# Patient Record
Sex: Male | Born: 1940 | Race: White | Hispanic: No | Marital: Married | Smoking: Former smoker
Health system: Southern US, Community
[De-identification: ages and names within clinical notes are randomized; demographics above are authoritative.]

## PROBLEM LIST (undated history)

## (undated) DIAGNOSIS — M1990 Unspecified osteoarthritis, unspecified site: Secondary | ICD-10-CM

## (undated) DIAGNOSIS — I060 Rheumatic aortic stenosis: Secondary | ICD-10-CM

## (undated) DIAGNOSIS — G4730 Sleep apnea, unspecified: Secondary | ICD-10-CM

## (undated) DIAGNOSIS — I10 Essential (primary) hypertension: Secondary | ICD-10-CM

## (undated) DIAGNOSIS — D72819 Decreased white blood cell count, unspecified: Secondary | ICD-10-CM

## (undated) DIAGNOSIS — I509 Heart failure, unspecified: Secondary | ICD-10-CM

## (undated) DIAGNOSIS — N289 Disorder of kidney and ureter, unspecified: Secondary | ICD-10-CM

## (undated) DIAGNOSIS — E119 Type 2 diabetes mellitus without complications: Secondary | ICD-10-CM

## (undated) DIAGNOSIS — I739 Peripheral vascular disease, unspecified: Secondary | ICD-10-CM

## (undated) HISTORY — PX: CORONARY ANGIOPLASTY WITH STENT PLACEMENT: SHX49

## (undated) HISTORY — PX: APPENDECTOMY: SHX54

## (undated) HISTORY — DX: Peripheral vascular disease, unspecified: I73.9

## (undated) HISTORY — DX: Essential (primary) hypertension: I10

## (undated) HISTORY — DX: Sleep apnea, unspecified: G47.30

## (undated) HISTORY — DX: Type 2 diabetes mellitus without complications: E11.9

## (undated) HISTORY — DX: Rheumatic aortic stenosis: I06.0

## (undated) HISTORY — DX: Unspecified osteoarthritis, unspecified site: M19.90

---

## 2010-01-21 ENCOUNTER — Ambulatory Visit: Payer: Self-pay | Admitting: Surgery

## 2010-01-23 ENCOUNTER — Ambulatory Visit: Payer: Self-pay | Admitting: Surgery

## 2010-11-18 ENCOUNTER — Ambulatory Visit: Payer: Self-pay | Admitting: Emergency Medicine

## 2011-05-12 ENCOUNTER — Ambulatory Visit: Payer: Self-pay | Admitting: Cardiovascular Disease

## 2011-08-19 ENCOUNTER — Ambulatory Visit: Payer: Self-pay | Admitting: Internal Medicine

## 2011-10-26 ENCOUNTER — Ambulatory Visit: Payer: Self-pay | Admitting: Physician Assistant

## 2011-11-11 ENCOUNTER — Ambulatory Visit: Payer: Self-pay | Admitting: Physician Assistant

## 2011-11-27 ENCOUNTER — Ambulatory Visit: Payer: Self-pay | Admitting: Unknown Physician Specialty

## 2012-02-22 ENCOUNTER — Ambulatory Visit: Payer: Self-pay | Admitting: Internal Medicine

## 2012-03-07 ENCOUNTER — Ambulatory Visit: Payer: Self-pay | Admitting: Internal Medicine

## 2012-04-06 ENCOUNTER — Ambulatory Visit: Payer: Self-pay | Admitting: Internal Medicine

## 2012-05-10 ENCOUNTER — Ambulatory Visit: Payer: Self-pay | Admitting: Unknown Physician Specialty

## 2012-10-28 ENCOUNTER — Ambulatory Visit: Payer: Self-pay | Admitting: Internal Medicine

## 2012-11-02 ENCOUNTER — Ambulatory Visit: Payer: Self-pay | Admitting: Internal Medicine

## 2012-11-02 LAB — CBC CANCER CENTER
Comment - H1-Com1: NORMAL
Comment - H1-Com2: NORMAL
Eosinophil: 4 %
HCT: 37.2 % — ABNORMAL LOW (ref 40.0–52.0)
MCH: 31.6 pg (ref 26.0–34.0)
MCHC: 35.7 g/dL (ref 32.0–36.0)
MCV: 89 fL (ref 80–100)
Monocytes: 12 %
RDW: 14.1 % (ref 11.5–14.5)
Segmented Neutrophils: 38 %
Variant Lymphocyte: 10 %

## 2012-11-02 LAB — RETICULOCYTES: Absolute Retic Count: 0.0364 10*6/uL (ref 0.031–0.129)

## 2012-11-02 LAB — IRON AND TIBC
Iron Saturation: 23 %
Iron: 92 ug/dL (ref 65–175)
Unbound Iron-Bind.Cap.: 313 ug/dL

## 2012-11-02 LAB — FOLATE: Folic Acid: 15.9 ng/mL (ref 3.1–100.0)

## 2012-11-03 LAB — PROT IMMUNOELECTROPHORES(ARMC)

## 2012-11-06 ENCOUNTER — Ambulatory Visit: Payer: Self-pay | Admitting: Internal Medicine

## 2012-12-07 ENCOUNTER — Ambulatory Visit: Payer: Self-pay | Admitting: Internal Medicine

## 2013-02-04 ENCOUNTER — Ambulatory Visit: Payer: Self-pay | Admitting: Internal Medicine

## 2013-02-08 LAB — CBC CANCER CENTER
Comment - H1-Com2: NORMAL
Eosinophil: 10 %
HGB: 13 g/dL (ref 13.0–18.0)
Lymphocytes: 31 %
MCH: 31.3 pg (ref 26.0–34.0)
MCHC: 35.7 g/dL (ref 32.0–36.0)
MCV: 88 fL (ref 80–100)
Monocytes: 10 %
Segmented Neutrophils: 45 %
Variant Lymphocyte: 4 %

## 2013-02-23 ENCOUNTER — Ambulatory Visit: Payer: Self-pay | Admitting: Cardiovascular Disease

## 2013-03-07 ENCOUNTER — Ambulatory Visit: Payer: Self-pay | Admitting: Internal Medicine

## 2013-04-06 ENCOUNTER — Ambulatory Visit: Payer: Self-pay | Admitting: Internal Medicine

## 2013-05-03 LAB — CBC CANCER CENTER
Basophil: 3 %
Comment - H1-Com3: NORMAL
HGB: 11.7 g/dL — ABNORMAL LOW (ref 13.0–18.0)
Lymphocytes: 34 %
MCH: 31 pg (ref 26.0–34.0)
Platelet: 156 x10 3/mm (ref 150–440)
RBC: 3.76 10*6/uL — ABNORMAL LOW (ref 4.40–5.90)
RDW: 13.1 % (ref 11.5–14.5)
Variant Lymphocyte: 2 %
WBC: 3.2 x10 3/mm — ABNORMAL LOW (ref 3.8–10.6)

## 2013-05-07 ENCOUNTER — Ambulatory Visit: Payer: Self-pay | Admitting: Internal Medicine

## 2013-05-10 DIAGNOSIS — D709 Neutropenia, unspecified: Secondary | ICD-10-CM | POA: Insufficient documentation

## 2013-05-10 LAB — CBC CANCER CENTER
Eosinophil: 9 %
HGB: 12.1 g/dL — ABNORMAL LOW (ref 13.0–18.0)
Lymphocytes: 35 %
MCH: 31.5 pg (ref 26.0–34.0)
MCHC: 35.9 g/dL (ref 32.0–36.0)
MCV: 88 fL (ref 80–100)
Monocytes: 9 %
RBC: 3.84 10*6/uL — ABNORMAL LOW (ref 4.40–5.90)
RDW: 13.4 % (ref 11.5–14.5)

## 2013-05-10 LAB — RETICULOCYTES: Reticulocyte: 1.91 %

## 2013-06-06 ENCOUNTER — Ambulatory Visit: Payer: Self-pay | Admitting: Internal Medicine

## 2014-06-06 ENCOUNTER — Ambulatory Visit: Payer: Self-pay | Admitting: Internal Medicine

## 2014-06-06 LAB — HEPATIC FUNCTION PANEL A (ARMC)
Albumin: 3.9 g/dL (ref 3.4–5.0)
Alkaline Phosphatase: 83 U/L
BILIRUBIN TOTAL: 0.5 mg/dL (ref 0.2–1.0)
Bilirubin, Direct: 0.1 mg/dL (ref 0.00–0.20)
SGOT(AST): 20 U/L (ref 15–37)
SGPT (ALT): 27 U/L (ref 12–78)
Total Protein: 7.4 g/dL (ref 6.4–8.2)

## 2014-06-06 LAB — CBC CANCER CENTER
BASOS ABS: 0 x10 3/mm (ref 0.0–0.1)
Basophil %: 1.8 %
EOS ABS: 0.2 x10 3/mm (ref 0.0–0.7)
Eosinophil %: 9.3 %
HCT: 36.5 % — AB (ref 40.0–52.0)
HGB: 12.5 g/dL — AB (ref 13.0–18.0)
LYMPHS ABS: 1.2 x10 3/mm (ref 1.0–3.6)
Lymphocyte %: 53 %
MCH: 30.5 pg (ref 26.0–34.0)
MCHC: 34.2 g/dL (ref 32.0–36.0)
MCV: 89 fL (ref 80–100)
MONOS PCT: 17.7 %
Monocyte #: 0.4 x10 3/mm (ref 0.2–1.0)
NEUTROS ABS: 0.4 x10 3/mm — AB (ref 1.4–6.5)
NEUTROS PCT: 18.2 %
Platelet: 158 x10 3/mm (ref 150–440)
RBC: 4.09 10*6/uL — ABNORMAL LOW (ref 4.40–5.90)
RDW: 13.6 % (ref 11.5–14.5)
WBC: 2.3 x10 3/mm — ABNORMAL LOW (ref 3.8–10.6)

## 2014-06-06 LAB — CREATININE, SERUM
CREATININE: 1.38 mg/dL — AB (ref 0.60–1.30)
EGFR (African American): 60 — ABNORMAL LOW
GFR CALC NON AF AMER: 51 — AB

## 2014-06-06 LAB — LACTATE DEHYDROGENASE: LDH: 193 U/L (ref 85–241)

## 2014-07-07 ENCOUNTER — Ambulatory Visit: Payer: Self-pay | Admitting: Internal Medicine

## 2014-07-09 LAB — CBC CANCER CENTER
BASOS ABS: 0.1 x10 3/mm (ref 0.0–0.1)
Basophil %: 2 %
EOS ABS: 0.3 x10 3/mm (ref 0.0–0.7)
Eosinophil %: 12.7 %
HCT: 33.6 % — ABNORMAL LOW (ref 40.0–52.0)
HGB: 11.4 g/dL — ABNORMAL LOW (ref 13.0–18.0)
Lymphocyte #: 1 x10 3/mm (ref 1.0–3.6)
Lymphocyte %: 38.2 %
MCH: 30.6 pg (ref 26.0–34.0)
MCHC: 33.9 g/dL (ref 32.0–36.0)
MCV: 90 fL (ref 80–100)
Monocyte #: 0.4 x10 3/mm (ref 0.2–1.0)
Monocyte %: 14 %
Neutrophil #: 0.8 x10 3/mm — ABNORMAL LOW (ref 1.4–6.5)
Neutrophil %: 33.1 %
Platelet: 148 x10 3/mm — ABNORMAL LOW (ref 150–440)
RBC: 3.71 10*6/uL — AB (ref 4.40–5.90)
RDW: 13.6 % (ref 11.5–14.5)
WBC: 2.5 x10 3/mm — AB (ref 3.8–10.6)

## 2014-08-07 ENCOUNTER — Ambulatory Visit: Payer: Self-pay | Admitting: Internal Medicine

## 2014-08-08 LAB — CBC CANCER CENTER
BASOS ABS: 0.1 x10 3/mm (ref 0.0–0.1)
BASOS PCT: 2.5 %
Eosinophil #: 0.2 x10 3/mm (ref 0.0–0.7)
Eosinophil %: 10.9 %
HCT: 36.7 % — ABNORMAL LOW (ref 40.0–52.0)
HGB: 12.4 g/dL — AB (ref 13.0–18.0)
LYMPHS ABS: 1 x10 3/mm (ref 1.0–3.6)
LYMPHS PCT: 46.1 %
MCH: 30.6 pg (ref 26.0–34.0)
MCHC: 33.8 g/dL (ref 32.0–36.0)
MCV: 91 fL (ref 80–100)
MONOS PCT: 18.8 %
Monocyte #: 0.4 x10 3/mm (ref 0.2–1.0)
Neutrophil #: 0.5 x10 3/mm — ABNORMAL LOW (ref 1.4–6.5)
Neutrophil %: 21.7 %
PLATELETS: 169 x10 3/mm (ref 150–440)
RBC: 4.06 10*6/uL — ABNORMAL LOW (ref 4.40–5.90)
RDW: 14 % (ref 11.5–14.5)
WBC: 2.1 x10 3/mm — ABNORMAL LOW (ref 3.8–10.6)

## 2014-09-05 LAB — CBC CANCER CENTER
BASOS ABS: 0.1 x10 3/mm (ref 0.0–0.1)
Basophil %: 2.3 %
EOS ABS: 0.3 x10 3/mm (ref 0.0–0.7)
EOS PCT: 14.2 %
HCT: 34.3 % — ABNORMAL LOW (ref 40.0–52.0)
HGB: 11.8 g/dL — AB (ref 13.0–18.0)
Lymphocyte #: 0.9 x10 3/mm — ABNORMAL LOW (ref 1.0–3.6)
Lymphocyte %: 42.1 %
MCH: 31.2 pg (ref 26.0–34.0)
MCHC: 34.5 g/dL (ref 32.0–36.0)
MCV: 91 fL (ref 80–100)
MONO ABS: 0.4 x10 3/mm (ref 0.2–1.0)
MONOS PCT: 16.3 %
NEUTROS PCT: 25.1 %
Neutrophil #: 0.5 x10 3/mm — ABNORMAL LOW (ref 1.4–6.5)
PLATELETS: 163 x10 3/mm (ref 150–440)
RBC: 3.79 10*6/uL — ABNORMAL LOW (ref 4.40–5.90)
RDW: 13.6 % (ref 11.5–14.5)
WBC: 2.2 x10 3/mm — AB (ref 3.8–10.6)

## 2014-09-06 ENCOUNTER — Ambulatory Visit: Payer: Self-pay | Admitting: Internal Medicine

## 2014-10-03 LAB — CBC CANCER CENTER
Basophil #: 0.1 x10 3/mm (ref 0.0–0.1)
Basophil %: 1 %
EOS PCT: 8.4 %
Eosinophil #: 0.4 x10 3/mm (ref 0.0–0.7)
HCT: 34.2 % — ABNORMAL LOW (ref 40.0–52.0)
HGB: 11.5 g/dL — AB (ref 13.0–18.0)
Lymphocyte #: 1.4 x10 3/mm (ref 1.0–3.6)
Lymphocyte %: 27.3 %
MCH: 30.2 pg (ref 26.0–34.0)
MCHC: 33.6 g/dL (ref 32.0–36.0)
MCV: 90 fL (ref 80–100)
MONO ABS: 0.4 x10 3/mm (ref 0.2–1.0)
Monocyte %: 7.3 %
NEUTROS ABS: 2.9 x10 3/mm (ref 1.4–6.5)
NEUTROS PCT: 56 %
PLATELETS: 209 x10 3/mm (ref 150–440)
RBC: 3.8 10*6/uL — AB (ref 4.40–5.90)
RDW: 13.6 % (ref 11.5–14.5)
WBC: 5.1 x10 3/mm (ref 3.8–10.6)

## 2014-10-07 ENCOUNTER — Ambulatory Visit: Payer: Self-pay | Admitting: Internal Medicine

## 2014-10-31 LAB — CBC CANCER CENTER
BASOS ABS: 0.1 x10 3/mm (ref 0.0–0.1)
Basophil %: 2.3 %
EOS PCT: 8.5 %
Eosinophil #: 0.2 x10 3/mm (ref 0.0–0.7)
HCT: 34.1 % — AB (ref 40.0–52.0)
HGB: 11.6 g/dL — ABNORMAL LOW (ref 13.0–18.0)
LYMPHS PCT: 40.7 %
Lymphocyte #: 1.2 x10 3/mm (ref 1.0–3.6)
MCH: 30.7 pg (ref 26.0–34.0)
MCHC: 34.2 g/dL (ref 32.0–36.0)
MCV: 90 fL (ref 80–100)
MONOS PCT: 10.6 %
Monocyte #: 0.3 x10 3/mm (ref 0.2–1.0)
NEUTROS PCT: 37.9 %
Neutrophil #: 1.1 x10 3/mm — ABNORMAL LOW (ref 1.4–6.5)
Platelet: 161 x10 3/mm (ref 150–440)
RBC: 3.8 10*6/uL — ABNORMAL LOW (ref 4.40–5.90)
RDW: 13.8 % (ref 11.5–14.5)
WBC: 2.9 x10 3/mm — AB (ref 3.8–10.6)

## 2014-11-06 ENCOUNTER — Ambulatory Visit: Payer: Self-pay | Admitting: Internal Medicine

## 2014-12-04 LAB — CBC CANCER CENTER
BASOS ABS: 0.1 x10 3/mm (ref 0.0–0.1)
Basophil %: 2.2 %
Eosinophil #: 0.2 x10 3/mm (ref 0.0–0.7)
Eosinophil %: 8 %
HCT: 34.6 % — ABNORMAL LOW (ref 40.0–52.0)
HGB: 11.7 g/dL — ABNORMAL LOW (ref 13.0–18.0)
LYMPHS ABS: 1.1 x10 3/mm (ref 1.0–3.6)
LYMPHS PCT: 38.4 %
MCH: 29.8 pg (ref 26.0–34.0)
MCHC: 33.9 g/dL (ref 32.0–36.0)
MCV: 88 fL (ref 80–100)
Monocyte #: 0.3 x10 3/mm (ref 0.2–1.0)
Monocyte %: 12.2 %
Neutrophil #: 1.1 x10 3/mm — ABNORMAL LOW (ref 1.4–6.5)
Neutrophil %: 39.2 %
PLATELETS: 176 x10 3/mm (ref 150–440)
RBC: 3.94 10*6/uL — ABNORMAL LOW (ref 4.40–5.90)
RDW: 13.9 % (ref 11.5–14.5)
WBC: 2.8 x10 3/mm — AB (ref 3.8–10.6)

## 2014-12-04 LAB — HEPATIC FUNCTION PANEL A (ARMC)
Albumin: 4 g/dL (ref 3.4–5.0)
Alkaline Phosphatase: 93 U/L
Bilirubin, Direct: 0.1 mg/dL (ref 0.0–0.2)
Bilirubin,Total: 0.5 mg/dL (ref 0.2–1.0)
SGOT(AST): 14 U/L — ABNORMAL LOW (ref 15–37)
SGPT (ALT): 24 U/L
TOTAL PROTEIN: 7.4 g/dL (ref 6.4–8.2)

## 2014-12-04 LAB — CREATININE, SERUM
CREATININE: 1.44 mg/dL — AB (ref 0.60–1.30)
EGFR (African American): >60
EGFR (Non-African Amer.): 51 — ABNORMAL LOW

## 2014-12-04 LAB — LACTATE DEHYDROGENASE: LDH: 188 U/L (ref 85–241)

## 2014-12-07 ENCOUNTER — Ambulatory Visit: Payer: Self-pay | Admitting: Internal Medicine

## 2015-03-26 ENCOUNTER — Ambulatory Visit
Admit: 2015-03-26 | Disposition: A | Discharge status: Home / Self Care | Payer: Self-pay | Attending: Internal Medicine | Admitting: Internal Medicine

## 2015-03-26 LAB — CBC CANCER CENTER
Basophil #: 0.1 x10 3/mm (ref 0.0–0.1)
Basophil %: 2.3 %
Eosinophil #: 0.5 x10 3/mm (ref 0.0–0.7)
Eosinophil %: 19.3 %
HCT: 34.7 % — ABNORMAL LOW (ref 40.0–52.0)
HGB: 11.8 g/dL — ABNORMAL LOW (ref 13.0–18.0)
Lymphocyte #: 1.2 x10 3/mm (ref 1.0–3.6)
Lymphocyte %: 44.4 %
MCH: 30.4 pg (ref 26.0–34.0)
MCHC: 34 g/dL (ref 32.0–36.0)
MCV: 89 fL (ref 80–100)
MONOS PCT: 15.8 %
Monocyte #: 0.4 x10 3/mm (ref 0.2–1.0)
NEUTROS PCT: 18.2 %
Neutrophil #: 0.5 x10 3/mm — ABNORMAL LOW (ref 1.4–6.5)
PLATELETS: 161 x10 3/mm (ref 150–440)
RBC: 3.88 10*6/uL — ABNORMAL LOW (ref 4.40–5.90)
RDW: 13.6 % (ref 11.5–14.5)
WBC: 2.7 x10 3/mm — ABNORMAL LOW (ref 3.8–10.6)

## 2015-04-02 LAB — CBC CANCER CENTER
BASOS PCT: 1.8 %
Basophil #: 0 x10 3/mm (ref 0.0–0.1)
EOS ABS: 0.3 x10 3/mm (ref 0.0–0.7)
Eosinophil %: 12.1 %
HCT: 33.7 % — AB (ref 40.0–52.0)
HGB: 11.6 g/dL — ABNORMAL LOW (ref 13.0–18.0)
Lymphocyte #: 1 x10 3/mm (ref 1.0–3.6)
Lymphocyte %: 44 %
MCH: 30.4 pg (ref 26.0–34.0)
MCHC: 34.3 g/dL (ref 32.0–36.0)
MCV: 89 fL (ref 80–100)
MONO ABS: 0.3 x10 3/mm (ref 0.2–1.0)
Monocyte %: 15.4 %
NEUTROS ABS: 0.6 x10 3/mm — AB (ref 1.4–6.5)
NEUTROS PCT: 26.7 %
Platelet: 155 x10 3/mm (ref 150–440)
RBC: 3.81 10*6/uL — ABNORMAL LOW (ref 4.40–5.90)
RDW: 13.6 % (ref 11.5–14.5)
WBC: 2.2 x10 3/mm — ABNORMAL LOW (ref 3.8–10.6)

## 2015-07-16 ENCOUNTER — Other Ambulatory Visit: Payer: Self-pay | Admitting: *Deleted

## 2015-07-16 ENCOUNTER — Inpatient Hospital Stay: Payer: Medicare HMO | Attending: Internal Medicine

## 2015-07-16 DIAGNOSIS — D72819 Decreased white blood cell count, unspecified: Secondary | ICD-10-CM

## 2015-07-16 DIAGNOSIS — D709 Neutropenia, unspecified: Secondary | ICD-10-CM

## 2015-07-16 DIAGNOSIS — D649 Anemia, unspecified: Secondary | ICD-10-CM

## 2015-07-16 LAB — CBC WITH DIFFERENTIAL/PLATELET
Basophils Absolute: 0.1 10*3/uL (ref 0–0.1)
Basophils Relative: 2 %
Eosinophils Absolute: 0.4 10*3/uL (ref 0–0.7)
Eosinophils Relative: 15 %
HCT: 34.2 % — ABNORMAL LOW (ref 40.0–52.0)
Hemoglobin: 11.9 g/dL — ABNORMAL LOW (ref 13.0–18.0)
Lymphocytes Relative: 45 %
Lymphs Abs: 1 10*3/uL (ref 1.0–3.6)
MCH: 31.1 pg (ref 26.0–34.0)
MCHC: 34.8 g/dL (ref 32.0–36.0)
MCV: 89.3 fL (ref 80.0–100.0)
MONO ABS: 0.5 10*3/uL (ref 0.2–1.0)
MONOS PCT: 20 %
NEUTROS ABS: 0.4 10*3/uL — AB (ref 1.4–6.5)
Neutrophils Relative %: 18 %
Platelets: 168 10*3/uL (ref 150–440)
RBC: 3.83 MIL/uL — ABNORMAL LOW (ref 4.40–5.90)
RDW: 13.8 % (ref 11.5–14.5)
WBC: 2.3 10*3/uL — ABNORMAL LOW (ref 3.8–10.6)

## 2015-07-22 ENCOUNTER — Ambulatory Visit: Payer: Medicare HMO

## 2015-07-23 ENCOUNTER — Inpatient Hospital Stay: Payer: Medicare HMO

## 2015-07-23 ENCOUNTER — Inpatient Hospital Stay: Payer: Medicare HMO | Admitting: Internal Medicine

## 2015-07-23 ENCOUNTER — Other Ambulatory Visit: Payer: Self-pay | Admitting: *Deleted

## 2015-07-23 VITALS — BP 117/65 | HR 71 | Temp 99.2°F | Resp 18

## 2015-07-23 DIAGNOSIS — D72819 Decreased white blood cell count, unspecified: Secondary | ICD-10-CM

## 2015-07-23 DIAGNOSIS — D709 Neutropenia, unspecified: Secondary | ICD-10-CM

## 2015-07-23 LAB — CBC
HEMATOCRIT: 33.6 % — AB (ref 40.0–52.0)
HEMOGLOBIN: 11.7 g/dL — AB (ref 13.0–18.0)
MCH: 30.8 pg (ref 26.0–34.0)
MCHC: 34.9 g/dL (ref 32.0–36.0)
MCV: 88.3 fL (ref 80.0–100.0)
Platelets: 170 10*3/uL (ref 150–440)
RBC: 3.8 MIL/uL — AB (ref 4.40–5.90)
RDW: 13.3 % (ref 11.5–14.5)
WBC: 3.1 10*3/uL — ABNORMAL LOW (ref 3.8–10.6)

## 2015-07-23 LAB — RETICULOCYTES
RBC.: 3.8 MIL/uL — AB (ref 4.40–5.90)
Retic Count, Absolute: 38 10*3/uL (ref 19.0–183.0)
Retic Ct Pct: 1 % (ref 0.4–3.1)

## 2015-07-23 MED ORDER — HEPARIN SOD (PORK) LOCK FLUSH 100 UNIT/ML IV SOLN
INTRAVENOUS | Status: AC
  Filled 2015-07-23: qty 5

## 2015-07-23 NOTE — Progress Notes (Signed)
Bone Marrow Procedure Note Indication:  Persistent leukopenia/neutropenia, evaluate for myelodysplastic syndrome.  Procedure explained and consent obtained. Under strict aseptic precautions, area was cleaned with Betadine and draped. 1% Lidocaine local anesthetic was given and a bone marrow aspirate and biopsy samples were taken from the right posterior superior iliac crest. Aspirate samples and second core biopsy sample also drawn for flow cytometry and cytogenetics. Patient tolerated procedure well, no complications noted.

## 2015-07-24 ENCOUNTER — Other Ambulatory Visit: Payer: Self-pay

## 2015-07-24 DIAGNOSIS — D72819 Decreased white blood cell count, unspecified: Secondary | ICD-10-CM

## 2015-07-24 DIAGNOSIS — D709 Neutropenia, unspecified: Secondary | ICD-10-CM

## 2015-07-24 LAB — DIFFERENTIAL
BAND NEUTROPHILS: 4 % (ref 0–10)
BLASTS: 0 %
Basophils Relative: 1 % (ref 0–1)
Eosinophils Relative: 16 % — ABNORMAL HIGH (ref 0–5)
LYMPHS PCT: 24 % (ref 12–46)
MYELOCYTES: 0 %
Metamyelocytes Relative: 0 %
Monocytes Relative: 11 % (ref 3–12)
NEUTROS PCT: 44 % (ref 43–77)
NRBC: 0 /100{WBCs}
Other: 0 %
PROMYELOCYTES ABS: 0 %

## 2015-07-25 ENCOUNTER — Ambulatory Visit
Admission: RE | Admit: 2015-07-25 | Discharge: 2015-07-25 | Disposition: A | Discharge status: Home / Self Care | Payer: Medicare HMO | Source: Ambulatory Visit | Attending: Internal Medicine | Admitting: Internal Medicine

## 2015-07-25 DIAGNOSIS — D709 Neutropenia, unspecified: Secondary | ICD-10-CM | POA: Diagnosis not present

## 2015-08-06 ENCOUNTER — Inpatient Hospital Stay (HOSPITAL_BASED_OUTPATIENT_CLINIC_OR_DEPARTMENT_OTHER): Payer: Medicare HMO | Admitting: Internal Medicine

## 2015-08-06 VITALS — BP 133/76 | HR 71 | Temp 97.7°F | Resp 18

## 2015-08-06 DIAGNOSIS — D709 Neutropenia, unspecified: Secondary | ICD-10-CM

## 2015-08-06 DIAGNOSIS — D649 Anemia, unspecified: Secondary | ICD-10-CM

## 2015-08-06 MED ORDER — ACYCLOVIR 400 MG PO TABS: 400.0000 mg | ORAL_TABLET | Freq: Two times a day (BID) | ORAL | Status: DC

## 2015-08-23 NOTE — Progress Notes (Signed)
[place]  [phone] Fax:([phone]     ID: [name] OB: Jan 19, 1941  MR#: [id number]  CSN#:[id number]  Patient Care Team: Jodi Marble, MD as PCP - General (Internal Medicine)  CHIEF COMPLAINT/DIAGNOSIS:  Leukopenia/Neutropenia, Mild Anemia - Mild, asymptomatic, of unclear etiology. Workup so far unremarkable as detailed below.  Bone marrow biopsy 05/10/13 - no diagnostic morphologic evidence of B-cell lymphoproliferative disorder or other hematopoietic neoplasia identified.  Normocellular to slightly hypercellular marrow for age 31-50% with maturing trilineage hematopoiesis, storage iron present, slight patchy increase in reticulin  Flow study negative for any B-cell monoclonality, nonspecific myeloid and normocytic findings with no increase in blasts,relatively increased eosinophils 7% is nonspecific,relatively increased normal B-cell precursors (hematogones)and.  Normal male karyotype, 46XY.  Labs done on 11/02/12 - Hb 13.3, platelets 172K, retic 0.036, WBC 3400 with 38% neutrophils, 35% lymphocytes, 10% variant lymphocytes, 12% monocytes. Iron study, B12, folate, ANA, serum immunoelectrophoresis (SIEP), PT, PTT, LDH, haptoglobin, Coombs test, HBsAg, HCV antibody, and HIV antibody all unremarkable.   05/03/13 - Peripheral blood Flow Cytometry. A very small (0.3% of leukocytes) B cell clone is detected. Previous phenotyping results showed similar findings. The clone is very small and is not diagnostic of any specific type of lymphoma. If the patient has a previous history of lymphoma, the finding is consistent with persistent involvement by lymphoma. 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  07/23/15 -  Bone Marrow Biopsy Report  -  nonspecific marrow findings with no diagnostic morphologic or immunophenotypic evidence of hematopoietic neoplasia. Mildly hypercellular marrow for age (40-50%) with myeloid hypoplasia,  increased eosinophils, erythroid hyperplasia with mild nonspecific dyserythropoiesis and mild megakaryocytic atypia. Diffuse mild to focally moderate increase in reticulin. Storage iron present. Flow cytometry reports relatively decreased neutrophilic cells with no immunophenotypic abnormalities or increase in blasts, relatively increased eosinophils (17%) nonspecific. Cytogenetics normal 46XY. SNP microarray result is Normal Male.   HISTORY OF PRESENT ILLNESS:  Patient returns for continued hematology follow-up. Bone marrow biopsy was done on August 16 as described above. Clinically doing well, denies fevers or symptoms of infection. Eating steady. No new bone pains.  REVIEW OF SYSTEMS:   ROS As in HPI above. In addition, no new headaches or focal weakness.  No sore throat, cough, shortness of breath, sputum, hemoptysis or chest pain. No abdominal pain, constipation, diarrhea, dysuria or hematuria.   PAST MEDICAL HISTORY: Reviewed.         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 As above.  FAMILY HISTORY: Reviewed. Remarkable for diabetes, heart disease, hypertension, breast cancer.   SOCIAL HISTORY: Reviewed. Nonsmoker.  Denies alcohol or recreational drug usage.    Allergies  Allergen Reactions  . Pollen Extract Other (See Comments)    Runny nose    Current Outpatient Prescriptions  Medication Sig Dispense Refill  . acyclovir (ZOVIRAX) 400 MG tablet Take 1 tablet (400 mg total) by mouth 2 (two) times daily. 60 tablet 5  . FLUARIX QUADRIVALENT 0.5 ML injection inject 0.5 milliliter intramuscularly  0  . rosuvastatin (CRESTOR) 40 MG tablet   0   No current facility-administered medications for this visit.    PHYSICAL EXAM: Filed Vitals:   08/06/15 1444  BP: 133/76    Pulse: 71  Temp: 97.7 F (36.5 C)  Resp:  18     There is no height or weight on file to calculate BMI.     GENERAL: Patient is alert and oriented and in no acute distress. There is no icterus. LUNGS: Bilaterally clear to auscultation, no rhonchi. ABDOMEN: Soft, nontender. No hepatosplenomegaly clinically.  EXTREMITIES: No pedal edema.   LAB RESULTS:    Component Value Date/Time   CREATININE 1.44* 12/04/2014 1407   PROT 7.4 12/04/2014 1407   ALBUMIN 4.0 12/04/2014 1407   AST 14* 12/04/2014 1407   ALT 24 12/04/2014 1407   ALKPHOS 93 12/04/2014 1407   BILITOT 0.5 12/04/2014 1407   GFRNONAA 51* 12/04/2014 1407   GFRNONAA 51* 06/06/2014 1017   GFRAA >60 12/04/2014 1407   GFRAA 60* 06/06/2014 1017    Lab Results  Component Value Date   WBC 3.1* 07/23/2015   NEUTROABS 0.4* 07/16/2015   HGB 11.7* 07/23/2015   HCT 33.6* 07/23/2015   MCV 88.3 07/23/2015   PLT 170 07/23/2015  ANC ~1200.  STUDIES: US Abdomen Complete  07/25/2015   CLINICAL DATA:  Chronic neutropenia.  EXAM: ULTRASOUND ABDOMEN COMPLETE  COMPARISON:  05/10/2013  FINDINGS: Gallbladder: No gallstones or wall thickening visualized. No sonographic Murphy sign noted.  Common bile duct: Diameter: 3 mm, within normal limits.  Liver: No focal lesion identified. Within normal limits in parenchymal echogenicity.  IVC: No abnormality visualized.  Pancreas: Visualized portion unremarkable.  Spleen: Length measures 12.5 cm with estimated volume of 313 mL, which is within normal limits.  Right Kidney: Length: 10.5 cm. Echogenicity within normal limits. No mass or hydronephrosis visualized.  Left Kidney: Length: 13.8 cm. Echogenicity within normal limits. No mass or hydronephrosis visualized.  Abdominal aorta: No aneurysm visualized.  Other findings: None.  IMPRESSION: Negative abdominal ultrasound. Spleen is in the upper normal size range.   Electronically Signed   By: Earle Gell M.D.   On: 07/25/2015 08:53   07/23/15 -  Bone Marrow  Biopsy Report  -  nonspecific marrow findings with no diagnostic morphologic or immunophenotypic evidence of hematopoietic neoplasia. Mildly hypercellular marrow for age (40-50%) with myeloid hypoplasia, increased eosinophils, erythroid hyperplasia with mild nonspecific dyserythropoiesis and mild megakaryocytic atypia. Diffuse mild to focally moderate increase in reticulin. Storage iron present. Flow cytometry reports relatively decreased neutrophilic cells with no immunophenotypic abnormalities or increase in blasts, relatively increased eosinophils (17%) nonspecific. Cytogenetics normal 46XY. SNP microarray result is Normal Male.    ASSESSMENT / PLAN:   1. Leukopenia/Neutropenia  -  Unclear etiology.  Peripheral blood Flow Cytometry showed a very small (0.3% of leukocytes) B cell clone, previous phenotyping results showed similar findings, the clone is very small and is not diagnostic of any specific type of lymphoma.  Bone marrow biopsy repeated on 07/23/15 again shows nonspecific marrow findings with no diagnostic morphologic or immunophenotypic evidence of hematopoietic neoplasia. CBC on that date showed that Gravity has improved back up to just below normal range at around 1200. Patient is clinically doing well with no fevers or infections. Plan is continued surveillance in case he develops worsening of blood counts in the future. We will order CBC and differential count to be checked once every 6 weeks, next MD followup at 24 weeks with labs and make continued treatment planning.   2. Anemia - mild, no symptoms referable to this. No intervention needed at this time, continue to monitor Hb q6 weeks.   3. In between visits, the patient has been advised to call or come to the ER in case  of fevers, chills, bleeding, acute sickness or new symptoms and will be evaluated sooner. Patient is agreeable to this plan.     Leia Alf, MD   08/23/2015 1:22 PM

## 2015-09-17 ENCOUNTER — Inpatient Hospital Stay: Payer: Medicare HMO | Attending: Internal Medicine

## 2015-10-16 ENCOUNTER — Telehealth: Payer: Self-pay | Admitting: *Deleted

## 2015-10-16 NOTE — Telephone Encounter (Signed)
Called to say he missed his 10/11 appt and has an appt on 11/22, asking does he just forget about the Oct appt or does the Nov appt need to be moved up?

## 2015-10-17 NOTE — Telephone Encounter (Signed)
I checked with Dr. Donneta RombergBrahmanday and he said his wbc has not changed much so waiting til next week is ok. . Pt thought that would be the case and he will be there.

## 2015-10-29 ENCOUNTER — Inpatient Hospital Stay: Payer: Medicare HMO | Attending: Internal Medicine

## 2015-10-29 ENCOUNTER — Telehealth: Payer: Self-pay | Admitting: *Deleted

## 2015-10-29 DIAGNOSIS — I10 Essential (primary) hypertension: Secondary | ICD-10-CM | POA: Insufficient documentation

## 2015-10-29 DIAGNOSIS — D72819 Decreased white blood cell count, unspecified: Secondary | ICD-10-CM | POA: Diagnosis not present

## 2015-10-29 DIAGNOSIS — I739 Peripheral vascular disease, unspecified: Secondary | ICD-10-CM | POA: Diagnosis not present

## 2015-10-29 DIAGNOSIS — I2510 Atherosclerotic heart disease of native coronary artery without angina pectoris: Secondary | ICD-10-CM | POA: Insufficient documentation

## 2015-10-29 DIAGNOSIS — Z79899 Other long term (current) drug therapy: Secondary | ICD-10-CM | POA: Diagnosis not present

## 2015-10-29 DIAGNOSIS — D709 Neutropenia, unspecified: Secondary | ICD-10-CM

## 2015-10-29 DIAGNOSIS — G4730 Sleep apnea, unspecified: Secondary | ICD-10-CM | POA: Diagnosis not present

## 2015-10-29 DIAGNOSIS — M1612 Unilateral primary osteoarthritis, left hip: Secondary | ICD-10-CM | POA: Diagnosis not present

## 2015-10-29 DIAGNOSIS — D649 Anemia, unspecified: Secondary | ICD-10-CM | POA: Insufficient documentation

## 2015-10-29 LAB — CBC WITH DIFFERENTIAL/PLATELET
Basophils Absolute: 0 10*3/uL (ref 0–0.1)
Basophils Relative: 2 %
EOS PCT: 11 %
Eosinophils Absolute: 0.2 10*3/uL (ref 0–0.7)
HCT: 34.1 % — ABNORMAL LOW (ref 40.0–52.0)
Hemoglobin: 12 g/dL — ABNORMAL LOW (ref 13.0–18.0)
LYMPHS ABS: 1.1 10*3/uL (ref 1.0–3.6)
LYMPHS PCT: 52 %
MCH: 30.7 pg (ref 26.0–34.0)
MCHC: 35.1 g/dL (ref 32.0–36.0)
MCV: 87.5 fL (ref 80.0–100.0)
MONO ABS: 0.4 10*3/uL (ref 0.2–1.0)
Monocytes Relative: 18 %
Neutro Abs: 0.4 10*3/uL — ABNORMAL LOW (ref 1.4–6.5)
Neutrophils Relative %: 17 %
PLATELETS: 183 10*3/uL (ref 150–440)
RBC: 3.9 MIL/uL — AB (ref 4.40–5.90)
RDW: 13.6 % (ref 11.5–14.5)
WBC: 2.1 10*3/uL — ABNORMAL LOW (ref 3.8–10.6)

## 2015-10-29 MED ORDER — LEVOFLOXACIN 500 MG PO TABS: 500.0000 mg | ORAL_TABLET | Freq: Every day | ORAL | Status: DC

## 2015-10-29 MED ORDER — FLUCONAZOLE 200 MG PO TABS: 200.0000 mg | ORAL_TABLET | Freq: Every day | ORAL | Status: DC

## 2015-10-29 NOTE — Addendum Note (Signed)
Addended by: Ivar DrapeELLINGTON, Berit Raczkowski C on: 10/29/2015 04:33 PM   Modules accepted: Orders, Medications

## 2015-10-29 NOTE — Telephone Encounter (Signed)
Per Dr. Donneta RombergBrahmanday, have Dr. Gretel AcreBerenzon handle the critical value and recommendations as Dr. Donneta RombergBrahmanday is not on call. I contacted Steward DroneBrenda, RN in triage who will give Dr. Gretel AcreBerenzon the message.

## 2015-10-29 NOTE — Telephone Encounter (Signed)
Per Dr Gretel AcreBerenzon Levaquin 500 mg daily continuously and Fluconazole 200 mg daily continuously. Patient can be put on Neupogen see if he wants to come in to discuss other options.  I attempted to call patient on mobile and home phone and had to leave a message on answering machine at home phone to please call me back

## 2015-10-29 NOTE — Addendum Note (Signed)
Addended by: Valinda HoarELLINGTON, Mohanad Carsten C on: 10/29/2015 04:00 PM   Modules accepted: Orders, Medications

## 2015-10-29 NOTE — Telephone Encounter (Signed)
Critical ANC - 0.4  MD notified. 

## 2015-10-29 NOTE — Telephone Encounter (Signed)
Patient informed of 2 rx sent in a dn states he will pick up, he has agreed to an appt Monday at 9AM to see Dr Gretel AcreBerenzon

## 2015-11-04 ENCOUNTER — Inpatient Hospital Stay: Payer: Medicare HMO

## 2015-11-04 ENCOUNTER — Inpatient Hospital Stay (HOSPITAL_BASED_OUTPATIENT_CLINIC_OR_DEPARTMENT_OTHER): Payer: Medicare HMO | Admitting: Internal Medicine

## 2015-11-04 VITALS — BP 145/67 | HR 67 | Temp 97.2°F | Resp 18 | Ht 73.0 in | Wt 212.7 lb

## 2015-11-04 DIAGNOSIS — D72819 Decreased white blood cell count, unspecified: Secondary | ICD-10-CM | POA: Diagnosis not present

## 2015-11-04 DIAGNOSIS — D649 Anemia, unspecified: Secondary | ICD-10-CM | POA: Diagnosis not present

## 2015-11-04 DIAGNOSIS — D709 Neutropenia, unspecified: Secondary | ICD-10-CM

## 2015-11-04 DIAGNOSIS — G4730 Sleep apnea, unspecified: Secondary | ICD-10-CM

## 2015-11-04 DIAGNOSIS — I2510 Atherosclerotic heart disease of native coronary artery without angina pectoris: Secondary | ICD-10-CM | POA: Diagnosis not present

## 2015-11-04 DIAGNOSIS — I10 Essential (primary) hypertension: Secondary | ICD-10-CM

## 2015-11-04 DIAGNOSIS — M1612 Unilateral primary osteoarthritis, left hip: Secondary | ICD-10-CM

## 2015-11-04 DIAGNOSIS — Z79899 Other long term (current) drug therapy: Secondary | ICD-10-CM

## 2015-11-04 DIAGNOSIS — I739 Peripheral vascular disease, unspecified: Secondary | ICD-10-CM

## 2015-11-04 LAB — CBC WITH DIFFERENTIAL/PLATELET
Basophils Absolute: 0 10*3/uL (ref 0–0.1)
Basophils Relative: 2 %
EOS PCT: 13 %
Eosinophils Absolute: 0.3 10*3/uL (ref 0–0.7)
HCT: 36.5 % — ABNORMAL LOW (ref 40.0–52.0)
Hemoglobin: 12.6 g/dL — ABNORMAL LOW (ref 13.0–18.0)
LYMPHS ABS: 1.1 10*3/uL (ref 1.0–3.6)
LYMPHS PCT: 49 %
MCH: 30.4 pg (ref 26.0–34.0)
MCHC: 34.4 g/dL (ref 32.0–36.0)
MCV: 88.4 fL (ref 80.0–100.0)
MONO ABS: 0.4 10*3/uL (ref 0.2–1.0)
Monocytes Relative: 19 %
Neutro Abs: 0.4 10*3/uL — ABNORMAL LOW (ref 1.4–6.5)
Neutrophils Relative %: 17 %
PLATELETS: 181 10*3/uL (ref 150–440)
RBC: 4.13 MIL/uL — ABNORMAL LOW (ref 4.40–5.90)
RDW: 13.9 % (ref 11.5–14.5)
WBC: 2.1 10*3/uL — ABNORMAL LOW (ref 3.8–10.6)

## 2015-11-04 MED ORDER — PREDNISONE 50 MG PO TABS: ORAL_TABLET | ORAL | Status: DC

## 2015-11-04 NOTE — Progress Notes (Signed)
[place]  Telephone:(336) [phone] [phone]) [phone]     ID: [name] OB: 04/09/1941  MR#: [id number]  CSN#:[id number]  Patient Care Team: Jodi Marble, MD as PCP - General (Internal Medicine)  CHIEF COMPLAINT/DIAGNOSIS:  Leukopenia/Neutropenia, Mild Anemia - Mild, asymptomatic, of unclear etiology. Workup so far unremarkable as detailed below.  Bone marrow biopsy 05/10/13 - no diagnostic morphologic evidence of B-cell lymphoproliferative disorder or other hematopoietic neoplasia identified.  Normocellular to slightly hypercellular marrow for age 21-50% with maturing trilineage hematopoiesis, storage iron present, slight patchy increase in reticulin  Flow study negative for any B-cell monoclonality, nonspecific myeloid and normocytic findings with no increase in blasts,relatively increased eosinophils 7% is nonspecific,relatively increased normal B-cell precursors (hematogones)and.  Normal male karyotype, 46XY.  Labs done on 11/02/12 - Hb 13.3, platelets 172K, retic 0.036, WBC 3400 with 38% neutrophils, 35% lymphocytes, 10% variant lymphocytes, 12% monocytes. Iron study, B12, folate, ANA, serum immunoelectrophoresis (SIEP), PT, PTT, LDH, haptoglobin, Coombs test, HBsAg, HCV antibody, and HIV antibody all unremarkable.   05/03/13 - Peripheral blood Flow Cytometry. A very small (0.3% of leukocytes) B cell clone is detected. Previous phenotyping results showed similar findings. The clone is very small and is not diagnostic of any specific type of lymphoma. If the patient has a previous history of lymphoma, the finding is consistent with persistent involvement by lymphoma. 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  07/23/15 -  Bone Marrow Biopsy Report  -  nonspecific marrow findings with no diagnostic morphologic or immunophenotypic evidence of hematopoietic neoplasia. Mildly hypercellular marrow for age (40-50%) with myeloid hypoplasia,  increased eosinophils, erythroid hyperplasia with mild nonspecific dyserythropoiesis and mild megakaryocytic atypia. Diffuse mild to focally moderate increase in reticulin. Storage iron present. Flow cytometry reports relatively decreased neutrophilic cells with no immunophenotypic abnormalities or increase in blasts, relatively increased eosinophils (17%) nonspecific. Cytogenetics normal 46XY. SNP microarray result is Normal Male.   HISTORY OF PRESENT ILLNESS:  I asked Mr. Aeschliman to come to our clinic to discuss future plan of action. He continues to be deeply neutropenic, but, remarkably, has not developed any significant infection yet. Last week we added fluconazole and Levaquin to his acyclovir, and he notices disappearance of chronic rash in the perianal area and inner thigh. Otherwise he continues to feel well  REVIEW OF SYSTEMS:   ROS As in HPI above. In addition, no new headaches or focal weakness.  No sore throat, cough, shortness of breath, sputum, hemoptysis or chest pain. No abdominal pain, constipation, diarrhea, dysuria or hematuria.   PAST MEDICAL HISTORY: Reviewed.         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 As above.  FAMILY HISTORY: Reviewed. Remarkable for diabetes, heart disease, hypertension, breast cancer.   SOCIAL HISTORY: Reviewed. Nonsmoker.  Denies alcohol or recreational drug usage.    Allergies  Allergen Reactions  . Pollen Extract Other (See Comments)    Runny nose    Current Outpatient Prescriptions  Medication Sig Dispense Refill  . acyclovir (ZOVIRAX) 400 MG tablet Take 1 tablet (400 mg total) by mouth 2 (two) times daily. 60 tablet 5  . fluconazole (DIFLUCAN) 200 MG tablet Take 1 tablet (200 mg total) by mouth daily. 30 tablet 2  .  levofloxacin (LEVAQUIN) 500 MG tablet Take 1 tablet (500  mg total) by mouth daily. 30 tablet 2  . rosuvastatin (CRESTOR) 40 MG tablet   0   No current facility-administered medications for this visit.    PHYSICAL EXAM: Filed Vitals:   11/04/15 0915  BP: 145/67  Pulse: 67  Temp: 97.2 F (36.2 C)  Resp: 18     Body mass index is 28.07 kg/(m^2).     BP 145/67 mmHg  Pulse 67  Temp(Src) 97.2 F (36.2 C) (Tympanic)  Resp 18  Ht _0  (1.854 m)  Wt 212 lb 11.9 oz (96.5 kg)  BMI 28.07 kg/m2  General Appearance:    Alert, cooperative, no distress, appears stated age  Head:    Normocephalic, without obvious abnormality, atraumatic  Eyes:    PERRL, conjunctiva/corneas clear, EOM's intact, fundi    benign, both eyes       Ears:    Normal TM's and external ear canals, both ears  Nose:   Nares normal, septum midline, mucosa normal, no drainage   or sinus tenderness  Throat:   Lips, mucosa, and tongue normal; teeth and gums normal  Neck:   Supple, symmetrical, trachea midline, no adenopathy;       thyroid:  No enlargement/tenderness/nodules; carotid   bruit on L  Back:     Symmetric, no curvature, ROM normal, no CVA tenderness  Lungs:     Clear to auscultation bilaterally, respirations unlabored  Chest wall:    No tenderness or deformity  Heart:    Regular rate and rhythm, S1 and S2 normal, systolic ejection murmur 3 out of 6 on aorta   Abdomen:     Soft, non-tender, bowel sounds active all four quadrants,    no masses, no organomegaly  Extremities:   Extremities normal, atraumatic, no cyanosis or edema  Pulses:   2+ and symmetric all extremities  Skin:   Skin color, texture, turgor normal, no rashes or lesions  Lymph nodes:   Cervical, supraclavicular, and axillary nodes normal  Neurologic:   CNII-XII intact. Normal strength, sensation and reflexes      throughout     LAB RESULTS: Recent Results (from the past 2160 hour(s))  CBC with Differential     Status: Abnormal    Collection Time: 10/29/15  2:52 PM  Result Value Ref Range   WBC 2.1 (L) 3.8 - 10.6 K/uL   RBC 3.90 (L) 4.40 - 5.90 MIL/uL   Hemoglobin 12.0 (L) 13.0 - 18.0 g/dL   HCT 34.1 (L) 40.0 - 52.0 %   MCV 87.5 80.0 - 100.0 fL   MCH 30.7 26.0 - 34.0 pg   MCHC 35.1 32.0 - 36.0 g/dL   RDW 13.6 11.5 - 14.5 %   Platelets 183 150 - 440 K/uL   Neutrophils Relative % 17 %   Neutro Abs 0.4 (L) 1.4 - 6.5 K/uL    Comment: RESULT REPEATED AND VERIFIED CRITICAL RESULT CALLED TO, READ BACK BY AND VERIFIED WITH: ANITA BLACK AT 1504 10/29/2015 K[facility]    Lymphocytes Relative 52 %   Lymphs Abs 1.1 1.0 - 3.6 K/uL   Monocytes Relative 18 %   Monocytes Absolute 0.4 0.2 - 1.0 K/uL   Eosinophils Relative 11 %   Eosinophils Absolute 0.2 0 - 0.7 K/uL   Basophils Relative 2 %   Basophils Absolute 0.0 0 - 0.1 K/uL     STUDIES: No results found. 07/23/15 -  Bone Marrow Biopsy Report  -  nonspecific marrow findings with no diagnostic morphologic or immunophenotypic evidence of  hematopoietic neoplasia. Mildly hypercellular marrow for age (40-50%) with myeloid hypoplasia, increased eosinophils, erythroid hyperplasia with mild nonspecific dyserythropoiesis and mild megakaryocytic atypia. Diffuse mild to focally moderate increase in reticulin. Storage iron present. Flow cytometry reports relatively decreased neutrophilic cells with no immunophenotypic abnormalities or increase in blasts, relatively increased eosinophils (17%) nonspecific. Cytogenetics normal 46XY. SNP microarray result is Normal Male.    ASSESSMENT / PLAN:   1. Leukopenia/Neutropenia  -  reason for neutropenia is still unclear. Extensive workup, including 2 bone marrow biopsies did not reveal any evidence of hematologic malignancy, although there were signs of mild atypia in red blood cells and megakaryocytes. We will look for signs of LGL leukemia, by performing an T-cell receptor gene rearrangement, but in the meantime we will try to evaluate him for  autoimmune mechanism of neutropenia. We will give prednisone at 0.5 mg/kg (average 50 mg a day) for 5 days, and then see him back in the clinic and check CBC. If there is no response to prednisone, we will consider Neupogen to keep Homer above thousand cells per microliter. If there is a response to steroids, we will monitor the duration of it, and if ANC starts to fall again, we'll consider reinduction with rapid taper and maintenance low-dose steroids. For now Mr. Nihiser will continue fluconazole, acyclovir, Levaquin.  2. Anemia - mild, no symptoms referable to this. No intervention needed at this time, continue to monitor    3. In between visits, the patient has been advised to call or come to the ER in case of fevers, chills, bleeding, acute sickness or new symptoms and will be evaluated sooner. Patient is agreeable to this plan.   I spent 25 minutes with Mr. Winchell, with more than 50% of the time spent on organizing care and counseling. Return to the clinic in 5 days  Roxana Hires, MD   11/04/2015 9:03 AM

## 2015-11-04 NOTE — Progress Notes (Signed)
Pt does not have fevers, any sickness.  Pt feels good.

## 2015-11-05 ENCOUNTER — Ambulatory Visit: Payer: Self-pay | Admitting: Internal Medicine

## 2015-11-05 ENCOUNTER — Other Ambulatory Visit: Payer: Self-pay

## 2015-11-08 ENCOUNTER — Other Ambulatory Visit: Payer: Self-pay | Admitting: *Deleted

## 2015-11-08 ENCOUNTER — Inpatient Hospital Stay (HOSPITAL_BASED_OUTPATIENT_CLINIC_OR_DEPARTMENT_OTHER): Payer: Medicare HMO | Admitting: Internal Medicine

## 2015-11-08 ENCOUNTER — Inpatient Hospital Stay: Payer: Medicare HMO | Attending: Internal Medicine

## 2015-11-08 ENCOUNTER — Other Ambulatory Visit: Payer: Self-pay | Admitting: Hematology and Oncology

## 2015-11-08 VITALS — BP 158/79 | HR 72 | Temp 97.1°F | Resp 18 | Ht 73.0 in | Wt 211.4 lb

## 2015-11-08 DIAGNOSIS — D649 Anemia, unspecified: Secondary | ICD-10-CM | POA: Diagnosis not present

## 2015-11-08 DIAGNOSIS — D72819 Decreased white blood cell count, unspecified: Secondary | ICD-10-CM

## 2015-11-08 DIAGNOSIS — G4730 Sleep apnea, unspecified: Secondary | ICD-10-CM | POA: Diagnosis not present

## 2015-11-08 DIAGNOSIS — D696 Thrombocytopenia, unspecified: Secondary | ICD-10-CM | POA: Diagnosis not present

## 2015-11-08 DIAGNOSIS — Z79899 Other long term (current) drug therapy: Secondary | ICD-10-CM | POA: Insufficient documentation

## 2015-11-08 DIAGNOSIS — I2510 Atherosclerotic heart disease of native coronary artery without angina pectoris: Secondary | ICD-10-CM | POA: Diagnosis not present

## 2015-11-08 DIAGNOSIS — D709 Neutropenia, unspecified: Secondary | ICD-10-CM

## 2015-11-08 DIAGNOSIS — E119 Type 2 diabetes mellitus without complications: Secondary | ICD-10-CM | POA: Diagnosis not present

## 2015-11-08 DIAGNOSIS — Z7952 Long term (current) use of systemic steroids: Secondary | ICD-10-CM | POA: Insufficient documentation

## 2015-11-08 DIAGNOSIS — I739 Peripheral vascular disease, unspecified: Secondary | ICD-10-CM

## 2015-11-08 DIAGNOSIS — I10 Essential (primary) hypertension: Secondary | ICD-10-CM

## 2015-11-08 DIAGNOSIS — Z7982 Long term (current) use of aspirin: Secondary | ICD-10-CM | POA: Diagnosis not present

## 2015-11-08 DIAGNOSIS — Z7984 Long term (current) use of oral hypoglycemic drugs: Secondary | ICD-10-CM | POA: Diagnosis not present

## 2015-11-08 DIAGNOSIS — Z7689 Persons encountering health services in other specified circumstances: Secondary | ICD-10-CM | POA: Insufficient documentation

## 2015-11-08 DIAGNOSIS — R61 Generalized hyperhidrosis: Secondary | ICD-10-CM | POA: Diagnosis not present

## 2015-11-08 LAB — CBC WITH DIFFERENTIAL/PLATELET
BASOS ABS: 0 10*3/uL (ref 0–0.1)
BASOS PCT: 1 %
EOS ABS: 0 10*3/uL (ref 0–0.7)
EOS PCT: 1 %
HEMATOCRIT: 35.7 % — AB (ref 40.0–52.0)
HEMOGLOBIN: 12.5 g/dL — AB (ref 13.0–18.0)
Lymphocytes Relative: 43 %
Lymphs Abs: 0.9 10*3/uL — ABNORMAL LOW (ref 1.0–3.6)
MCH: 30.4 pg (ref 26.0–34.0)
MCHC: 35 g/dL (ref 32.0–36.0)
MCV: 87 fL (ref 80.0–100.0)
MONO ABS: 0.6 10*3/uL (ref 0.2–1.0)
Monocytes Relative: 29 %
Neutro Abs: 0.5 10*3/uL — ABNORMAL LOW (ref 1.4–6.5)
Neutrophils Relative %: 26 %
Platelets: 194 10*3/uL (ref 150–440)
RBC: 4.1 MIL/uL — ABNORMAL LOW (ref 4.40–5.90)
RDW: 13.8 % (ref 11.5–14.5)
WBC: 2.1 10*3/uL — AB (ref 3.8–10.6)

## 2015-11-08 NOTE — Progress Notes (Signed)
[place]  [phone] [phone]     ID: Brandon Wilkerson OB: 16-Apr-1941  MR#: [id number]  CSN#:[id number]  Patient Care Team: Jodi Marble, MD as PCP - General (Internal Medicine)  CHIEF COMPLAINT/DIAGNOSIS:  Leukopenia/Neutropenia, Mild Anemia - Mild, asymptomatic, of unclear etiology. Workup so far unremarkable as detailed below.  Bone marrow biopsy 05/10/13 - no diagnostic morphologic evidence of B-cell lymphoproliferative disorder or other hematopoietic neoplasia identified.  Normocellular to slightly hypercellular marrow for age 57-50% with maturing trilineage hematopoiesis, storage iron present, slight patchy increase in reticulin  Flow study negative for any B-cell monoclonality, nonspecific myeloid and normocytic findings with no increase in blasts,relatively increased eosinophils 7% is nonspecific,relatively increased normal B-cell precursors (hematogones)and.  Normal male karyotype, 46XY.  Labs done on 11/02/12 - Hb 13.3, platelets 172K, retic 0.036, WBC 3400 with 38% neutrophils, 35% lymphocytes, 10% variant lymphocytes, 12% monocytes. Iron study, B12, folate, ANA, serum immunoelectrophoresis (SIEP), PT, PTT, LDH, haptoglobin, [place]om[place] test, HBsAg, HCV antibody, and HIV antibody all unremarkable.   05/03/13 - Peripheral blood Flow Cytometry. A very small (0.3% of leukocytes) B cell clone is detected. Previous phenotyping results showed similar findings. The clone is very small and is not diagnostic of any specific type of lymphoma. If the patient has a previous history of lymphoma, the finding is consistent with persistent involvement by lymphoma. 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  07/23/15 -  Bone Marrow Biopsy Report  -  nonspecific marrow findings with no diagnostic morphologic or immunophenotypic evidence of hematopoietic neoplasia. Mildly hypercellular marrow for age (40-50%) with myeloid hypoplasia,  increased eosinophils, erythroid hyperplasia with mild nonspecific dyserythropoiesis and mild megakaryocytic atypia. Diffuse mild to focally moderate increase in reticulin. Storage iron present. Flow cytometry reports relatively decreased neutrophilic cells with no immunophenotypic abnormalities or increase in blasts, relatively increased eosinophils (17%) nonspecific. Cytogenetics normal 46XY. SNP microarray result is Normal Male. The treatment with short course of steroids was attempted in November 2016, however there was no response. Treatment with G-CSF was initiated in early December 2016  HISTORY OF PRESENT ILLNESS:  Brandon Wilkerson returns to our clinic for follow-up visit. He has been able to tolerate steroids reasonably well with mild sleep disturbance, but overall he has been able to control his blood sugars quite well. No new symptoms or complaints at this point, no fevers, no worsening of the cuff pain. He's been able to tolerate fluconazole, Levaquin and acyclovir well.  REVIEW OF SYSTEMS:   ROS As in HPI above. In addition, no new headaches or focal weakness.  No sore throat, cough, shortness of breath, sputum, hemoptysis or chest pain. No abdominal pain, constipation, diarrhea, dysuria or hematuria.   PAST MEDICAL HISTORY: Reviewed.         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 As above.  FAMILY HISTORY: Reviewed. Remarkable for diabetes, heart disease, hypertension, breast cancer.   SOCIAL HISTORY: Reviewed. Nonsmoker.  Denies alcohol or recreational drug usage.    Allergies  Allergen Reactions  . Pollen Extract Other (See Comments)    Runny nose    Current Outpatient Prescriptions  Medication Sig Dispense Refill  . acyclovir (ZOVIRAX) 400 MG tablet Take 1 tablet  (400 mg total) by mouth 2 (two) times daily. 60 tablet 5  . amLODipine (NORVASC)  10 MG tablet Take 10 mg by mouth daily.    Marland Kitchen aspirin 81 MG tablet Take 81 mg by mouth daily.    . cloNIDine (CATAPRES) 0.3 MG tablet Take 0.3 mg by mouth 2 (two) times daily.    . clopidogrel (PLAVIX) 75 MG tablet Take 75 mg by mouth daily.    . fluconazole (DIFLUCAN) 200 MG tablet Take 1 tablet (200 mg total) by mouth daily. 30 tablet 2  . fluticasone (FLONASE) 50 MCG/ACT nasal spray Place 1 spray into both nostrils daily.    Marland Kitchen gabapentin (NEURONTIN) 600 MG tablet Take 600 mg by mouth 3 (three) times daily.    Marland Kitchen glipiZIDE (GLUCOTROL) 5 MG tablet Take 2.5 mg by mouth 2 (two) times daily.    . isosorbide mononitrate (IMDUR) 60 MG 24 hr tablet Take 60 mg by mouth daily.    Marland Kitchen levofloxacin (LEVAQUIN) 500 MG tablet Take 1 tablet (500 mg total) by mouth daily. 30 tablet 2  . metFORMIN (GLUCOPHAGE) 1000 MG tablet Take 1,000 mg by mouth 2 (two) times daily with a meal.    . nitroGLYCERIN (NITROSTAT) 0.4 MG SL tablet Place 0.4 mg under the tongue every 5 (five) minutes as needed for chest pain.    . predniSONE (DELTASONE) 50 MG tablet Take 1 daily x 5 days in am and with food 5 tablet 0  . rosuvastatin (CRESTOR) 40 MG tablet   0  . valsartan-hydrochlorothiazide (DIOVAN-HCT) 320-25 MG tablet Take 1 tablet by mouth daily.  0   No current facility-administered medications for this visit.    PHYSICAL EXAM: Filed Vitals:   11/08/15 1059  BP: 158/79  Pulse: 72  Temp: 97.1 F (36.2 C)  Resp: 18     Body mass index is 27.9 kg/(m^2).     BP 158/79 mmHg  Pulse 72  Temp(Src) 97.1 F (36.2 C)  Resp 18  Ht 6' 1"  (1.854 m)  Wt 211 lb 6.7 oz (95.9 kg)  BMI 27.90 kg/m2  General Appearance:    Alert, cooperative, no distress, appears stated age  Head:    Normocephalic, without obvious abnormality, atraumatic  Eyes:    PERRL, conjunctiva/corneas clear, EOM's intact, fundi    benign, both eyes       Ears:    Normal TM's  and external ear canals, both ears  Nose:   Nares normal, septum midline, mucosa normal, no drainage   or sinus tenderness  Throat:   Lips, mucosa, and tongue normal; teeth and gums normal  Neck:   Supple, symmetrical, trachea midline, no adenopathy;       thyroid:  No enlargement/tenderness/nodules; carotid   bruit on L  Back:     Symmetric, no curvature, ROM normal, no CVA tenderness  Lungs:     Clear to auscultation bilaterally, respirations unlabored  Chest wall:    No tenderness or deformity  Heart:    Regular rate and rhythm, S1 and S2 normal, systolic ejection murmur 3 out of 6 on aorta   Abdomen:     Soft, non-tender, bowel sounds active all four quadrants,    no masses, no organomegaly  Extremities:   Extremities normal, atraumatic, no cyanosis or edema  Pulses:   2+ and symmetric all extremities  Skin:   Skin color, texture, turgor normal, no rashes or lesions  Lymph nodes:   Cervical, supraclavicular, and axillary nodes normal  Neurologic:   CNII-XII intact. Normal strength, sensation and reflexes      throughout  LAB RESULTS: Recent Results (from the past 2160 hour(s))  CBC with Differential     Status: Abnormal   Collection Time: 10/29/15  2:52 PM  Result Value Ref Range   WBC 2.1 (L) 3.8 - 10.6 K/uL   RBC 3.90 (L) 4.40 - 5.90 MIL/uL   Hemoglobin 12.0 (L) 13.0 - 18.0 g/dL   HCT 34.1 (L) 40.0 - 52.0 %   MCV 87.5 80.0 - 100.0 fL   MCH 30.7 26.0 - 34.0 pg   MCHC 35.1 32.0 - 36.0 g/dL   RDW 13.6 11.5 - 14.5 %   Platelets 183 150 - 440 K/uL   Neutrophils Relative % 17 %   Neutro Abs 0.4 (L) 1.4 - 6.5 K/uL    Comment: RESULT REPEATED AND VERIFIED CRITICAL RESULT CALLED TO, READ BACK BY AND VERIFIED WITH: ANITA BLACK AT 1504 10/29/2015 KMR    Lymphocytes Relative 52 %   Lymphs Abs 1.1 1.0 - 3.6 K/uL   Monocytes Relative 18 %   Monocytes Absolute 0.4 0.2 - 1.0 K/uL   Eosinophils Relative 11 %   Eosinophils Absolute 0.2 0 - 0.7 K/uL   Basophils Relative 2 %    Basophils Absolute 0.0 0 - 0.1 K/uL  CBC with Differential/Platelet     Status: Abnormal   Collection Time: 11/04/15 10:12 AM  Result Value Ref Range   WBC 2.1 (L) 3.8 - 10.6 K/uL   RBC 4.13 (L) 4.40 - 5.90 MIL/uL   Hemoglobin 12.6 (L) 13.0 - 18.0 g/dL   HCT 36.5 (L) 40.0 - 52.0 %   MCV 88.4 80.0 - 100.0 fL   MCH 30.4 26.0 - 34.0 pg   MCHC 34.4 32.0 - 36.0 g/dL   RDW 13.9 11.5 - 14.5 %   Platelets 181 150 - 440 K/uL   Neutrophils Relative % 17 %   Neutro Abs 0.4 (L) 1.4 - 6.5 K/uL    Comment: RESULT REPEATED AND VERIFIED CRITICAL RESULT CALLED TO, READ BACK BY AND VERIFIED WITH: SHERRY VENABLE AT 1035 11/04/2015 KMR    Lymphocytes Relative 49 %   Lymphs Abs 1.1 1.0 - 3.6 K/uL   Monocytes Relative 19 %   Monocytes Absolute 0.4 0.2 - 1.0 K/uL   Eosinophils Relative 13 %   Eosinophils Absolute 0.3 0 - 0.7 K/uL   Basophils Relative 2 %   Basophils Absolute 0.0 0 - 0.1 K/uL  CBC with Differential/Platelet     Status: Abnormal   Collection Time: 11/08/15 10:09 AM  Result Value Ref Range   WBC 2.1 (L) 3.8 - 10.6 K/uL   RBC 4.10 (L) 4.40 - 5.90 MIL/uL   Hemoglobin 12.5 (L) 13.0 - 18.0 g/dL   HCT 35.7 (L) 40.0 - 52.0 %   MCV 87.0 80.0 - 100.0 fL   MCH 30.4 26.0 - 34.0 pg   MCHC 35.0 32.0 - 36.0 g/dL   RDW 13.8 11.5 - 14.5 %   Platelets 194 150 - 440 K/uL   Neutrophils Relative % 26 %   Neutro Abs 0.5 (L) 1.4 - 6.5 K/uL   Lymphocytes Relative 43 %   Lymphs Abs 0.9 (L) 1.0 - 3.6 K/uL   Monocytes Relative 29 %   Monocytes Absolute 0.6 0.2 - 1.0 K/uL   Eosinophils Relative 1 %   Eosinophils Absolute 0.0 0 - 0.7 K/uL   Basophils Relative 1 %   Basophils Absolute 0.0 0 - 0.1 K/uL     STUDIES: No results found. 07/23/15 -  Bone Marrow  Biopsy Report  -  nonspecific marrow findings with no diagnostic morphologic or immunophenotypic evidence of hematopoietic neoplasia. Mildly hypercellular marrow for age (40-50%) with myeloid hypoplasia, increased eosinophils, erythroid hyperplasia  with mild nonspecific dyserythropoiesis and mild megakaryocytic atypia. Diffuse mild to focally moderate increase in reticulin. Storage iron present. Flow cytometry reports relatively decreased neutrophilic cells with no immunophenotypic abnormalities or increase in blasts, relatively increased eosinophils (17%) nonspecific. Cytogenetics normal 46XY. SNP microarray result is Normal Male.    ASSESSMENT / PLAN:   1. Leukopenia/Neutropenia  -  reason for neutropenia is still unclear. Extensive workup, including 2 bone marrow biopsies did not reveal any evidence of hematologic malignancy, although there were signs of mild atypia in red blood cells and megakaryocytes.  The results of testing for LGL still pending. We attempted to treat Brandon Wilkerson with a short course of prednisone, however 5 days of prednisone did not affect the white blood cell count or absolute neutrophil count. We will not continue treatment with steroids. We will attempt to treat Brandon Wilkerson with Granix (tbo-Neupogen) starting at 5 mcg/kg a day, which amounts to appropriately 480 g per day, with injections administered daily for 3 days. After that the patient will return to our clinic to assess CBC and absolute neutrophil count. Our goal will be to keep Brandon Wilkerson reliably above 1000 cells per microliter, so the patient would be able to avoid prophylactic antibiotics/antifungal/antivirals. The dosage and frequency of injections will have to be adjusted based on actual ANC. He will continue with Levaquin, fluconazole and acyclovir until [place] is above 1000 cells per microliter. My colleague Dr. Nolon Stalls will be providing prescriptions to Brandon Wilkerson.  2. Anemia - mild, no symptoms referable to this. No intervention needed at this time, continue to monitor    3. In between visits, the patient has been advised to call or come to the ER in case of fevers, chills, bleeding, acute sickness or new symptoms and will be evaluated sooner.  Patient is agreeable to this plan.   I spent 25 minutes with Brandon Wilkerson, with more than 50% of the time spent on organizing care and counseling.  Once the approval is received from the insurance, the patient will come to our clinic to be taught on how to inject the medication subcutaneously, or, he will be injected in the clinic, if this is the only way he can receive the treatment. The follow-up appointments will be scheduled once the injections are available.  Roxana Hires, MD   11/08/2015 10:31 AM

## 2015-11-11 LAB — MISC LABCORP TEST (SEND OUT)
LABCORP TEST CODE: 480708
LabCorp test name: 480708

## 2015-11-15 ENCOUNTER — Telehealth: Payer: Self-pay | Admitting: *Deleted

## 2015-11-15 NOTE — Telephone Encounter (Signed)
Contacted pt and let him know that insurance has just approved his granix injection.  I spoke to Dr. Gretel Acreberenzon and he wants him to get injections mon, tues, wed and then see md with labs on Friday.  appts made-12/12 at 1:45, 12/13 at 2:30, 12/14 at 1:45, then labs, see md on Friday 2:15 and md 2:30.  Gave all these dates and times to patient and wife and they are agreeable to these dates and this plan.

## 2015-11-18 ENCOUNTER — Inpatient Hospital Stay: Payer: Medicare HMO

## 2015-11-18 ENCOUNTER — Other Ambulatory Visit: Payer: Self-pay | Admitting: *Deleted

## 2015-11-18 VITALS — BP 158/80 | HR 66 | Temp 95.0°F | Resp 20

## 2015-11-18 DIAGNOSIS — D709 Neutropenia, unspecified: Secondary | ICD-10-CM | POA: Diagnosis not present

## 2015-11-18 LAB — CBC WITH DIFFERENTIAL/PLATELET
BASOS PCT: 1 %
Basophils Absolute: 0 10*3/uL (ref 0–0.1)
EOS ABS: 0.2 10*3/uL (ref 0–0.7)
Eosinophils Relative: 10 %
HCT: 33.6 % — ABNORMAL LOW (ref 40.0–52.0)
HEMOGLOBIN: 11.5 g/dL — AB (ref 13.0–18.0)
LYMPHS ABS: 0.9 10*3/uL — AB (ref 1.0–3.6)
Lymphocytes Relative: 44 %
MCH: 30.1 pg (ref 26.0–34.0)
MCHC: 34.3 g/dL (ref 32.0–36.0)
MCV: 87.9 fL (ref 80.0–100.0)
MONO ABS: 0.3 10*3/uL (ref 0.2–1.0)
MONOS PCT: 16 %
NEUTROS PCT: 29 %
Neutro Abs: 0.6 10*3/uL — ABNORMAL LOW (ref 1.4–6.5)
Platelets: 156 10*3/uL (ref 150–440)
RBC: 3.82 MIL/uL — ABNORMAL LOW (ref 4.40–5.90)
RDW: 13.6 % (ref 11.5–14.5)
WBC: 2.1 10*3/uL — ABNORMAL LOW (ref 3.8–10.6)

## 2015-11-18 MED ORDER — TBO-FILGRASTIM 480 MCG/0.8ML ~~LOC~~ SOSY
480.0000 ug | PREFILLED_SYRINGE | Freq: Once | SUBCUTANEOUS | Status: AC
  Administered 2015-11-18: 480 ug via SUBCUTANEOUS
  Filled 2015-11-18: qty 0.8

## 2015-11-19 ENCOUNTER — Inpatient Hospital Stay: Payer: Medicare HMO

## 2015-11-19 DIAGNOSIS — D709 Neutropenia, unspecified: Secondary | ICD-10-CM | POA: Diagnosis not present

## 2015-11-19 MED ORDER — TBO-FILGRASTIM 480 MCG/0.8ML ~~LOC~~ SOSY
480.0000 ug | PREFILLED_SYRINGE | Freq: Once | SUBCUTANEOUS | Status: AC
  Administered 2015-11-19: 480 ug via SUBCUTANEOUS
  Filled 2015-11-19: qty 0.8

## 2015-11-20 ENCOUNTER — Inpatient Hospital Stay: Payer: Medicare HMO

## 2015-11-20 VITALS — BP 147/64 | HR 80 | Temp 97.0°F | Resp 20

## 2015-11-20 DIAGNOSIS — D709 Neutropenia, unspecified: Secondary | ICD-10-CM

## 2015-11-20 MED ORDER — TBO-FILGRASTIM 480 MCG/0.8ML ~~LOC~~ SOSY
480.0000 ug | PREFILLED_SYRINGE | Freq: Once | SUBCUTANEOUS | Status: AC
  Administered 2015-11-20: 480 ug via SUBCUTANEOUS
  Filled 2015-11-20: qty 0.8

## 2015-11-21 ENCOUNTER — Other Ambulatory Visit: Payer: Self-pay | Admitting: *Deleted

## 2015-11-21 DIAGNOSIS — D709 Neutropenia, unspecified: Secondary | ICD-10-CM

## 2015-11-22 ENCOUNTER — Inpatient Hospital Stay (HOSPITAL_BASED_OUTPATIENT_CLINIC_OR_DEPARTMENT_OTHER): Payer: Medicare HMO | Admitting: Internal Medicine

## 2015-11-22 ENCOUNTER — Encounter: Payer: Self-pay | Admitting: Internal Medicine

## 2015-11-22 ENCOUNTER — Inpatient Hospital Stay: Payer: Medicare HMO

## 2015-11-22 VITALS — BP 130/70 | HR 73 | Temp 96.7°F | Wt 211.4 lb

## 2015-11-22 DIAGNOSIS — D649 Anemia, unspecified: Secondary | ICD-10-CM | POA: Diagnosis not present

## 2015-11-22 DIAGNOSIS — D709 Neutropenia, unspecified: Secondary | ICD-10-CM | POA: Diagnosis not present

## 2015-11-22 DIAGNOSIS — I10 Essential (primary) hypertension: Secondary | ICD-10-CM

## 2015-11-22 DIAGNOSIS — D696 Thrombocytopenia, unspecified: Secondary | ICD-10-CM | POA: Diagnosis not present

## 2015-11-22 DIAGNOSIS — R61 Generalized hyperhidrosis: Secondary | ICD-10-CM | POA: Diagnosis not present

## 2015-11-22 DIAGNOSIS — Z79899 Other long term (current) drug therapy: Secondary | ICD-10-CM

## 2015-11-22 DIAGNOSIS — I739 Peripheral vascular disease, unspecified: Secondary | ICD-10-CM

## 2015-11-22 DIAGNOSIS — Z7952 Long term (current) use of systemic steroids: Secondary | ICD-10-CM

## 2015-11-22 DIAGNOSIS — E119 Type 2 diabetes mellitus without complications: Secondary | ICD-10-CM

## 2015-11-22 DIAGNOSIS — I2510 Atherosclerotic heart disease of native coronary artery without angina pectoris: Secondary | ICD-10-CM

## 2015-11-22 DIAGNOSIS — Z7984 Long term (current) use of oral hypoglycemic drugs: Secondary | ICD-10-CM

## 2015-11-22 DIAGNOSIS — Z7982 Long term (current) use of aspirin: Secondary | ICD-10-CM

## 2015-11-22 LAB — CBC WITH DIFFERENTIAL/PLATELET
BASOS PCT: 1 %
Basophils Absolute: 0 10*3/uL (ref 0–0.1)
EOS ABS: 0.1 10*3/uL (ref 0–0.7)
EOS PCT: 4 %
HCT: 31.7 % — ABNORMAL LOW (ref 40.0–52.0)
Hemoglobin: 10.6 g/dL — ABNORMAL LOW (ref 13.0–18.0)
LYMPHS ABS: 0.6 10*3/uL — AB (ref 1.0–3.6)
Lymphocytes Relative: 23 %
MCH: 29.7 pg (ref 26.0–34.0)
MCHC: 33.5 g/dL (ref 32.0–36.0)
MCV: 88.7 fL (ref 80.0–100.0)
Monocytes Absolute: 0.7 10*3/uL (ref 0.2–1.0)
Monocytes Relative: 24 %
Neutro Abs: 1.4 10*3/uL (ref 1.4–6.5)
Neutrophils Relative %: 48 %
PLATELETS: 136 10*3/uL — AB (ref 150–440)
RBC: 3.58 MIL/uL — AB (ref 4.40–5.90)
RDW: 13.6 % (ref 11.5–14.5)
WBC: 2.9 10*3/uL — AB (ref 3.8–10.6)

## 2015-11-22 NOTE — Progress Notes (Signed)
[place]  [phone] [phone]     ID: Brandon Wilkerson OB: 04-10-1941  MR#: [id number]  CSN#:[id number]  Patient Care Team: Jodi Marble, MD as PCP - General (Internal Medicine)  CHIEF COMPLAINT/DIAGNOSIS:  Leukopenia/Neutropenia, Mild Anemia - Mild, asymptomatic, of unclear etiology. Workup so far unremarkable as detailed below.  Bone marrow biopsy 05/10/13 - no diagnostic morphologic evidence of B-cell lymphoproliferative disorder or other hematopoietic neoplasia identified.  Normocellular to slightly hypercellular marrow for age 61-50% with maturing trilineage hematopoiesis, storage iron present, slight patchy increase in reticulin  Flow study negative for any B-cell monoclonality, nonspecific myeloid and normocytic findings with no increase in blasts,relatively increased eosinophils 7% is nonspecific,relatively increased normal B-cell precursors (hematogones)and.  Normal male karyotype, 46XY.  Labs done on 11/02/12 - Hb 13.3, platelets 172K, retic 0.036, WBC 3400 with 38% neutrophils, 35% lymphocytes, 10% variant lymphocytes, 12% monocytes. Iron study, B12, folate, ANA, serum immunoelectrophoresis (SIEP), PT, PTT, LDH, haptoglobin, Coombs test, HBsAg, HCV antibody, and HIV antibody all unremarkable.   05/03/13 - Peripheral blood Flow Cytometry. A very small (0.3% of leukocytes) B cell clone is detected. Previous phenotyping results showed similar findings. The clone is very small and is not diagnostic of any specific type of lymphoma. If the patient has a previous history of lymphoma, the finding is consistent with persistent involvement by lymphoma. 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  07/23/15 -  Bone Marrow Biopsy Report  -  nonspecific marrow findings with no diagnostic morphologic or immunophenotypic evidence of hematopoietic neoplasia. Mildly hypercellular marrow for age (40-50%) with myeloid hypoplasia,  increased eosinophils, erythroid hyperplasia with mild nonspecific dyserythropoiesis and mild megakaryocytic atypia. Diffuse mild to focally moderate increase in reticulin. Storage iron present. Flow cytometry reports relatively decreased neutrophilic cells with no immunophenotypic abnormalities or increase in blasts, relatively increased eosinophils (17%) nonspecific. Cytogenetics normal 46XY. SNP microarray result is Normal Male. The treatment with short course of steroids was attempted in November 2016, however there was no response. Treatment with G-CSF was initiated in early December 2016  HISTORY OF PRESENT ILLNESS:  Brandon Wilkerson returns to our clinic for follow-up visit. He has been able to tolerate injections of Granex well without any bone pain. He complains of 2 episodes of profuse sweating all the past 2 days. He claims that his wife has been taking antibiotics for upper respiratory infection. He has been on Levaquin, Diflucan and acyclovir. He denies any other complaints.  REVIEW OF SYSTEMS:   ROS As in HPI above. In addition, no new headaches or focal weakness.  No sore throat, cough, shortness of breath, sputum, hemoptysis or chest pain. No abdominal pain, constipation, diarrhea, dysuria or hematuria.   PAST MEDICAL HISTORY: Reviewed.         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 As above.  FAMILY HISTORY: Reviewed. Remarkable for diabetes, heart disease, hypertension, breast cancer.   SOCIAL HISTORY: Reviewed. Nonsmoker.  Denies alcohol or recreational drug usage.    Allergies  Allergen Reactions  . Pollen Extract Other (See Comments)    Runny nose    Current Outpatient Prescriptions  Medication Sig Dispense Refill  . acyclovir (ZOVIRAX) 400 MG tablet Take 1  tablet (400 mg total) by mouth 2 (two) times daily. 60 tablet 5  . amLODipine (  NORVASC) 10 MG tablet Take 10 mg by mouth daily.    Marland Kitchen aspirin 81 MG tablet Take 81 mg by mouth daily.    . cloNIDine (CATAPRES) 0.3 MG tablet Take 0.3 mg by mouth 2 (two) times daily.    . clopidogrel (PLAVIX) 75 MG tablet Take 75 mg by mouth daily.    . fluconazole (DIFLUCAN) 200 MG tablet Take 1 tablet (200 mg total) by mouth daily. 30 tablet 2  . fluticasone (FLONASE) 50 MCG/ACT nasal spray Place 1 spray into both nostrils daily.    Marland Kitchen gabapentin (NEURONTIN) 600 MG tablet Take 600 mg by mouth 3 (three) times daily.    Marland Kitchen glipiZIDE (GLUCOTROL) 5 MG tablet Take 2.5 mg by mouth 2 (two) times daily.    . isosorbide mononitrate (IMDUR) 60 MG 24 hr tablet Take 60 mg by mouth daily.    Marland Kitchen levofloxacin (LEVAQUIN) 500 MG tablet Take 1 tablet (500 mg total) by mouth daily. 30 tablet 2  . metFORMIN (GLUCOPHAGE) 1000 MG tablet Take 1,000 mg by mouth 2 (two) times daily with a meal.    . nitroGLYCERIN (NITROSTAT) 0.4 MG SL tablet Place 0.4 mg under the tongue every 5 (five) minutes as needed for chest pain.    . predniSONE (DELTASONE) 50 MG tablet Take 1 daily x 5 days in am and with food 5 tablet 0  . rosuvastatin (CRESTOR) 40 MG tablet   0  . valsartan-hydrochlorothiazide (DIOVAN-HCT) 320-25 MG tablet Take 1 tablet by mouth daily.  0   No current facility-administered medications for this visit.    PHYSICAL EXAM: Filed Vitals:   11/22/15 1433  BP: 130/70  Pulse: 73  Temp: 96.7 F (35.9 C)     Body mass index is 27.9 kg/(m^2).     BP 130/70 mmHg  Pulse 73  Temp(Src) 96.7 F (35.9 C) (Tympanic)  Wt 211 lb 6.7 oz (95.9 kg)  General Appearance:    Alert, cooperative, no distress, appears stated age  Head:    Normocephalic, without obvious abnormality, atraumatic  Eyes:    PERRL, conjunctiva/corneas clear, EOM's intact, fundi    benign, both eyes       Ears:    Normal TM's and external ear canals, both ears  Nose:    Nares normal, septum midline, mucosa normal, no drainage   or sinus tenderness  Throat:   Lips, mucosa, and tongue normal; teeth and gums normal  Neck:   Supple, symmetrical, trachea midline, no adenopathy;       thyroid:  No enlargement/tenderness/nodules; carotid   bruit on L  Back:     Symmetric, no curvature, ROM normal, no CVA tenderness  Lungs:     Clear to auscultation bilaterally, respirations unlabored  Chest wall:    No tenderness or deformity  Heart:    Regular rate and rhythm, S1 and S2 normal, systolic ejection murmur 3 out of 6 on aorta   Abdomen:     Soft, non-tender, bowel sounds active all four quadrants,    no masses, no organomegaly  Extremities:   Extremities normal, atraumatic, no cyanosis or edema  Pulses:   2+ and symmetric all extremities  Skin:   Skin color, texture, turgor normal, no rashes or lesions  Lymph nodes:   Cervical, supraclavicular, and axillary nodes normal  Neurologic:   CNII-XII intact. Normal strength, sensation and reflexes      throughout     LAB RESULTS: Recent Results (from the past 2160 hour(s))  CBC with Differential  Status: Abnormal   Collection Time: 10/29/15  2:52 PM  Result Value Ref Range   WBC 2.1 (L) 3.8 - 10.6 K/uL   RBC 3.90 (L) 4.40 - 5.90 MIL/uL   Hemoglobin 12.0 (L) 13.0 - 18.0 g/dL   HCT 34.1 (L) 40.0 - 52.0 %   MCV 87.5 80.0 - 100.0 fL   MCH 30.7 26.0 - 34.0 pg   MCHC 35.1 32.0 - 36.0 g/dL   RDW 13.6 11.5 - 14.5 %   Platelets 183 150 - 440 K/uL   Neutrophils Relative % 17 %   Neutro Abs 0.4 (L) 1.4 - 6.5 K/uL    Comment: RESULT REPEATED AND VERIFIED CRITICAL RESULT CALLED TO, READ BACK BY AND VERIFIED WITH: ANITA BLACK AT 1504 10/29/2015 KMR    Lymphocytes Relative 52 %   Lymphs Abs 1.1 1.0 - 3.6 K/uL   Monocytes Relative 18 %   Monocytes Absolute 0.4 0.2 - 1.0 K/uL   Eosinophils Relative 11 %   Eosinophils Absolute 0.2 0 - 0.7 K/uL   Basophils Relative 2 %   Basophils Absolute 0.0 0 - 0.1 K/uL  CBC  with Differential/Platelet     Status: Abnormal   Collection Time: 11/04/15 10:12 AM  Result Value Ref Range   WBC 2.1 (L) 3.8 - 10.6 K/uL   RBC 4.13 (L) 4.40 - 5.90 MIL/uL   Hemoglobin 12.6 (L) 13.0 - 18.0 g/dL   HCT 36.5 (L) 40.0 - 52.0 %   MCV 88.4 80.0 - 100.0 fL   MCH 30.4 26.0 - 34.0 pg   MCHC 34.4 32.0 - 36.0 g/dL   RDW 13.9 11.5 - 14.5 %   Platelets 181 150 - 440 K/uL   Neutrophils Relative % 17 %   Neutro Abs 0.4 (L) 1.4 - 6.5 K/uL    Comment: RESULT REPEATED AND VERIFIED CRITICAL RESULT CALLED TO, READ BACK BY AND VERIFIED WITH: SHERRY VENABLE AT 1035 11/04/2015 KMR    Lymphocytes Relative 49 %   Lymphs Abs 1.1 1.0 - 3.6 K/uL   Monocytes Relative 19 %   Monocytes Absolute 0.4 0.2 - 1.0 K/uL   Eosinophils Relative 13 %   Eosinophils Absolute 0.3 0 - 0.7 K/uL   Basophils Relative 2 %   Basophils Absolute 0.0 0 - 0.1 K/uL  Miscellaneous [facility] test (send-out)     Status: None   Collection Time: 11/04/15 10:12 AM  Result Value Ref Range   [facility] test code [id number]    [facility] test name [id number]    Misc [facility] result COMMENT     Comment: (NOTE) Test Ordered: [id number] T-Cell Gene Rearrangement, PCR T-Cell PCR Interpretation      Comment                   YU   NEGATIVE: No clonal T-cell receptor gamma (TCRG) population was          detected Interpretation of this result should be made in the context of other clinical, morphologic, and immunophenotypic findings. This PCR assay detects approximately 89% of T-cell clonal populations but as with all amplification based assays, may not detect all possible T- cell receptor gene rearrangements. If significant suspicion of clonality remains, T-cell receptor beta (TCRB, test code [phone]) test is recommended. The combined T-cell clonality detection rate of TCRG and TCRB is 94-99%. The T-cell receptor gene rearrangement panel (includung both TCRG and TCRB, test code [phone]) is available for parallel testing. Director Review:  Comment                   YU   Constance Goltz, PhD, [facility]               Director, [place] [facility] [place], [place]               [phone]    Methodology:               Comment                   YU   Patient cellular DNA is subjected to polymerase chain reaction using 2 optimized primer sets that target multiple V and J exon regions within the T cell receptor gamma chain gene on chromosome 7. Rearrangements are detected by identification of specific clonal amplicon products following electrophoresis.  This PCR assay is capable of detecting a clonal population at a sensitivity of 5 clonal cells per 100 normal cells.     References:               Comment                   YU   Arber A (2000) [employer] [phone]. Terrial Rhodes. ([place]) [place] [phone]. Miller JE et al. (1999) Molecular Diagnosis 4:101-117. This test was developed and its performance characteristics determined by [facility].  It has not been cleared or approved by the Food and Drug Administration.   The FDA has determined that such clearance or approval is not necessary. Performed At: [facility] [place], [place] [id number] Lindon Romp MD [id number] Performed At: [facility] [address] [place], [place] [id number] Nechama Guard MD WN:[id number]   CBC with Differential/Platelet     Status: Abnormal   Collection Time: 11/08/15 10:09 AM  Result Value Ref Range   WBC 2.1 (L) 3.8 - 10.6 K/uL   RBC 4.10 (L) 4.40 - 5.90 MIL/uL   Hemoglobin 12.5 (L) 13.0 - 18.0 g/dL   HCT 35.7 (L) 40.0 - 52.0 %   MCV 87.0 80.0 - 100.0 fL   MCH 30.4 26.0 - 34.0 pg   MCHC 35.0 32.0 - 36.0 g/dL   RDW 13.8 11.5 - 14.5 %   Platelets 194 150 - 440 K/uL   Neutrophils Relative % 26 %   Neutro Abs 0.5 (L) 1.4 - 6.5 K/uL   Lymphocytes Relative 43 %     Lymphs Abs 0.9 (L) 1.0 - 3.6 K/uL   Monocytes Relative 29 %   Monocytes Absolute 0.6 0.2 - 1.0 K/uL   Eosinophils Relative 1 %   Eosinophils Absolute 0.0 0 - 0.7 K/uL   Basophils Relative 1 %   Basophils Absolute 0.0 0 - 0.1 K/uL  CBC with Differential/Platelet     Status: Abnormal   Collection Time: 11/18/15  1:50 PM  Result Value Ref Range   WBC 2.1 (L) 3.8 - 10.6 K/uL   RBC 3.82 (L) 4.40 - 5.90 MIL/uL   Hemoglobin 11.5 (L) 13.0 - 18.0 g/dL   HCT 33.6 (L) 40.0 - 52.0 %   MCV 87.9 80.0 -  100.0 fL   MCH 30.1 26.0 - 34.0 pg   MCHC 34.3 32.0 - 36.0 g/dL   RDW 13.6 11.5 - 14.5 %   Platelets 156 150 - 440 K/uL   Neutrophils Relative % 29 %   Neutro Abs 0.6 (L) 1.4 - 6.5 K/uL   Lymphocytes Relative 44 %   Lymphs Abs 0.9 (L) 1.0 - 3.6 K/uL   Monocytes Relative 16 %   Monocytes Absolute 0.3 0.2 - 1.0 K/uL   Eosinophils Relative 10 %   Eosinophils Absolute 0.2 0 - 0.7 K/uL   Basophils Relative 1 %   Basophils Absolute 0.0 0 - 0.1 K/uL  CBC with Differential/Platelet     Status: Abnormal   Collection Time: 11/22/15  2:07 PM  Result Value Ref Range   WBC 2.9 (L) 3.8 - 10.6 K/uL   RBC 3.58 (L) 4.40 - 5.90 MIL/uL   Hemoglobin 10.6 (L) 13.0 - 18.0 g/dL   HCT 31.7 (L) 40.0 - 52.0 %   MCV 88.7 80.0 - 100.0 fL   MCH 29.7 26.0 - 34.0 pg   MCHC 33.5 32.0 - 36.0 g/dL   RDW 13.6 11.5 - 14.5 %   Platelets 136 (L) 150 - 440 K/uL   Neutrophils Relative % 48 %   Neutro Abs 1.4 1.4 - 6.5 K/uL   Lymphocytes Relative 23 %   Lymphs Abs 0.6 (L) 1.0 - 3.6 K/uL   Monocytes Relative 24 %   Monocytes Absolute 0.7 0.2 - 1.0 K/uL   Eosinophils Relative 4 %   Eosinophils Absolute 0.1 0 - 0.7 K/uL   Basophils Relative 1 %   Basophils Absolute 0.0 0 - 0.1 K/uL     STUDIES: No results found. 07/23/15 -  Bone Marrow Biopsy Report  -  nonspecific marrow findings with no diagnostic morphologic or immunophenotypic evidence of hematopoietic neoplasia. Mildly hypercellular marrow for age (40-50%) with  myeloid hypoplasia, increased eosinophils, erythroid hyperplasia with mild nonspecific dyserythropoiesis and mild megakaryocytic atypia. Diffuse mild to focally moderate increase in reticulin. Storage iron present. Flow cytometry reports relatively decreased neutrophilic cells with no immunophenotypic abnormalities or increase in blasts, relatively increased eosinophils (17%) nonspecific. Cytogenetics normal 46XY. SNP microarray result is Normal Male.    ASSESSMENT / PLAN:   1. Leukopenia/Neutropenia  of unclear origin-as previously stated, extensive workup including multiple bone marrow biopsies failed to reveal clear etiology of persistent neutropenia. Treatment with Neupogen biosimilar was initiated. This week. After 3 consecutive days of injecting 480 g of Graham asked absolute neutrophil count is 1400/mcL. Our goal is to keep absolute neutrophil count above thousand cells per microliter. We will recheck his CBC on Monday, and at that point we will decide on the frequency of injections. He can stop his prophylactic Levaquin, Diflucan and acyclovir.  2. Anemia, thrombocytopenia, night sweats - at this time it is unclear whether the recent changes in blood parameters and 2 episodes of sweating are related. He claims that his wife has had upper respiratory infections over the past 2 weeks, which required oral antibiotics. We will monitor his blood counts and clinical parameters closely area he will return to our clinic in 3 days on Monday to check CBC and continue Neupogen injections.  3. In between visits, the patient has been advised to call or come to the ER in case of fevers, chills, bleeding, acute sickness or new symptoms and will be evaluated sooner. Patient is agreeable to this plan.   I spent 25 minutes with Mr.  Wilkerson, with more than 50% of the time spent on organizing care and counseling.   Roxana Hires, MD   11/22/2015 2:15 PM

## 2015-11-25 ENCOUNTER — Inpatient Hospital Stay: Payer: Medicare HMO

## 2015-11-25 DIAGNOSIS — D709 Neutropenia, unspecified: Secondary | ICD-10-CM | POA: Diagnosis not present

## 2015-11-25 LAB — CBC WITH DIFFERENTIAL/PLATELET
BASOS ABS: 0 10*3/uL (ref 0–0.1)
Basophils Relative: 1 %
Eosinophils Absolute: 0.2 10*3/uL (ref 0–0.7)
Eosinophils Relative: 5 %
HEMATOCRIT: 32.3 % — AB (ref 40.0–52.0)
Hemoglobin: 11.4 g/dL — ABNORMAL LOW (ref 13.0–18.0)
LYMPHS ABS: 0.8 10*3/uL — AB (ref 1.0–3.6)
LYMPHS PCT: 22 %
MCH: 30.7 pg (ref 26.0–34.0)
MCHC: 35.2 g/dL (ref 32.0–36.0)
MCV: 87.2 fL (ref 80.0–100.0)
MONO ABS: 0.5 10*3/uL (ref 0.2–1.0)
MONOS PCT: 12 %
NEUTROS ABS: 2.4 10*3/uL (ref 1.4–6.5)
Neutrophils Relative %: 60 %
Platelets: 140 10*3/uL — ABNORMAL LOW (ref 150–440)
RBC: 3.71 MIL/uL — ABNORMAL LOW (ref 4.40–5.90)
RDW: 13.6 % (ref 11.5–14.5)
WBC: 3.9 10*3/uL (ref 3.8–10.6)

## 2015-11-25 NOTE — Progress Notes (Unsigned)
Hold granix today. Recheck cbc on Wednesday and schedule for possible granix.

## 2015-11-27 ENCOUNTER — Ambulatory Visit: Payer: Medicare HMO

## 2015-11-27 ENCOUNTER — Inpatient Hospital Stay: Payer: Medicare HMO

## 2015-11-27 ENCOUNTER — Other Ambulatory Visit: Payer: Medicare HMO

## 2015-11-27 DIAGNOSIS — D709 Neutropenia, unspecified: Secondary | ICD-10-CM | POA: Diagnosis not present

## 2015-11-27 LAB — CBC WITH DIFFERENTIAL/PLATELET
BASOS PCT: 1 %
Basophils Absolute: 0 10*3/uL (ref 0–0.1)
EOS ABS: 0.2 10*3/uL (ref 0–0.7)
EOS PCT: 6 %
HCT: 32.7 % — ABNORMAL LOW (ref 40.0–52.0)
Hemoglobin: 11.3 g/dL — ABNORMAL LOW (ref 13.0–18.0)
Lymphocytes Relative: 24 %
Lymphs Abs: 1 10*3/uL (ref 1.0–3.6)
MCH: 30.2 pg (ref 26.0–34.0)
MCHC: 34.6 g/dL (ref 32.0–36.0)
MCV: 87.4 fL (ref 80.0–100.0)
MONO ABS: 0.3 10*3/uL (ref 0.2–1.0)
MONOS PCT: 7 %
NEUTROS PCT: 62 %
Neutro Abs: 2.6 10*3/uL (ref 1.4–6.5)
PLATELETS: 148 10*3/uL — AB (ref 150–440)
RBC: 3.75 MIL/uL — ABNORMAL LOW (ref 4.40–5.90)
RDW: 13.5 % (ref 11.5–14.5)
WBC: 4.1 10*3/uL (ref 3.8–10.6)

## 2015-11-29 ENCOUNTER — Other Ambulatory Visit: Payer: Self-pay | Admitting: *Deleted

## 2015-11-29 DIAGNOSIS — D709 Neutropenia, unspecified: Secondary | ICD-10-CM

## 2015-12-04 ENCOUNTER — Inpatient Hospital Stay: Payer: Medicare HMO

## 2015-12-04 DIAGNOSIS — D709 Neutropenia, unspecified: Secondary | ICD-10-CM | POA: Diagnosis not present

## 2015-12-04 LAB — CBC WITH DIFFERENTIAL/PLATELET
Basophils Absolute: 0.1 10*3/uL (ref 0–0.1)
Basophils Relative: 2 %
EOS ABS: 0.2 10*3/uL (ref 0–0.7)
EOS PCT: 5 %
HCT: 33.6 % — ABNORMAL LOW (ref 40.0–52.0)
HEMOGLOBIN: 11.6 g/dL — AB (ref 13.0–18.0)
LYMPHS ABS: 1 10*3/uL (ref 1.0–3.6)
LYMPHS PCT: 25 %
MCH: 30.5 pg (ref 26.0–34.0)
MCHC: 34.6 g/dL (ref 32.0–36.0)
MCV: 88 fL (ref 80.0–100.0)
MONOS PCT: 8 %
Monocytes Absolute: 0.3 10*3/uL (ref 0.2–1.0)
Neutro Abs: 2.5 10*3/uL (ref 1.4–6.5)
Neutrophils Relative %: 60 %
PLATELETS: 223 10*3/uL (ref 150–440)
RBC: 3.81 MIL/uL — AB (ref 4.40–5.90)
RDW: 13.9 % (ref 11.5–14.5)
WBC: 4 10*3/uL (ref 3.8–10.6)

## 2015-12-05 ENCOUNTER — Telehealth: Payer: Self-pay | Admitting: *Deleted

## 2015-12-05 NOTE — Telephone Encounter (Signed)
Pt . Contacted to let him know that pt labs the wbc and neutrophils are normal. The plt came back to normal.  Since his labs are good he can come back in 2 weeks and he wants early lab appt.  I spoke to Vernona RiegerLaura and there is an opening for 8:30.  So pt will come 1/11 at 8;30 and he is agreeable to the plan.

## 2015-12-10 ENCOUNTER — Inpatient Hospital Stay: Payer: Medicare HMO

## 2015-12-18 ENCOUNTER — Telehealth: Payer: Self-pay | Admitting: *Deleted

## 2015-12-18 ENCOUNTER — Inpatient Hospital Stay: Payer: Medicare HMO | Attending: Internal Medicine

## 2015-12-18 DIAGNOSIS — D696 Thrombocytopenia, unspecified: Secondary | ICD-10-CM | POA: Insufficient documentation

## 2015-12-18 DIAGNOSIS — Z7689 Persons encountering health services in other specified circumstances: Secondary | ICD-10-CM | POA: Diagnosis not present

## 2015-12-18 DIAGNOSIS — D649 Anemia, unspecified: Secondary | ICD-10-CM | POA: Diagnosis not present

## 2015-12-18 DIAGNOSIS — D709 Neutropenia, unspecified: Secondary | ICD-10-CM | POA: Diagnosis not present

## 2015-12-18 LAB — CBC WITH DIFFERENTIAL/PLATELET
Basophils Absolute: 0 10*3/uL (ref 0–0.1)
Basophils Relative: 2 %
EOS ABS: 0.3 10*3/uL (ref 0–0.7)
EOS PCT: 15 %
HCT: 33.8 % — ABNORMAL LOW (ref 40.0–52.0)
HEMOGLOBIN: 11.6 g/dL — AB (ref 13.0–18.0)
LYMPHS ABS: 1 10*3/uL (ref 1.0–3.6)
Lymphocytes Relative: 45 %
MCH: 30.4 pg (ref 26.0–34.0)
MCHC: 34.4 g/dL (ref 32.0–36.0)
MCV: 88.4 fL (ref 80.0–100.0)
MONOS PCT: 20 %
Monocytes Absolute: 0.4 10*3/uL (ref 0.2–1.0)
NEUTROS PCT: 18 %
Neutro Abs: 0.4 10*3/uL — ABNORMAL LOW (ref 1.4–6.5)
Platelets: 165 10*3/uL (ref 150–440)
RBC: 3.83 MIL/uL — ABNORMAL LOW (ref 4.40–5.90)
RDW: 14.5 % (ref 11.5–14.5)
WBC: 2.2 10*3/uL — ABNORMAL LOW (ref 3.8–10.6)

## 2015-12-18 NOTE — Telephone Encounter (Signed)
rcvd call from kim [id number] ANC.4.  Notified MD  At 9:08 and Dr. Gretel AcreBerenzon says to get pt scheduled for granix shots again.  Will make arrangements and call pt.

## 2015-12-18 NOTE — Telephone Encounter (Signed)
Called and spoke to wife and Dr. Gretel AcreBerenzon said just 2 injections of granix and then 2 weeks from today for lab only and the appt is 8:45 for injections tom. And Friday and then 9 am 1/25 for lab only.  She wrote everything down and I left copy of appts at front desk from them to pick up tom.

## 2015-12-19 ENCOUNTER — Inpatient Hospital Stay: Payer: Medicare HMO

## 2015-12-19 ENCOUNTER — Ambulatory Visit: Payer: Medicare HMO

## 2015-12-19 VITALS — BP 167/56 | HR 72 | Temp 97.0°F | Resp 20

## 2015-12-19 DIAGNOSIS — D709 Neutropenia, unspecified: Secondary | ICD-10-CM | POA: Diagnosis not present

## 2015-12-19 MED ORDER — TBO-FILGRASTIM 480 MCG/0.8ML ~~LOC~~ SOSY
480.0000 ug | PREFILLED_SYRINGE | Freq: Once | SUBCUTANEOUS | Status: AC
  Administered 2015-12-19: 480 ug via SUBCUTANEOUS
  Filled 2015-12-19: qty 0.8

## 2015-12-20 ENCOUNTER — Inpatient Hospital Stay: Payer: Medicare HMO

## 2015-12-20 DIAGNOSIS — D709 Neutropenia, unspecified: Secondary | ICD-10-CM | POA: Diagnosis not present

## 2015-12-20 MED ORDER — TBO-FILGRASTIM 480 MCG/0.8ML ~~LOC~~ SOSY
480.0000 ug | PREFILLED_SYRINGE | Freq: Once | SUBCUTANEOUS | Status: AC
  Administered 2015-12-20: 480 ug via SUBCUTANEOUS
  Filled 2015-12-20: qty 0.8

## 2016-01-01 ENCOUNTER — Inpatient Hospital Stay: Payer: Medicare HMO

## 2016-01-01 DIAGNOSIS — D709 Neutropenia, unspecified: Secondary | ICD-10-CM

## 2016-01-01 LAB — CBC WITH DIFFERENTIAL/PLATELET
BASOS PCT: 1 %
Basophils Absolute: 0.1 10*3/uL (ref 0–0.1)
EOS ABS: 0.4 10*3/uL (ref 0–0.7)
EOS PCT: 9 %
HCT: 33.5 % — ABNORMAL LOW (ref 40.0–52.0)
HEMOGLOBIN: 11.8 g/dL — AB (ref 13.0–18.0)
Lymphocytes Relative: 24 %
Lymphs Abs: 1.1 10*3/uL (ref 1.0–3.6)
MCH: 31.2 pg (ref 26.0–34.0)
MCHC: 35.3 g/dL (ref 32.0–36.0)
MCV: 88.4 fL (ref 80.0–100.0)
Monocytes Absolute: 0.2 10*3/uL (ref 0.2–1.0)
Monocytes Relative: 5 %
NEUTROS PCT: 61 %
Neutro Abs: 2.7 10*3/uL (ref 1.4–6.5)
PLATELETS: 183 10*3/uL (ref 150–440)
RBC: 3.79 MIL/uL — AB (ref 4.40–5.90)
RDW: 14.2 % (ref 11.5–14.5)
WBC: 4.5 10*3/uL (ref 3.8–10.6)

## 2016-01-21 ENCOUNTER — Ambulatory Visit: Payer: Medicare HMO | Admitting: Internal Medicine

## 2016-01-21 ENCOUNTER — Other Ambulatory Visit: Payer: Self-pay | Admitting: *Deleted

## 2016-01-21 ENCOUNTER — Encounter: Payer: Self-pay | Admitting: Internal Medicine

## 2016-01-21 ENCOUNTER — Inpatient Hospital Stay (HOSPITAL_BASED_OUTPATIENT_CLINIC_OR_DEPARTMENT_OTHER): Payer: Medicare HMO | Admitting: Internal Medicine

## 2016-01-21 ENCOUNTER — Inpatient Hospital Stay: Payer: Medicare HMO

## 2016-01-21 ENCOUNTER — Inpatient Hospital Stay: Payer: Medicare HMO | Attending: Internal Medicine

## 2016-01-21 VITALS — BP 131/67 | HR 65 | Temp 96.6°F | Resp 16 | Wt 208.2 lb

## 2016-01-21 DIAGNOSIS — I10 Essential (primary) hypertension: Secondary | ICD-10-CM

## 2016-01-21 DIAGNOSIS — Z79899 Other long term (current) drug therapy: Secondary | ICD-10-CM | POA: Diagnosis not present

## 2016-01-21 DIAGNOSIS — D649 Anemia, unspecified: Secondary | ICD-10-CM

## 2016-01-21 DIAGNOSIS — G4730 Sleep apnea, unspecified: Secondary | ICD-10-CM

## 2016-01-21 DIAGNOSIS — D709 Neutropenia, unspecified: Secondary | ICD-10-CM

## 2016-01-21 DIAGNOSIS — E119 Type 2 diabetes mellitus without complications: Secondary | ICD-10-CM | POA: Diagnosis not present

## 2016-01-21 DIAGNOSIS — I2510 Atherosclerotic heart disease of native coronary artery without angina pectoris: Secondary | ICD-10-CM

## 2016-01-21 DIAGNOSIS — Z7984 Long term (current) use of oral hypoglycemic drugs: Secondary | ICD-10-CM | POA: Diagnosis not present

## 2016-01-21 DIAGNOSIS — Z7982 Long term (current) use of aspirin: Secondary | ICD-10-CM

## 2016-01-21 DIAGNOSIS — R61 Generalized hyperhidrosis: Secondary | ICD-10-CM | POA: Diagnosis not present

## 2016-01-21 DIAGNOSIS — M1612 Unilateral primary osteoarthritis, left hip: Secondary | ICD-10-CM | POA: Insufficient documentation

## 2016-01-21 DIAGNOSIS — I739 Peripheral vascular disease, unspecified: Secondary | ICD-10-CM | POA: Diagnosis not present

## 2016-01-21 LAB — CBC WITH DIFFERENTIAL/PLATELET
BASOS PCT: 1 %
Basophils Absolute: 0 10*3/uL (ref 0–0.1)
Eosinophils Absolute: 0.3 10*3/uL (ref 0–0.7)
Eosinophils Relative: 10 %
HCT: 34.5 % — ABNORMAL LOW (ref 40.0–52.0)
Hemoglobin: 11.8 g/dL — ABNORMAL LOW (ref 13.0–18.0)
LYMPHS PCT: 55 %
Lymphs Abs: 1.6 10*3/uL (ref 1.0–3.6)
MCH: 30.6 pg (ref 26.0–34.0)
MCHC: 34.3 g/dL (ref 32.0–36.0)
MCV: 89.2 fL (ref 80.0–100.0)
MONO ABS: 0.3 10*3/uL (ref 0.2–1.0)
Monocytes Relative: 11 %
NEUTROS ABS: 0.6 10*3/uL — AB (ref 1.4–6.5)
NEUTROS PCT: 23 %
PLATELETS: 157 10*3/uL (ref 150–440)
RBC: 3.87 MIL/uL — ABNORMAL LOW (ref 4.40–5.90)
RDW: 13.8 % (ref 11.5–14.5)
WBC: 2.8 10*3/uL — ABNORMAL LOW (ref 3.8–10.6)

## 2016-01-21 MED ORDER — TBO-FILGRASTIM 480 MCG/0.8ML ~~LOC~~ SOSY
480.0000 ug | PREFILLED_SYRINGE | Freq: Once | SUBCUTANEOUS | Status: AC
  Administered 2016-01-21: 480 ug via SUBCUTANEOUS
  Filled 2016-01-21: qty 0.8

## 2016-01-21 NOTE — Progress Notes (Signed)
[place]  [phone] [phone]     ID: Brandon Wilkerson OB: 30-Aug-1941  MR#: [id number]  CSN#:[id number]  Patient Care Team: Jodi Marble, MD as PCP - General (Internal Medicine)  CHIEF COMPLAINT/DIAGNOSIS:  Leukopenia/Neutropenia, Mild Anemia - Mild, asymptomatic, of unclear etiology. Workup so far unremarkable as detailed below.  Bone marrow biopsy 05/10/13 - no diagnostic morphologic evidence of B-cell lymphoproliferative disorder or other hematopoietic neoplasia identified.  Normocellular to slightly hypercellular marrow for age 9-50% with maturing trilineage hematopoiesis, storage iron present, slight patchy increase in reticulin  Flow study negative for any B-cell monoclonality, nonspecific myeloid and normocytic findings with no increase in blasts,relatively increased eosinophils 7% is nonspecific,relatively increased normal B-cell precursors (hematogones)and.  Normal male karyotype, 46XY.  Labs done on 11/02/12 - Hb 13.3, platelets 172K, retic 0.036, WBC 3400 with 38% neutrophils, 35% lymphocytes, 10% variant lymphocytes, 12% monocytes. Iron study, B12, folate, ANA, serum immunoelectrophoresis (SIEP), PT, PTT, LDH, haptoglobin, Coombs test, HBsAg, HCV antibody, and HIV antibody all unremarkable.   05/03/13 - Peripheral blood Flow Cytometry. A very small (0.3% of leukocytes) B cell clone is detected. Previous phenotyping results showed similar findings. The clone is very small and is not diagnostic of any specific type of lymphoma. If the patient has a previous history of lymphoma, the finding is consistent with persistent involvement by lymphoma. 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  07/23/15 -  Bone Marrow Biopsy Report  -  nonspecific marrow findings with no diagnostic morphologic or immunophenotypic evidence of hematopoietic neoplasia. Mildly hypercellular marrow for age (40-50%) with myeloid hypoplasia,  increased eosinophils, erythroid hyperplasia with mild nonspecific dyserythropoiesis and mild megakaryocytic atypia. Diffuse mild to focally moderate increase in reticulin. Storage iron present. Flow cytometry reports relatively decreased neutrophilic cells with no immunophenotypic abnormalities or increase in blasts, relatively increased eosinophils (17%) nonspecific. Cytogenetics normal 46XY. SNP microarray result is Normal Male. The treatment with short course of steroids was attempted in November 2016, however there was no response. Treatment with G-CSF was initiated in early December 2016  HISTORY OF PRESENT ILLNESS:  Brandon Wilkerson returns to our clinic for follow-up visit. He has done very well clinically since his previous appointment. His last injections of G-CSF were administered 4 weeks ago. He denies fevers, chills, nausea, vomiting, diarrhea, night sweats, cough. He is able to perform all activities of daily living, including strenuous exercise, such as gardening with very minimal limitations.    REVIEW  OF SYSTEMS:   ROS As in HPI above. In addition, no new headaches or focal weakness.  No sore throat, cough, shortness of breath, sputum, hemoptysis or chest pain. No abdominal pain, constipation, diarrhea, dysuria or hematuria.   PAST MEDICAL HISTORY: Reviewed.         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 As above.  FAMILY HISTORY: Reviewed. Remarkable for diabetes, heart disease, hypertension, breast cancer.   SOCIAL HISTORY: Reviewed. Nonsmoker.  Denies alcohol or recreational drug usage.    Allergies  Allergen Reactions  . Pollen Extract Other (See Comments)    Runny nose    Current Outpatient Prescriptions  Medication Sig Dispense Refill  . acyclovir (ZOVIRAX)  400 MG tablet Take 1 tablet (400 mg total) by mouth 2 (two) times daily. 60 tablet 5  . amLODipine (  NORVASC) 10 MG tablet Take 10 mg by mouth daily.    Marland Kitchen aspirin 81 MG tablet Take 81 mg by mouth daily.    . cloNIDine (CATAPRES) 0.3 MG tablet Take 0.3 mg by mouth 2 (two) times daily.    . clopidogrel (PLAVIX) 75 MG tablet Take 75 mg by mouth daily.    . fluconazole (DIFLUCAN) 200 MG tablet Take 1 tablet (200 mg total) by mouth daily. 30 tablet 2  . fluticasone (FLONASE) 50 MCG/ACT nasal spray Place 1 spray into both nostrils daily.    Marland Kitchen gabapentin (NEURONTIN) 600 MG tablet Take 600 mg by mouth 3 (three) times daily.    Marland Kitchen glipiZIDE (GLUCOTROL) 5 MG tablet Take 2.5 mg by mouth 2 (two) times daily.    . isosorbide mononitrate (IMDUR) 60 MG 24 hr tablet Take 60 mg by mouth daily.    Marland Kitchen levofloxacin (LEVAQUIN) 500 MG tablet Take 1 tablet (500 mg total) by mouth daily. 30 tablet 2  . metFORMIN (GLUCOPHAGE) 1000 MG tablet Take 1,000 mg by mouth 2 (two) times daily with a meal.    . nitroGLYCERIN (NITROSTAT) 0.4 MG SL tablet Place 0.4 mg under the tongue every 5 (five) minutes as needed for chest pain.    . ONE TOUCH ULTRA TEST test strip use as directed;  twice a day OR MORE if needed  1  . rosuvastatin (CRESTOR) 40 MG tablet   0  . valsartan-hydrochlorothiazide (DIOVAN-HCT) 320-25 MG tablet Take 1 tablet by mouth daily.  0   No current facility-administered medications for this visit.    PHYSICAL EXAM: Filed Vitals:   01/21/16 1508  BP: 131/67  Pulse: 65  Temp: 96.6 F (35.9 C)  Resp: 16     Body mass index is 27.47 kg/(m^2).     BP 131/67 mmHg  Pulse 65  Temp(Src) 96.6 F (35.9 C) (Tympanic)  Resp 16  Wt 208 lb 3 oz (94.433 kg)  General Appearance:    Alert, cooperative, no distress, appears stated age  Head:    Normocephalic, without obvious abnormality, atraumatic  Eyes:    PERRL, conjunctiva/corneas clear, EOM's intact, fundi    benign, both eyes       Ears:    Normal TM's and  external ear canals, both ears  Nose:   Nares normal, septum midline, mucosa normal, no drainage   or sinus tenderness  Throat:   Lips, mucosa, and tongue normal; teeth and gums normal  Neck:   Supple, symmetrical, trachea midline, no adenopathy;       thyroid:  No enlargement/tenderness/nodules; carotid   bruit on L  Back:     Symmetric, no curvature, ROM normal, no CVA tenderness  Lungs:     Clear to auscultation bilaterally, respirations unlabored  Chest wall:    No tenderness or deformity  Heart:    Regular rate and rhythm, S1 and S2 normal, systolic ejection murmur 3 out of 6 on aorta   Abdomen:     Soft, non-tender, bowel sounds active all four quadrants,    no masses, no organomegaly  Extremities:   Extremities normal, atraumatic, no cyanosis or edema  Pulses:   2+ and symmetric all extremities  Skin:   Skin color, texture, turgor normal, no rashes or lesions  Lymph nodes:   Cervical, supraclavicular, and axillary nodes normal  Neurologic:   CNII-XII intact. Normal strength, sensation and reflexes      throughout     LAB RESULTS: Recent Results (from the past  2160 hour(s))  CBC with Differential     Status: Abnormal   Collection Time: 10/29/15  2:52 PM  Result Value Ref Range   WBC 2.1 (L) 3.8 - 10.6 K/uL   RBC 3.90 (L) 4.40 - 5.90 MIL/uL   Hemoglobin 12.0 (L) 13.0 - 18.0 g/dL   HCT 34.1 (L) 40.0 - 52.0 %   MCV 87.5 80.0 - 100.0 fL   MCH 30.7 26.0 - 34.0 pg   MCHC 35.1 32.0 - 36.0 g/dL   RDW 13.6 11.5 - 14.5 %   Platelets 183 150 - 440 K/uL   Neutrophils Relative % 17 %   Neutro Abs 0.4 (L) 1.4 - 6.5 K/uL    Comment: RESULT REPEATED AND VERIFIED CRITICAL RESULT CALLED TO, READ BACK BY AND VERIFIED WITH: ANITA BLACK AT 1504 10/29/2015 KMR    Lymphocytes Relative 52 %   Lymphs Abs 1.1 1.0 - 3.6 K/uL   Monocytes Relative 18 %   Monocytes Absolute 0.4 0.2 - 1.0 K/uL   Eosinophils Relative 11 %   Eosinophils Absolute 0.2 0 - 0.7 K/uL   Basophils Relative 2 %    Basophils Absolute 0.0 0 - 0.1 K/uL  CBC with Differential/Platelet     Status: Abnormal   Collection Time: 11/04/15 10:12 AM  Result Value Ref Range   WBC 2.1 (L) 3.8 - 10.6 K/uL   RBC 4.13 (L) 4.40 - 5.90 MIL/uL   Hemoglobin 12.6 (L) 13.0 - 18.0 g/dL   HCT 36.5 (L) 40.0 - 52.0 %   MCV 88.4 80.0 - 100.0 fL   MCH 30.4 26.0 - 34.0 pg   MCHC 34.4 32.0 - 36.0 g/dL   RDW 13.9 11.5 - 14.5 %   Platelets 181 150 - 440 K/uL   Neutrophils Relative % 17 %   Neutro Abs 0.4 (L) 1.4 - 6.5 K/uL    Comment: RESULT REPEATED AND VERIFIED CRITICAL RESULT CALLED TO, READ BACK BY AND VERIFIED WITH: SHERRY VENABLE AT 1035 11/04/2015 KMR    Lymphocytes Relative 49 %   Lymphs Abs 1.1 1.0 - 3.6 K/uL   Monocytes Relative 19 %   Monocytes Absolute 0.4 0.2 - 1.0 K/uL   Eosinophils Relative 13 %   Eosinophils Absolute 0.3 0 - 0.7 K/uL   Basophils Relative 2 %   Basophils Absolute 0.0 0 - 0.1 K/uL  Miscellaneous [facility] test (send-out)     Status: None   Collection Time: 11/04/15 10:12 AM  Result Value Ref Range   [facility] test code [id number]    [facility] test name [id number]    Misc [facility] result COMMENT     Comment: (NOTE) Test Ordered: 119417 T-Cell Gene Rearrangement, PCR T-Cell PCR Interpretation      Comment                   YU   NEGATIVE: No clonal T-cell receptor gamma (TCRG) population was          detected Interpretation of this result should be made in the context of other clinical, morphologic, and immunophenotypic findings. This PCR assay detects approximately 89% of T-cell clonal populations but as with all amplification based assays, may not detect all possible T- cell receptor gene rearrangements. If significant suspicion of clonality remains, T-cell receptor beta (TCRB, test code [id number]) test is recommended. The combined T-cell clonality detection rate of TCRG and TCRB is 94-99%. The T-cell receptor gene rearrangement panel (includung both TCRG and TCRB, test code [phone]) is available  for parallel  testing. Director Review:               Comment                   YU   Constance Goltz, PhD, [facility]               Director, [place] for [employer]               Biology and [place], [place]               [phone]    Methodology:               Comment                   YU   Patient cellular DNA is subjected to polymerase chain reaction using 2 optimized primer sets that target multiple V and J exon regions within the T cell receptor gamma chain gene on chromosome 7. Rearrangements are detected by identification of specific clonal amplicon products following electrophoresis.  This PCR assay is capable of detecting a clonal population at a sensitivity of 5 clonal cells per 100 normal cells.     References:               Comment                   YU   Arber A (2000) [employer] [phone]. Terrial Rhodes. ([place]) Circle [phone]. Miller JE et al. (1999) Molecular Diagnosis 4:101-117. This test was developed and its performance characteristics determined by [facility].  It has not been cleared or approved by the Food and Drug Administration.   The FDA has determined that such clearance or approval is not necessary. Performed At: [facility] [place], [place] [id number] Lindon Romp MD GD:[id number] Performed At: [facility] [address] [place], [place] [id number] Nechama Guard MD QJ:[id number]   CBC with Differential/Platelet     Status: Abnormal   Collection Time: 11/08/15 10:09 AM  Result Value Ref Range   WBC 2.1 (L) 3.8 - 10.6 K/uL   RBC 4.10 (L) 4.40 - 5.90 MIL/uL   Hemoglobin 12.5 (L) 13.0 - 18.0 g/dL   HCT 35.7 (L) 40.0 - 52.0 %   MCV 87.0 80.0 - 100.0 fL   MCH 30.4 26.0 - 34.0 pg   MCHC 35.0 32.0 - 36.0 g/dL   RDW 13.8 11.5 - 14.5 %   Platelets 194 150 - 440 K/uL   Neutrophils Relative % 26 %   Neutro Abs 0.5  (L) 1.4 - 6.5 K/uL   Lymphocytes Relative 43 %   Lymphs Abs 0.9 (L) 1.0 - 3.6 K/uL   Monocytes Relative 29 %   Monocytes Absolute 0.6 0.2 - 1.0 K/uL   Eosinophils Relative 1 %   Eosinophils Absolute 0.0 0 - 0.7 K/uL   Basophils Relative 1 %   Basophils Absolute 0.0 0 - 0.1 K/uL  CBC with Differential/Platelet     Status: Abnormal   Collection Time: 11/18/15  1:50 PM  Result Value Ref Range   WBC 2.1 (L) 3.8 - 10.6 K/uL   RBC 3.82 (L) 4.40 - 5.90 MIL/uL   Hemoglobin 11.5 (L) 13.0 -  18.0 g/dL   HCT 33.6 (L) 40.0 - 52.0 %   MCV 87.9 80.0 - 100.0 fL   MCH 30.1 26.0 - 34.0 pg   MCHC 34.3 32.0 - 36.0 g/dL   RDW 13.6 11.5 - 14.5 %   Platelets 156 150 - 440 K/uL   Neutrophils Relative % 29 %   Neutro Abs 0.6 (L) 1.4 - 6.5 K/uL   Lymphocytes Relative 44 %   Lymphs Abs 0.9 (L) 1.0 - 3.6 K/uL   Monocytes Relative 16 %   Monocytes Absolute 0.3 0.2 - 1.0 K/uL   Eosinophils Relative 10 %   Eosinophils Absolute 0.2 0 - 0.7 K/uL   Basophils Relative 1 %   Basophils Absolute 0.0 0 - 0.1 K/uL  CBC with Differential/Platelet     Status: Abnormal   Collection Time: 11/22/15  2:07 PM  Result Value Ref Range   WBC 2.9 (L) 3.8 - 10.6 K/uL   RBC 3.58 (L) 4.40 - 5.90 MIL/uL   Hemoglobin 10.6 (L) 13.0 - 18.0 g/dL   HCT 31.7 (L) 40.0 - 52.0 %   MCV 88.7 80.0 - 100.0 fL   MCH 29.7 26.0 - 34.0 pg   MCHC 33.5 32.0 - 36.0 g/dL   RDW 13.6 11.5 - 14.5 %   Platelets 136 (L) 150 - 440 K/uL   Neutrophils Relative % 48 %   Neutro Abs 1.4 1.4 - 6.5 K/uL   Lymphocytes Relative 23 %   Lymphs Abs 0.6 (L) 1.0 - 3.6 K/uL   Monocytes Relative 24 %   Monocytes Absolute 0.7 0.2 - 1.0 K/uL   Eosinophils Relative 4 %   Eosinophils Absolute 0.1 0 - 0.7 K/uL   Basophils Relative 1 %   Basophils Absolute 0.0 0 - 0.1 K/uL  CBC with Differential/Platelet     Status: Abnormal   Collection Time: 11/25/15 10:17 AM  Result Value Ref Range   WBC 3.9 3.8 - 10.6 K/uL   RBC 3.71 (L) 4.40 - 5.90 MIL/uL   Hemoglobin 11.4  (L) 13.0 - 18.0 g/dL   HCT 32.3 (L) 40.0 - 52.0 %   MCV 87.2 80.0 - 100.0 fL   MCH 30.7 26.0 - 34.0 pg   MCHC 35.2 32.0 - 36.0 g/dL   RDW 13.6 11.5 - 14.5 %   Platelets 140 (L) 150 - 440 K/uL   Neutrophils Relative % 60 %   Neutro Abs 2.4 1.4 - 6.5 K/uL   Lymphocytes Relative 22 %   Lymphs Abs 0.8 (L) 1.0 - 3.6 K/uL   Monocytes Relative 12 %   Monocytes Absolute 0.5 0.2 - 1.0 K/uL   Eosinophils Relative 5 %   Eosinophils Absolute 0.2 0 - 0.7 K/uL   Basophils Relative 1 %   Basophils Absolute 0.0 0 - 0.1 K/uL  CBC with Differential     Status: Abnormal   Collection Time: 11/27/15  9:32 AM  Result Value Ref Range   WBC 4.1 3.8 - 10.6 K/uL   RBC 3.75 (L) 4.40 - 5.90 MIL/uL   Hemoglobin 11.3 (L) 13.0 - 18.0 g/dL   HCT 32.7 (L) 40.0 - 52.0 %   MCV 87.4 80.0 - 100.0 fL   MCH 30.2 26.0 - 34.0 pg   MCHC 34.6 32.0 - 36.0 g/dL   RDW 13.5 11.5 - 14.5 %   Platelets 148 (L) 150 - 440 K/uL   Neutrophils Relative % 62 %   Neutro Abs 2.6 1.4 - 6.5 K/uL   Lymphocytes  Relative 24 %   Lymphs Abs 1.0 1.0 - 3.6 K/uL   Monocytes Relative 7 %   Monocytes Absolute 0.3 0.2 - 1.0 K/uL   Eosinophils Relative 6 %   Eosinophils Absolute 0.2 0 - 0.7 K/uL   Basophils Relative 1 %   Basophils Absolute 0.0 0 - 0.1 K/uL  CBC with Differential/Platelet     Status: Abnormal   Collection Time: 12/04/15  2:22 PM  Result Value Ref Range   WBC 4.0 3.8 - 10.6 K/uL   RBC 3.81 (L) 4.40 - 5.90 MIL/uL   Hemoglobin 11.6 (L) 13.0 - 18.0 g/dL   HCT 33.6 (L) 40.0 - 52.0 %   MCV 88.0 80.0 - 100.0 fL   MCH 30.5 26.0 - 34.0 pg   MCHC 34.6 32.0 - 36.0 g/dL   RDW 13.9 11.5 - 14.5 %   Platelets 223 150 - 440 K/uL   Neutrophils Relative % 60 %   Neutro Abs 2.5 1.4 - 6.5 K/uL   Lymphocytes Relative 25 %   Lymphs Abs 1.0 1.0 - 3.6 K/uL   Monocytes Relative 8 %   Monocytes Absolute 0.3 0.2 - 1.0 K/uL   Eosinophils Relative 5 %   Eosinophils Absolute 0.2 0 - 0.7 K/uL   Basophils Relative 2 %   Basophils Absolute 0.1  0 - 0.1 K/uL  CBC with Differential     Status: Abnormal   Collection Time: 12/18/15  8:45 AM  Result Value Ref Range   WBC 2.2 (L) 3.8 - 10.6 K/uL   RBC 3.83 (L) 4.40 - 5.90 MIL/uL   Hemoglobin 11.6 (L) 13.0 - 18.0 g/dL   HCT 33.8 (L) 40.0 - 52.0 %   MCV 88.4 80.0 - 100.0 fL   MCH 30.4 26.0 - 34.0 pg   MCHC 34.4 32.0 - 36.0 g/dL   RDW 14.5 11.5 - 14.5 %   Platelets 165 150 - 440 K/uL   Neutrophils Relative % 18 %   Neutro Abs 0.4 (L) 1.4 - 6.5 K/uL    Comment: RESULT REPEATED AND VERIFIED CRITICAL RESULT CALLED TO, READ BACK BY AND VERIFIED WITH: NYO/KMR TO SHERRY VENABLE 12/18/15 0854    Lymphocytes Relative 45 %   Lymphs Abs 1.0 1.0 - 3.6 K/uL   Monocytes Relative 20 %   Monocytes Absolute 0.4 0.2 - 1.0 K/uL   Eosinophils Relative 15 %   Eosinophils Absolute 0.3 0 - 0.7 K/uL   Basophils Relative 2 %   Basophils Absolute 0.0 0 - 0.1 K/uL  CBC with Differential     Status: Abnormal   Collection Time: 01/01/16  9:04 AM  Result Value Ref Range   WBC 4.5 3.8 - 10.6 K/uL   RBC 3.79 (L) 4.40 - 5.90 MIL/uL   Hemoglobin 11.8 (L) 13.0 - 18.0 g/dL   HCT 33.5 (L) 40.0 - 52.0 %   MCV 88.4 80.0 - 100.0 fL   MCH 31.2 26.0 - 34.0 pg   MCHC 35.3 32.0 - 36.0 g/dL   RDW 14.2 11.5 - 14.5 %   Platelets 183 150 - 440 K/uL   Neutrophils Relative % 61 %   Neutro Abs 2.7 1.4 - 6.5 K/uL   Lymphocytes Relative 24 %   Lymphs Abs 1.1 1.0 - 3.6 K/uL   Monocytes Relative 5 %   Monocytes Absolute 0.2 0.2 - 1.0 K/uL   Eosinophils Relative 9 %   Eosinophils Absolute 0.4 0 - 0.7 K/uL   Basophils Relative 1 %  Basophils Absolute 0.1 0 - 0.1 K/uL  CBC with Differential/Platelet     Status: Abnormal   Collection Time: 01/21/16  2:45 PM  Result Value Ref Range   WBC 2.8 (L) 3.8 - 10.6 K/uL   RBC 3.87 (L) 4.40 - 5.90 MIL/uL   Hemoglobin 11.8 (L) 13.0 - 18.0 g/dL   HCT 34.5 (L) 40.0 - 52.0 %   MCV 89.2 80.0 - 100.0 fL   MCH 30.6 26.0 - 34.0 pg   MCHC 34.3 32.0 - 36.0 g/dL   RDW 13.8 11.5 - 14.5  %   Platelets 157 150 - 440 K/uL   Neutrophils Relative % 23 %   Neutro Abs 0.6 (L) 1.4 - 6.5 K/uL   Lymphocytes Relative 55 %   Lymphs Abs 1.6 1.0 - 3.6 K/uL   Monocytes Relative 11 %   Monocytes Absolute 0.3 0.2 - 1.0 K/uL   Eosinophils Relative 10 %   Eosinophils Absolute 0.3 0 - 0.7 K/uL   Basophils Relative 1 %   Basophils Absolute 0.0 0 - 0.1 K/uL     STUDIES: No results found. 07/23/15 -  Bone Marrow Biopsy Report  -  nonspecific marrow findings with no diagnostic morphologic or immunophenotypic evidence of hematopoietic neoplasia. Mildly hypercellular marrow for age (40-50%) with myeloid hypoplasia, increased eosinophils, erythroid hyperplasia with mild nonspecific dyserythropoiesis and mild megakaryocytic atypia. Diffuse mild to focally moderate increase in reticulin. Storage iron present. Flow cytometry reports relatively decreased neutrophilic cells with no immunophenotypic abnormalities or increase in blasts, relatively increased eosinophils (17%) nonspecific. Cytogenetics normal 46XY. SNP microarray result is Normal Male.    ASSESSMENT / PLAN:   1. Leukopenia/Neutropenia  of unclear origin-as previously stated, extensive workup including multiple bone marrow biopsies failed to reveal clear etiology of persistent neutropenia.  Over the past 2 months Brandon Wilkerson should good response to Neupogen, with improvement of the dorsal count and absolute neutrophil count or 3+ weeks after 2 consecutive injections. At this time his absolute neutrophil count is still within a safe range, but is clearly declining. Take into account the fact that Brandon Wilkerson likes to stay active, especially working in the garden, it makes sense to intensify the treatment somewhat, and administer 2 consecutive injections of Neupogen every 3 weeks, which hopefully will keep his absolute neutrophil count above 1000 cells per microliter all the time, therefore ensuring the safety of his gardening and  activities. 2. Anemia, thrombocytopenia, night sweats - anemia remained stable, thrombocytopenia has resolved. The patient is a symptomatic. Certainly, the concern about developing underlying hematologic malignancy is real, as such we will keep the possibility of doing another bone marrow biopsy in mind if his hematologic parameters change dramatically or if he develops additional symptoms. In the meantime we will continue with supportive care. 3. In between visits, the patient has been advised to call or come to the ER in case of fevers, chills, bleeding, acute sickness or new symptoms and will be evaluated sooner. Patient is agreeable to this plan. He will return to our clinic every 3 weeks for Neupogen, given for 2 consecutive days, and we will monitor his CBC every 2 weeks. He will return to our clinic for a follow-up visit in 9 weeks or sooner if needed.  I spent 25 minutes with Brandon Wilkerson, with more than 50% of the time spent on organizing care and counseling.   Roxana Hires, MD   01/21/2016 2:50 PM

## 2016-01-22 ENCOUNTER — Inpatient Hospital Stay: Payer: Medicare HMO

## 2016-01-22 DIAGNOSIS — D709 Neutropenia, unspecified: Secondary | ICD-10-CM | POA: Diagnosis not present

## 2016-01-22 MED ORDER — TBO-FILGRASTIM 480 MCG/0.8ML ~~LOC~~ SOSY
480.0000 ug | PREFILLED_SYRINGE | Freq: Once | SUBCUTANEOUS | Status: AC
  Administered 2016-01-22: 480 ug via SUBCUTANEOUS
  Filled 2016-01-22: qty 0.8

## 2016-02-04 ENCOUNTER — Inpatient Hospital Stay: Payer: Medicare HMO

## 2016-02-04 DIAGNOSIS — D709 Neutropenia, unspecified: Secondary | ICD-10-CM

## 2016-02-04 LAB — CBC WITH DIFFERENTIAL/PLATELET
BASOS ABS: 0.1 10*3/uL (ref 0–0.1)
Basophils Relative: 2 %
Eosinophils Absolute: 0.2 10*3/uL (ref 0–0.7)
Eosinophils Relative: 6 %
HEMATOCRIT: 31.2 % — AB (ref 40.0–52.0)
HEMOGLOBIN: 11 g/dL — AB (ref 13.0–18.0)
LYMPHS PCT: 34 %
Lymphs Abs: 1 10*3/uL (ref 1.0–3.6)
MCH: 30.7 pg (ref 26.0–34.0)
MCHC: 35.1 g/dL (ref 32.0–36.0)
MCV: 87.5 fL (ref 80.0–100.0)
Monocytes Absolute: 0.2 10*3/uL (ref 0.2–1.0)
Monocytes Relative: 7 %
NEUTROS ABS: 1.5 10*3/uL (ref 1.4–6.5)
Neutrophils Relative %: 51 %
Platelets: 169 10*3/uL (ref 150–440)
RBC: 3.57 MIL/uL — AB (ref 4.40–5.90)
RDW: 13.8 % (ref 11.5–14.5)
WBC: 2.9 10*3/uL — AB (ref 3.8–10.6)

## 2016-02-11 ENCOUNTER — Inpatient Hospital Stay: Payer: Medicare HMO

## 2016-02-11 ENCOUNTER — Inpatient Hospital Stay: Payer: Medicare HMO | Attending: Family Medicine

## 2016-02-11 VITALS — BP 130/61 | HR 54 | Temp 97.0°F | Resp 18

## 2016-02-11 DIAGNOSIS — Z7689 Persons encountering health services in other specified circumstances: Secondary | ICD-10-CM | POA: Diagnosis not present

## 2016-02-11 DIAGNOSIS — D709 Neutropenia, unspecified: Secondary | ICD-10-CM | POA: Insufficient documentation

## 2016-02-11 DIAGNOSIS — D649 Anemia, unspecified: Secondary | ICD-10-CM | POA: Diagnosis not present

## 2016-02-11 LAB — CBC WITH DIFFERENTIAL/PLATELET
BASOS ABS: 0.1 10*3/uL (ref 0–0.1)
BASOS PCT: 3 %
EOS PCT: 11 %
Eosinophils Absolute: 0.2 10*3/uL (ref 0–0.7)
HEMATOCRIT: 30.6 % — AB (ref 40.0–52.0)
Hemoglobin: 10.6 g/dL — ABNORMAL LOW (ref 13.0–18.0)
Lymphocytes Relative: 55 %
Lymphs Abs: 1.1 10*3/uL (ref 1.0–3.6)
MCH: 30.9 pg (ref 26.0–34.0)
MCHC: 34.7 g/dL (ref 32.0–36.0)
MCV: 88.9 fL (ref 80.0–100.0)
MONO ABS: 0.4 10*3/uL (ref 0.2–1.0)
Monocytes Relative: 21 %
NEUTROS PCT: 10 %
Neutro Abs: 0.2 10*3/uL — ABNORMAL LOW (ref 1.4–6.5)
PLATELETS: 187 10*3/uL (ref 150–440)
RBC: 3.44 MIL/uL — AB (ref 4.40–5.90)
RDW: 13.4 % (ref 11.5–14.5)
WBC: 2.1 10*3/uL — AB (ref 3.8–10.6)

## 2016-02-11 MED ORDER — TBO-FILGRASTIM 480 MCG/0.8ML ~~LOC~~ SOSY
480.0000 ug | PREFILLED_SYRINGE | Freq: Once | SUBCUTANEOUS | Status: AC
  Administered 2016-02-11: 480 ug via SUBCUTANEOUS
  Filled 2016-02-11: qty 0.8

## 2016-02-12 ENCOUNTER — Inpatient Hospital Stay: Payer: Medicare HMO

## 2016-02-12 VITALS — BP 139/61 | HR 57 | Temp 95.2°F | Resp 20

## 2016-02-12 DIAGNOSIS — D709 Neutropenia, unspecified: Secondary | ICD-10-CM

## 2016-02-12 MED ORDER — TBO-FILGRASTIM 480 MCG/0.8ML ~~LOC~~ SOSY
480.0000 ug | PREFILLED_SYRINGE | Freq: Once | SUBCUTANEOUS | Status: AC
  Administered 2016-02-12: 480 ug via SUBCUTANEOUS
  Filled 2016-02-12: qty 0.8

## 2016-02-25 ENCOUNTER — Inpatient Hospital Stay: Payer: Medicare HMO

## 2016-02-25 DIAGNOSIS — D709 Neutropenia, unspecified: Secondary | ICD-10-CM | POA: Diagnosis not present

## 2016-02-25 LAB — CBC WITH DIFFERENTIAL/PLATELET
BASOS ABS: 0.1 10*3/uL (ref 0–0.1)
BASOS PCT: 2 %
EOS PCT: 5 %
Eosinophils Absolute: 0.2 10*3/uL (ref 0–0.7)
HCT: 30.9 % — ABNORMAL LOW (ref 40.0–52.0)
Hemoglobin: 10.8 g/dL — ABNORMAL LOW (ref 13.0–18.0)
LYMPHS PCT: 24 %
Lymphs Abs: 1 10*3/uL (ref 1.0–3.6)
MCH: 30.7 pg (ref 26.0–34.0)
MCHC: 35 g/dL (ref 32.0–36.0)
MCV: 87.7 fL (ref 80.0–100.0)
MONO ABS: 0.2 10*3/uL (ref 0.2–1.0)
Monocytes Relative: 6 %
NEUTROS ABS: 2.5 10*3/uL (ref 1.4–6.5)
Neutrophils Relative %: 63 %
PLATELETS: 162 10*3/uL (ref 150–440)
RBC: 3.53 MIL/uL — AB (ref 4.40–5.90)
RDW: 13.5 % (ref 11.5–14.5)
WBC: 4 10*3/uL (ref 3.8–10.6)

## 2016-03-03 ENCOUNTER — Other Ambulatory Visit: Payer: Self-pay | Admitting: Family Medicine

## 2016-03-03 ENCOUNTER — Inpatient Hospital Stay: Payer: Medicare HMO

## 2016-03-03 NOTE — Progress Notes (Signed)
Patient's labs are normal, spoke with Brandon SpencerLeslie H. - no Granix injection today or tomorrow, patient scheduled to come for labs next week and possible Granix injection at that time.  LJ

## 2016-03-04 ENCOUNTER — Inpatient Hospital Stay: Payer: Medicare HMO

## 2016-03-12 ENCOUNTER — Inpatient Hospital Stay: Payer: Medicare HMO

## 2016-03-12 ENCOUNTER — Inpatient Hospital Stay: Payer: Medicare HMO | Attending: Family Medicine

## 2016-03-12 VITALS — BP 150/63 | HR 62 | Temp 96.0°F | Resp 18

## 2016-03-12 DIAGNOSIS — D709 Neutropenia, unspecified: Secondary | ICD-10-CM

## 2016-03-12 DIAGNOSIS — I10 Essential (primary) hypertension: Secondary | ICD-10-CM | POA: Insufficient documentation

## 2016-03-12 DIAGNOSIS — M1990 Unspecified osteoarthritis, unspecified site: Secondary | ICD-10-CM | POA: Insufficient documentation

## 2016-03-12 DIAGNOSIS — Z7689 Persons encountering health services in other specified circumstances: Secondary | ICD-10-CM | POA: Insufficient documentation

## 2016-03-12 DIAGNOSIS — Z79899 Other long term (current) drug therapy: Secondary | ICD-10-CM | POA: Insufficient documentation

## 2016-03-12 DIAGNOSIS — Z7984 Long term (current) use of oral hypoglycemic drugs: Secondary | ICD-10-CM | POA: Insufficient documentation

## 2016-03-12 DIAGNOSIS — E119 Type 2 diabetes mellitus without complications: Secondary | ICD-10-CM | POA: Insufficient documentation

## 2016-03-12 DIAGNOSIS — Z7982 Long term (current) use of aspirin: Secondary | ICD-10-CM | POA: Diagnosis not present

## 2016-03-12 DIAGNOSIS — D708 Other neutropenia: Secondary | ICD-10-CM | POA: Insufficient documentation

## 2016-03-12 DIAGNOSIS — I739 Peripheral vascular disease, unspecified: Secondary | ICD-10-CM | POA: Insufficient documentation

## 2016-03-12 DIAGNOSIS — Z95818 Presence of other cardiac implants and grafts: Secondary | ICD-10-CM | POA: Insufficient documentation

## 2016-03-12 DIAGNOSIS — I2510 Atherosclerotic heart disease of native coronary artery without angina pectoris: Secondary | ICD-10-CM | POA: Diagnosis not present

## 2016-03-12 DIAGNOSIS — D649 Anemia, unspecified: Secondary | ICD-10-CM | POA: Diagnosis not present

## 2016-03-12 LAB — CBC WITH DIFFERENTIAL/PLATELET
BASOS ABS: 0 10*3/uL (ref 0–0.1)
Basophils Relative: 2 %
Eosinophils Absolute: 0.2 10*3/uL (ref 0–0.7)
Eosinophils Relative: 10 %
HEMATOCRIT: 32.7 % — AB (ref 40.0–52.0)
Hemoglobin: 11.6 g/dL — ABNORMAL LOW (ref 13.0–18.0)
LYMPHS ABS: 1.1 10*3/uL (ref 1.0–3.6)
LYMPHS PCT: 56 %
MCH: 30.7 pg (ref 26.0–34.0)
MCHC: 35.4 g/dL (ref 32.0–36.0)
MCV: 86.8 fL (ref 80.0–100.0)
MONO ABS: 0.4 10*3/uL (ref 0.2–1.0)
MONOS PCT: 19 %
NEUTROS ABS: 0.3 10*3/uL — AB (ref 1.7–7.7)
Neutrophils Relative %: 13 %
Platelets: 165 10*3/uL (ref 150–440)
RBC: 3.77 MIL/uL — ABNORMAL LOW (ref 4.40–5.90)
RDW: 13.4 % (ref 11.5–14.5)
WBC: 2 10*3/uL — ABNORMAL LOW (ref 3.8–10.6)

## 2016-03-12 MED ORDER — TBO-FILGRASTIM 480 MCG/0.8ML ~~LOC~~ SOSY
480.0000 ug | PREFILLED_SYRINGE | Freq: Once | SUBCUTANEOUS | Status: AC
  Administered 2016-03-12: 480 ug via SUBCUTANEOUS
  Filled 2016-03-12: qty 0.8

## 2016-03-13 ENCOUNTER — Inpatient Hospital Stay: Payer: Medicare HMO

## 2016-03-13 ENCOUNTER — Encounter: Payer: Self-pay | Admitting: *Deleted

## 2016-03-13 VITALS — BP 155/59 | HR 74 | Temp 97.3°F | Resp 18

## 2016-03-13 DIAGNOSIS — D708 Other neutropenia: Secondary | ICD-10-CM | POA: Diagnosis not present

## 2016-03-13 DIAGNOSIS — D709 Neutropenia, unspecified: Secondary | ICD-10-CM

## 2016-03-13 MED ORDER — TBO-FILGRASTIM 480 MCG/0.8ML ~~LOC~~ SOSY
480.0000 ug | PREFILLED_SYRINGE | Freq: Once | SUBCUTANEOUS | Status: AC
  Administered 2016-03-13: 480 ug via SUBCUTANEOUS
  Filled 2016-03-13: qty 0.8

## 2016-03-13 NOTE — Progress Notes (Signed)
Pt came to cancer center today for his granix injection. He wanted to clarify his apt in the clinic. He had an apt on 4/11 for lab only, which he would like to cnl and r/s to the following week. He desires to have his granix injection on 4/27 and 4/28.  Dr. Gretel AcreBerenzon recommended granix every 3 weeks.   I asked pt if he would be willing to meet with Dr. Donneta RombergBrahmanday on 03/26/16 to determine when his injection schedule is due.  He would like to proceed to meet with Dr. Leonard SchwartzB on 4/20 for lab/md/possible granix.

## 2016-03-17 ENCOUNTER — Inpatient Hospital Stay: Payer: Medicare HMO

## 2016-03-24 ENCOUNTER — Ambulatory Visit: Payer: Medicare HMO

## 2016-03-24 ENCOUNTER — Ambulatory Visit: Payer: Medicare HMO | Admitting: Family Medicine

## 2016-03-25 ENCOUNTER — Ambulatory Visit: Payer: Medicare HMO

## 2016-03-26 ENCOUNTER — Inpatient Hospital Stay: Payer: Medicare HMO

## 2016-03-26 ENCOUNTER — Inpatient Hospital Stay (HOSPITAL_BASED_OUTPATIENT_CLINIC_OR_DEPARTMENT_OTHER): Payer: Medicare HMO | Admitting: Internal Medicine

## 2016-03-26 VITALS — BP 129/68 | HR 64 | Temp 96.8°F | Resp 20 | Wt 207.2 lb

## 2016-03-26 DIAGNOSIS — D708 Other neutropenia: Secondary | ICD-10-CM

## 2016-03-26 DIAGNOSIS — I739 Peripheral vascular disease, unspecified: Secondary | ICD-10-CM

## 2016-03-26 DIAGNOSIS — Z95818 Presence of other cardiac implants and grafts: Secondary | ICD-10-CM

## 2016-03-26 DIAGNOSIS — Z7984 Long term (current) use of oral hypoglycemic drugs: Secondary | ICD-10-CM

## 2016-03-26 DIAGNOSIS — D649 Anemia, unspecified: Secondary | ICD-10-CM | POA: Diagnosis not present

## 2016-03-26 DIAGNOSIS — Z79899 Other long term (current) drug therapy: Secondary | ICD-10-CM

## 2016-03-26 DIAGNOSIS — Z7982 Long term (current) use of aspirin: Secondary | ICD-10-CM

## 2016-03-26 DIAGNOSIS — M1990 Unspecified osteoarthritis, unspecified site: Secondary | ICD-10-CM

## 2016-03-26 DIAGNOSIS — I10 Essential (primary) hypertension: Secondary | ICD-10-CM

## 2016-03-26 DIAGNOSIS — I2510 Atherosclerotic heart disease of native coronary artery without angina pectoris: Secondary | ICD-10-CM

## 2016-03-26 DIAGNOSIS — E119 Type 2 diabetes mellitus without complications: Secondary | ICD-10-CM

## 2016-03-26 LAB — COMPREHENSIVE METABOLIC PANEL
ALT: 19 U/L (ref 17–63)
AST: 25 U/L (ref 15–41)
Albumin: 4.4 g/dL (ref 3.5–5.0)
Alkaline Phosphatase: 82 U/L (ref 38–126)
Anion gap: 3 — ABNORMAL LOW (ref 5–15)
BILIRUBIN TOTAL: 0.6 mg/dL (ref 0.3–1.2)
BUN: 30 mg/dL — ABNORMAL HIGH (ref 6–20)
CO2: 24 mmol/L (ref 22–32)
CREATININE: 1.42 mg/dL — AB (ref 0.61–1.24)
Calcium: 9.1 mg/dL (ref 8.9–10.3)
Chloride: 108 mmol/L (ref 101–111)
GFR, EST AFRICAN AMERICAN: 55 mL/min — AB (ref >60)
GFR, EST NON AFRICAN AMERICAN: 48 mL/min — AB (ref >60)
Glucose, Bld: 98 mg/dL (ref 65–99)
Potassium: 4.6 mmol/L (ref 3.5–5.1)
Sodium: 135 mmol/L (ref 135–145)
TOTAL PROTEIN: 7.1 g/dL (ref 6.5–8.1)

## 2016-03-26 LAB — CBC WITH DIFFERENTIAL/PLATELET
BASOS ABS: 0.1 10*3/uL (ref 0–0.1)
BASOS PCT: 2 %
EOS ABS: 0.2 10*3/uL (ref 0–0.7)
EOS PCT: 5 %
HEMATOCRIT: 30.7 % — AB (ref 40.0–52.0)
Hemoglobin: 11 g/dL — ABNORMAL LOW (ref 13.0–18.0)
Lymphocytes Relative: 35 %
Lymphs Abs: 1.1 10*3/uL (ref 1.0–3.6)
MCH: 30.7 pg (ref 26.0–34.0)
MCHC: 35.7 g/dL (ref 32.0–36.0)
MCV: 85.9 fL (ref 80.0–100.0)
MONO ABS: 0.2 10*3/uL (ref 0.2–1.0)
Monocytes Relative: 8 %
NEUTROS ABS: 1.6 10*3/uL (ref 1.4–6.5)
Neutrophils Relative %: 50 %
PLATELETS: 184 10*3/uL (ref 150–440)
RBC: 3.58 MIL/uL — ABNORMAL LOW (ref 4.40–5.90)
RDW: 13.6 % (ref 11.5–14.5)
WBC: 3.2 10*3/uL — ABNORMAL LOW (ref 3.8–10.6)

## 2016-03-26 NOTE — Progress Notes (Signed)
Patient here today for follow up regarding neutropenia. Patient states he developed headache and fever on 3/11, given antibiotics by on call provider. Patient denies any fevers since this time.

## 2016-03-26 NOTE — Progress Notes (Signed)
[place]  [phone] Fax:([phone]) [phone]     ID: Brandon Wilkerson OB: 1941-05-18  MR#: [id number]  CSN#:[id number]  Patient Care Team: Jodi Marble, MD as PCP - General (Internal Medicine)  CHIEF COMPLAINT/DIAGNOSIS:  Leukopenia/Neutropenia, Mild Anemia - Mild, asymptomatic, of unclear etiology. Workup so far unremarkable as detailed below.  Bone marrow biopsy 05/10/13 - no diagnostic morphologic evidence of B-cell lymphoproliferative disorder or other hematopoietic neoplasia identified.  Normocellular to slightly hypercellular marrow for age 80-50% with maturing trilineage hematopoiesis, storage iron present, slight patchy increase in reticulin  Flow study negative for any B-cell monoclonality, nonspecific myeloid and normocytic findings with no increase in blasts,relatively increased eosinophils 7% is nonspecific,relatively increased normal B-cell precursors (hematogones)and.  Normal male karyotype, 46XY.  Labs done on 11/02/12 - Hb 13.3, platelets 172K, retic 0.036, WBC 3400 with 38% neutrophils, 35% lymphocytes, 10% variant lymphocytes, 12% monocytes. Iron study, B12, folate, ANA, serum immunoelectrophoresis (SIEP), PT, PTT, LDH, haptoglobin, Coombs test, HBsAg, HCV antibody, and HIV antibody all unremarkable.   05/03/13 - Peripheral blood Flow Cytometry. A very small (0.3% of leukocytes) B cell clone is detected. Previous phenotyping results showed similar findings. The clone is very small and is not diagnostic of any specific type of lymphoma. If the patient has a previous history of lymphoma, the finding is consistent with persistent involvement by lymphoma. 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  07/23/15 -  Bone Marrow Biopsy Report  -  nonspecific marrow findings with no diagnostic morphologic or immunophenotypic evidence of hematopoietic neoplasia. Mildly hypercellular marrow for age (40-50%) with myeloid hypoplasia,  increased eosinophils, erythroid hyperplasia with mild nonspecific dyserythropoiesis and mild megakaryocytic atypia. Diffuse mild to focally moderate increase in reticulin. Storage iron present. Flow cytometry reports relatively decreased neutrophilic cells with no immunophenotypic abnormalities or increase in blasts, relatively increased eosinophils (17%) nonspecific. Cytogenetics normal 46XY. SNP microarray result is Normal Male. The treatment with short course of steroids was attempted in November 2016, however there was no response. Treatment with G-CSF was initiated in early December 2016  HISTORY OF PRESENT ILLNESS:    pleasant 75 year old male patient with above history of neutropenia of unclear etiology is here for follow-up.  Patient had episode of  flulike syndrome approximately 4 weeks ago that resolved.  He took 1 dose of Levaquin.   Patient has been getting  Neupogen shots 2  Every 3 weeks.  His last injection was approximately 2 weeks ago.  He denies fevers, chills, nausea, vomiting, diarrhea, night sweats, cough.  REVIEW  OF SYSTEMS:   ROS As in HPI above. In addition, no new headaches or focal weakness.  No sore throat, cough, shortness of breath, sputum, hemoptysis or chest pain. No abdominal pain, constipation, diarrhea, dysuria or hematuria.   PAST MEDICAL HISTORY: Reviewed.         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 As above.  FAMILY HISTORY: Reviewed. Remarkable for diabetes, heart disease, hypertension, breast cancer.   SOCIAL HISTORY: Reviewed. Nonsmoker.  Denies alcohol or recreational drug usage.    Allergies  Allergen Reactions  . Pollen Extract Other (See Comments)    Runny nose    Current Outpatient Prescriptions  Medication Sig Dispense Refill    . amLODipine (NORVASC) 10 MG tablet Take 10 mg by mouth  daily.    . aspirin 81 MG tablet Take 81 mg by mouth daily.    . cloNIDine (CATAPRES) 0.3 MG tablet Take 0.3 mg by mouth 2 (two) times daily.    . clopidogrel (PLAVIX) 75 MG tablet Take 75 mg by mouth daily.    . fluticasone (FLONASE) 50 MCG/ACT nasal spray Place 1 spray into both nostrils daily.    Marland Kitchen gabapentin (NEURONTIN) 600 MG tablet Take 600 mg by mouth 3 (three) times daily.    Marland Kitchen glipiZIDE (GLUCOTROL) 5 MG tablet Take 2.5 mg by mouth 2 (two) times daily.    . isosorbide mononitrate (IMDUR) 60 MG 24 hr tablet Take 60 mg by mouth daily.    . metFORMIN (GLUCOPHAGE) 1000 MG tablet Take 1,000 mg by mouth 2 (two) times daily with a meal.    . nitroGLYCERIN (NITROSTAT) 0.4 MG SL tablet Place 0.4 mg under the tongue every 5 (five) minutes as needed for chest pain.    . ONE TOUCH ULTRA TEST test strip use as directed;  twice a day OR MORE if needed  1  . rosuvastatin (CRESTOR) 40 MG tablet   0  . valsartan-hydrochlorothiazide (DIOVAN-HCT) 320-25 MG tablet Take 1 tablet by mouth daily.  0   No current facility-administered medications for this visit.    PHYSICAL EXAM: There were no vitals filed for this visit.   There is no weight on file to calculate BMI.     There were no vitals taken for this visit.  General Appearance:    Alert, cooperative, no distress, appears stated age  Head:    Normocephalic, without obvious abnormality, atraumatic  Eyes:    PERRL, conjunctiva/corneas clear, EOM's intact, fundi    benign, both eyes       Ears:    Normal TM's and external ear canals, both ears  Nose:   Nares normal, septum midline, mucosa normal, no drainage   or sinus tenderness  Throat:   Lips, mucosa, and tongue normal; teeth and gums normal  Neck:   Supple, symmetrical, trachea midline, no adenopathy;       thyroid:  No enlargement/tenderness/nodules; carotid   bruit on L  Back:     Symmetric, no curvature, ROM normal, no CVA  tenderness  Lungs:     Clear to auscultation bilaterally, respirations unlabored  Chest wall:    No tenderness or deformity  Heart:    Regular rate and rhythm, S1 and S2 normal, systolic ejection murmur 3 out of 6 on aorta   Abdomen:     Soft, non-tender, bowel sounds active all four quadrants,    no masses, no organomegaly  Extremities:   Extremities normal, atraumatic, no cyanosis or edema  Pulses:   2+ and symmetric all extremities  Skin:   Skin color, texture, turgor normal, no rashes or lesions  Lymph nodes:   Cervical, supraclavicular, and axillary nodes normal  Neurologic:   CNII-XII intact. Normal strength, sensation and reflexes      throughout     LAB RESULTS: Recent Results (from the past 2160 hour(s))  CBC with Differential     Status: Abnormal   Collection Time: 01/01/16  9:04 AM  Result Value Ref Range   WBC 4.5 3.8 - 10.6 K/uL   RBC 3.79 (L) 4.40 - 5.90 MIL/uL   Hemoglobin 11.8 (L) 13.0 - 18.0 g/dL   HCT 33.5 (L) 40.0 - 52.0 %   MCV 88.4 80.0 - 100.0 fL   MCH 31.2 26.0 - 34.0  pg   MCHC 35.3 32.0 - 36.0 g/dL   RDW 14.2 11.5 - 14.5 %   Platelets 183 150 - 440 K/uL   Neutrophils Relative % 61 %   Neutro Abs 2.7 1.4 - 6.5 K/uL   Lymphocytes Relative 24 %   Lymphs Abs 1.1 1.0 - 3.6 K/uL   Monocytes Relative 5 %   Monocytes Absolute 0.2 0.2 - 1.0 K/uL   Eosinophils Relative 9 %   Eosinophils Absolute 0.4 0 - 0.7 K/uL   Basophils Relative 1 %   Basophils Absolute 0.1 0 - 0.1 K/uL  CBC with Differential/Platelet     Status: Abnormal   Collection Time: 01/21/16  2:45 PM  Result Value Ref Range   WBC 2.8 (L) 3.8 - 10.6 K/uL   RBC 3.87 (L) 4.40 - 5.90 MIL/uL   Hemoglobin 11.8 (L) 13.0 - 18.0 g/dL   HCT 34.5 (L) 40.0 - 52.0 %   MCV 89.2 80.0 - 100.0 fL   MCH 30.6 26.0 - 34.0 pg   MCHC 34.3 32.0 - 36.0 g/dL   RDW 13.8 11.5 - 14.5 %   Platelets 157 150 - 440 K/uL   Neutrophils Relative % 23 %   Neutro Abs 0.6 (L) 1.4 - 6.5 K/uL   Lymphocytes Relative 55 %    Lymphs Abs 1.6 1.0 - 3.6 K/uL   Monocytes Relative 11 %   Monocytes Absolute 0.3 0.2 - 1.0 K/uL   Eosinophils Relative 10 %   Eosinophils Absolute 0.3 0 - 0.7 K/uL   Basophils Relative 1 %   Basophils Absolute 0.0 0 - 0.1 K/uL  CBC with Differential     Status: Abnormal   Collection Time: 02/04/16  2:15 PM  Result Value Ref Range   WBC 2.9 (L) 3.8 - 10.6 K/uL   RBC 3.57 (L) 4.40 - 5.90 MIL/uL   Hemoglobin 11.0 (L) 13.0 - 18.0 g/dL   HCT 31.2 (L) 40.0 - 52.0 %   MCV 87.5 80.0 - 100.0 fL   MCH 30.7 26.0 - 34.0 pg   MCHC 35.1 32.0 - 36.0 g/dL   RDW 13.8 11.5 - 14.5 %   Platelets 169 150 - 440 K/uL   Neutrophils Relative % 51 %   Neutro Abs 1.5 1.4 - 6.5 K/uL   Lymphocytes Relative 34 %   Lymphs Abs 1.0 1.0 - 3.6 K/uL   Monocytes Relative 7 %   Monocytes Absolute 0.2 0.2 - 1.0 K/uL   Eosinophils Relative 6 %   Eosinophils Absolute 0.2 0 - 0.7 K/uL   Basophils Relative 2 %   Basophils Absolute 0.1 0 - 0.1 K/uL  CBC with Differential     Status: Abnormal   Collection Time: 02/11/16  3:25 PM  Result Value Ref Range   WBC 2.1 (L) 3.8 - 10.6 K/uL   RBC 3.44 (L) 4.40 - 5.90 MIL/uL   Hemoglobin 10.6 (L) 13.0 - 18.0 g/dL   HCT 30.6 (L) 40.0 - 52.0 %   MCV 88.9 80.0 - 100.0 fL   MCH 30.9 26.0 - 34.0 pg   MCHC 34.7 32.0 - 36.0 g/dL   RDW 13.4 11.5 - 14.5 %   Platelets 187 150 - 440 K/uL   Neutrophils Relative % 10 %   Neutro Abs 0.2 (L) 1.4 - 6.5 K/uL    Comment: RESULT REPEATED AND VERIFIED CRITICAL RESULT CALLED TO, READ BACK BY AND VERIFIED WITH: [facility] [place] AT 1539 02/11/2016 KMR    Lymphocytes Relative 55 %  Lymphs Abs 1.1 1.0 - 3.6 K/uL   Monocytes Relative 21 %   Monocytes Absolute 0.4 0.2 - 1.0 K/uL   Eosinophils Relative 11 %   Eosinophils Absolute 0.2 0 - 0.7 K/uL   Basophils Relative 3 %   Basophils Absolute 0.1 0 - 0.1 K/uL  CBC with Differential     Status: Abnormal   Collection Time: 02/25/16  2:14 PM  Result Value Ref Range   WBC 4.0 3.8 - 10.6 K/uL   RBC  3.53 (L) 4.40 - 5.90 MIL/uL   Hemoglobin 10.8 (L) 13.0 - 18.0 g/dL   HCT 30.9 (L) 40.0 - 52.0 %   MCV 87.7 80.0 - 100.0 fL   MCH 30.7 26.0 - 34.0 pg   MCHC 35.0 32.0 - 36.0 g/dL   RDW 13.5 11.5 - 14.5 %   Platelets 162 150 - 440 K/uL   Neutrophils Relative % 63 %   Neutro Abs 2.5 1.4 - 6.5 K/uL   Lymphocytes Relative 24 %   Lymphs Abs 1.0 1.0 - 3.6 K/uL   Monocytes Relative 6 %   Monocytes Absolute 0.2 0.2 - 1.0 K/uL   Eosinophils Relative 5 %   Eosinophils Absolute 0.2 0 - 0.7 K/uL   Basophils Relative 2 %   Basophils Absolute 0.1 0 - 0.1 K/uL  CBC with Differential     Status: Abnormal   Collection Time: 03/12/16  1:28 PM  Result Value Ref Range   WBC 2.0 (L) 3.8 - 10.6 K/uL   RBC 3.77 (L) 4.40 - 5.90 MIL/uL   Hemoglobin 11.6 (L) 13.0 - 18.0 g/dL   HCT 32.7 (L) 40.0 - 52.0 %   MCV 86.8 80.0 - 100.0 fL   MCH 30.7 26.0 - 34.0 pg   MCHC 35.4 32.0 - 36.0 g/dL   RDW 13.4 11.5 - 14.5 %   Platelets 165 150 - 440 K/uL   Neutrophils Relative % 13 %   Neutro Abs 0.3 (L) 1.7 - 7.7 K/uL    Comment: RESULT REPEATED AND VERIFIED CRITICAL RESULT CALLED TO, READ BACK BY AND VERIFIED WITH: ALLYSON PRICE AT 1412 03/12/2016 KMR    Lymphocytes Relative 56 %   Lymphs Abs 1.1 1.0 - 3.6 K/uL   Monocytes Relative 19 %   Monocytes Absolute 0.4 0.2 - 1.0 K/uL   Eosinophils Relative 10 %   Eosinophils Absolute 0.2 0 - 0.7 K/uL   Basophils Relative 2 %   Basophils Absolute 0.0 0 - 0.1 K/uL     STUDIES: No results found. 07/23/15 -  Bone Marrow Biopsy Report  -  nonspecific marrow findings with no diagnostic morphologic or immunophenotypic evidence of hematopoietic neoplasia. Mildly hypercellular marrow for age (40-50%) with myeloid hypoplasia, increased eosinophils, erythroid hyperplasia with mild nonspecific dyserythropoiesis and mild megakaryocytic atypia. Diffuse mild to focally moderate increase in reticulin. Storage iron present. Flow cytometry reports relatively decreased neutrophilic  cells with no immunophenotypic abnormalities or increase in blasts, relatively increased eosinophils (17%) nonspecific. Cytogenetics normal 46XY. SNP microarray result is Normal Male.    ASSESSMENT / PLAN:   1. Leukopenia/Neutropenia  of unclear origin-as previously stated, extensive workup including multiple bone marrow biopsies failed to reveal clear etiology of persistent neutropenia.  Currently on granix  2  Every 3 weeks.  #  Today the white count is 3.2  Absolute neutrophil count of  1.6; hemoglobin 11  Platelets 184.  The goal is to keep the absolute neutrophil count greater than thousand.  #  Patient  will follow-up with [place] in approximately 4-6 months.  The interim he will continue to get labs  CBC every 3 weeks/ and  Granix q day x 2 every 3 weeks.   Cammie Sickle, MD   03/26/2016 1:59 PM

## 2016-03-27 ENCOUNTER — Inpatient Hospital Stay: Payer: Medicare HMO

## 2016-04-02 ENCOUNTER — Inpatient Hospital Stay: Payer: Medicare HMO | Admitting: *Deleted

## 2016-04-02 ENCOUNTER — Telehealth: Payer: Self-pay | Admitting: *Deleted

## 2016-04-02 ENCOUNTER — Inpatient Hospital Stay: Payer: Medicare HMO

## 2016-04-02 DIAGNOSIS — D709 Neutropenia, unspecified: Secondary | ICD-10-CM

## 2016-04-02 DIAGNOSIS — D708 Other neutropenia: Secondary | ICD-10-CM

## 2016-04-02 LAB — CBC WITH DIFFERENTIAL/PLATELET
Basophils Absolute: 0.1 10*3/uL (ref 0–0.1)
Basophils Relative: 3 %
EOS ABS: 0.2 10*3/uL (ref 0–0.7)
EOS PCT: 9 %
HCT: 30.6 % — ABNORMAL LOW (ref 40.0–52.0)
Hemoglobin: 10.8 g/dL — ABNORMAL LOW (ref 13.0–18.0)
LYMPHS ABS: 1.1 10*3/uL (ref 1.0–3.6)
Lymphocytes Relative: 58 %
MCH: 30.8 pg (ref 26.0–34.0)
MCHC: 35.5 g/dL (ref 32.0–36.0)
MCV: 86.8 fL (ref 80.0–100.0)
MONOS PCT: 26 %
Monocytes Absolute: 0.5 10*3/uL (ref 0.2–1.0)
Neutro Abs: 0.1 10*3/uL — ABNORMAL LOW (ref 1.7–7.7)
Neutrophils Relative %: 4 %
PLATELETS: 221 10*3/uL (ref 150–440)
RBC: 3.52 MIL/uL — ABNORMAL LOW (ref 4.40–5.90)
RDW: 13.4 % (ref 11.5–14.5)
WBC: 1.9 10*3/uL — ABNORMAL LOW (ref 3.8–10.6)

## 2016-04-02 MED ORDER — TBO-FILGRASTIM 480 MCG/0.8ML ~~LOC~~ SOSY
480.0000 ug | PREFILLED_SYRINGE | Freq: Once | SUBCUTANEOUS | Status: AC
  Administered 2016-04-02: 480 ug via SUBCUTANEOUS
  Filled 2016-04-02: qty 0.8

## 2016-04-02 NOTE — Telephone Encounter (Signed)
Critical value for ANC called to cancer center. anc 0.1. Read back process with lab tech and md.

## 2016-04-03 ENCOUNTER — Telehealth: Payer: Self-pay | Admitting: *Deleted

## 2016-04-03 ENCOUNTER — Inpatient Hospital Stay: Payer: Medicare HMO

## 2016-04-03 DIAGNOSIS — D708 Other neutropenia: Secondary | ICD-10-CM

## 2016-04-03 MED ORDER — TBO-FILGRASTIM 480 MCG/0.8ML ~~LOC~~ SOSY
480.0000 ug | PREFILLED_SYRINGE | Freq: Once | SUBCUTANEOUS | Status: AC
  Administered 2016-04-03: 480 ug via SUBCUTANEOUS
  Filled 2016-04-03: qty 0.8

## 2016-04-03 NOTE — Telephone Encounter (Signed)
Patient states for past 2-3 days since neupogen shot when he exerts himself he feels tightness across his chest. Ex: when working out in the yard or picking up and playing with grandkids.  Wants to know if this is normal?   Called patient and left message that MD states this is normal.

## 2016-04-22 ENCOUNTER — Inpatient Hospital Stay: Payer: Medicare HMO | Attending: Internal Medicine

## 2016-04-22 ENCOUNTER — Telehealth: Payer: Self-pay | Admitting: *Deleted

## 2016-04-22 ENCOUNTER — Inpatient Hospital Stay: Payer: Medicare HMO

## 2016-04-22 DIAGNOSIS — D649 Anemia, unspecified: Secondary | ICD-10-CM | POA: Diagnosis not present

## 2016-04-22 DIAGNOSIS — D709 Neutropenia, unspecified: Secondary | ICD-10-CM

## 2016-04-22 DIAGNOSIS — D708 Other neutropenia: Secondary | ICD-10-CM | POA: Diagnosis present

## 2016-04-22 LAB — CBC WITH DIFFERENTIAL/PLATELET
BASOS ABS: 0.1 10*3/uL (ref 0–0.1)
Basophils Relative: 4 %
Eosinophils Absolute: 0.2 10*3/uL (ref 0–0.7)
Eosinophils Relative: 11 %
HEMATOCRIT: 31.1 % — AB (ref 40.0–52.0)
HEMOGLOBIN: 10.9 g/dL — AB (ref 13.0–18.0)
LYMPHS ABS: 1.2 10*3/uL (ref 1.0–3.6)
LYMPHS PCT: 57 %
MCH: 30.2 pg (ref 26.0–34.0)
MCHC: 35 g/dL (ref 32.0–36.0)
MCV: 86.4 fL (ref 80.0–100.0)
Monocytes Absolute: 0.5 10*3/uL (ref 0.2–1.0)
Monocytes Relative: 23 %
NEUTROS ABS: 0.1 10*3/uL — AB (ref 1.7–7.7)
Neutrophils Relative %: 5 %
PLATELETS: 189 10*3/uL (ref 150–440)
RBC: 3.61 MIL/uL — AB (ref 4.40–5.90)
RDW: 13.7 % (ref 11.5–14.5)
WBC: 2 10*3/uL — ABNORMAL LOW (ref 3.8–10.6)

## 2016-04-22 MED ORDER — TBO-FILGRASTIM 480 MCG/0.8ML ~~LOC~~ SOSY
480.0000 ug | PREFILLED_SYRINGE | Freq: Once | SUBCUTANEOUS | Status: AC
  Administered 2016-04-22: 480 ug via SUBCUTANEOUS
  Filled 2016-04-22: qty 0.8

## 2016-04-22 NOTE — Telephone Encounter (Signed)
[facility].  [facility]  Dr. Donneta RombergBrahmanday informed of critical value.. Read back process performed with md. 1059 am. Pt to receive granix injection as scheduled.

## 2016-04-23 ENCOUNTER — Ambulatory Visit: Payer: Medicare HMO

## 2016-04-23 ENCOUNTER — Other Ambulatory Visit: Payer: Medicare HMO

## 2016-04-23 ENCOUNTER — Inpatient Hospital Stay: Payer: Medicare HMO

## 2016-04-23 VITALS — BP 134/64 | HR 67 | Temp 97.6°F

## 2016-04-23 DIAGNOSIS — D708 Other neutropenia: Secondary | ICD-10-CM

## 2016-04-23 MED ORDER — TBO-FILGRASTIM 480 MCG/0.8ML ~~LOC~~ SOSY
480.0000 ug | PREFILLED_SYRINGE | Freq: Once | SUBCUTANEOUS | Status: AC
  Administered 2016-04-23: 480 ug via SUBCUTANEOUS
  Filled 2016-04-23: qty 0.8

## 2016-04-24 ENCOUNTER — Ambulatory Visit: Payer: Medicare HMO

## 2016-04-25 ENCOUNTER — Emergency Department: Payer: Medicare HMO

## 2016-04-25 ENCOUNTER — Other Ambulatory Visit: Payer: Self-pay

## 2016-04-25 ENCOUNTER — Inpatient Hospital Stay
Admission: EM | Admit: 2016-04-25 | Discharge: 2016-04-28 | DRG: 247 | Disposition: A | Discharge status: Home / Self Care | Payer: Medicare HMO | Attending: Internal Medicine | Admitting: Internal Medicine

## 2016-04-25 ENCOUNTER — Encounter: Payer: Self-pay | Admitting: Emergency Medicine

## 2016-04-25 DIAGNOSIS — Z87891 Personal history of nicotine dependence: Secondary | ICD-10-CM

## 2016-04-25 DIAGNOSIS — Z7982 Long term (current) use of aspirin: Secondary | ICD-10-CM

## 2016-04-25 DIAGNOSIS — Z7951 Long term (current) use of inhaled steroids: Secondary | ICD-10-CM

## 2016-04-25 DIAGNOSIS — E1151 Type 2 diabetes mellitus with diabetic peripheral angiopathy without gangrene: Secondary | ICD-10-CM | POA: Diagnosis present

## 2016-04-25 DIAGNOSIS — Z8249 Family history of ischemic heart disease and other diseases of the circulatory system: Secondary | ICD-10-CM

## 2016-04-25 DIAGNOSIS — I209 Angina pectoris, unspecified: Secondary | ICD-10-CM

## 2016-04-25 DIAGNOSIS — Z803 Family history of malignant neoplasm of breast: Secondary | ICD-10-CM

## 2016-04-25 DIAGNOSIS — Z833 Family history of diabetes mellitus: Secondary | ICD-10-CM

## 2016-04-25 DIAGNOSIS — G459 Transient cerebral ischemic attack, unspecified: Secondary | ICD-10-CM | POA: Diagnosis not present

## 2016-04-25 DIAGNOSIS — D709 Neutropenia, unspecified: Secondary | ICD-10-CM | POA: Diagnosis present

## 2016-04-25 DIAGNOSIS — Z7902 Long term (current) use of antithrombotics/antiplatelets: Secondary | ICD-10-CM

## 2016-04-25 DIAGNOSIS — E119 Type 2 diabetes mellitus without complications: Secondary | ICD-10-CM

## 2016-04-25 DIAGNOSIS — Z955 Presence of coronary angioplasty implant and graft: Secondary | ICD-10-CM

## 2016-04-25 DIAGNOSIS — E1122 Type 2 diabetes mellitus with diabetic chronic kidney disease: Secondary | ICD-10-CM | POA: Diagnosis present

## 2016-04-25 DIAGNOSIS — N183 Chronic kidney disease, stage 3 (moderate): Secondary | ICD-10-CM | POA: Diagnosis present

## 2016-04-25 DIAGNOSIS — R509 Fever, unspecified: Secondary | ICD-10-CM | POA: Diagnosis present

## 2016-04-25 DIAGNOSIS — I200 Unstable angina: Secondary | ICD-10-CM | POA: Diagnosis present

## 2016-04-25 DIAGNOSIS — Z7984 Long term (current) use of oral hypoglycemic drugs: Secondary | ICD-10-CM

## 2016-04-25 DIAGNOSIS — I2510 Atherosclerotic heart disease of native coronary artery without angina pectoris: Secondary | ICD-10-CM | POA: Diagnosis present

## 2016-04-25 DIAGNOSIS — I25110 Atherosclerotic heart disease of native coronary artery with unstable angina pectoris: Secondary | ICD-10-CM | POA: Diagnosis not present

## 2016-04-25 DIAGNOSIS — I129 Hypertensive chronic kidney disease with stage 1 through stage 4 chronic kidney disease, or unspecified chronic kidney disease: Secondary | ICD-10-CM | POA: Diagnosis present

## 2016-04-25 DIAGNOSIS — I350 Nonrheumatic aortic (valve) stenosis: Secondary | ICD-10-CM | POA: Diagnosis present

## 2016-04-25 DIAGNOSIS — I060 Rheumatic aortic stenosis: Secondary | ICD-10-CM | POA: Diagnosis present

## 2016-04-25 DIAGNOSIS — E785 Hyperlipidemia, unspecified: Secondary | ICD-10-CM | POA: Diagnosis present

## 2016-04-25 DIAGNOSIS — I10 Essential (primary) hypertension: Secondary | ICD-10-CM | POA: Diagnosis present

## 2016-04-25 DIAGNOSIS — M1990 Unspecified osteoarthritis, unspecified site: Secondary | ICD-10-CM | POA: Diagnosis present

## 2016-04-25 DIAGNOSIS — G4733 Obstructive sleep apnea (adult) (pediatric): Secondary | ICD-10-CM | POA: Diagnosis present

## 2016-04-25 DIAGNOSIS — N179 Acute kidney failure, unspecified: Secondary | ICD-10-CM | POA: Diagnosis present

## 2016-04-25 DIAGNOSIS — Z79899 Other long term (current) drug therapy: Secondary | ICD-10-CM

## 2016-04-25 HISTORY — DX: Decreased white blood cell count, unspecified: D72.819

## 2016-04-25 LAB — CBC
HEMATOCRIT: 29 % — AB (ref 40.0–52.0)
HEMOGLOBIN: 9.9 g/dL — AB (ref 13.0–18.0)
MCH: 30.2 pg (ref 26.0–34.0)
MCHC: 34.1 g/dL (ref 32.0–36.0)
MCV: 88.5 fL (ref 80.0–100.0)
Platelets: 154 10*3/uL (ref 150–440)
RBC: 3.28 MIL/uL — AB (ref 4.40–5.90)
RDW: 13.9 % (ref 11.5–14.5)
WBC: 2.5 10*3/uL — ABNORMAL LOW (ref 3.8–10.6)

## 2016-04-25 LAB — BASIC METABOLIC PANEL
ANION GAP: 7 (ref 5–15)
BUN: 50 mg/dL — ABNORMAL HIGH (ref 6–20)
CHLORIDE: 106 mmol/L (ref 101–111)
CO2: 21 mmol/L — AB (ref 22–32)
Calcium: 8.9 mg/dL (ref 8.9–10.3)
Creatinine, Ser: 2.69 mg/dL — ABNORMAL HIGH (ref 0.61–1.24)
GFR calc non Af Amer: 22 mL/min — ABNORMAL LOW (ref >60)
GFR, EST AFRICAN AMERICAN: 26 mL/min — AB (ref >60)
GLUCOSE: 123 mg/dL — AB (ref 65–99)
Potassium: 4.9 mmol/L (ref 3.5–5.1)
Sodium: 134 mmol/L — ABNORMAL LOW (ref 135–145)

## 2016-04-25 LAB — TROPONIN I

## 2016-04-25 MED ORDER — SODIUM CHLORIDE 0.9 % IV BOLUS (SEPSIS)
1000.0000 mL | INTRAVENOUS | Status: AC
  Administered 2016-04-26: 1000 mL via INTRAVENOUS

## 2016-04-25 NOTE — ED Provider Notes (Signed)
[facility] Emergency Department Provider Note  ____________________________________________  Time seen: Approximately 11:47 PM  I have reviewed the triage vital signs and the nursing notes.   HISTORY  Chief Complaint Chest Pain    HPI Brandon Wilkerson is a 75 y.o. male with a history of leukopenia who is followed byhematology oncology as well as who had a catheterization about 5 years ago and a cardiac catheter with stents 13 years ago.  He presents with several weeks (triage note says 2 but the patient says 6) of worsening generalized chest pressure with exertion.  It is A with mild shortness of breath.  He describes the symptoms as moderate in getting worse over the last few weeks.  Today he was having chest pain with any amount of ambulation and was weak enough he could not step his suitcase (they just got back from a trip).  He has no chest pain at rest and is currently asymptomatic.  He reports that he has had some nausea and generalized weakness.  He has been thirsty today and has been drinking plenty of fluids but has not urinated for the entire day according to him and his wife.  Nothing is making his symptoms better and exertion makes it worse.  They have been gradual in onset but are slowly worsening.  Dr. Milta Deiters is his cardiologist.   Past Medical History  Diagnosis Date  . Hypertension   . PVD (peripheral vascular disease) (HCC)   . Diabetes mellitus without complication (HCC)   . Arthritis   . Rheumatic aortic stenosis   . Sleep apnea   . Chronic leukopenia     Patient Active Problem List   Diagnosis Date Noted  . Neutropenia (HCC) 05/10/2013    Past Surgical History  Procedure Laterality Date  . Appendectomy    . Coronary angioplasty with stent placement      Current Outpatient Rx  Name  Route  Sig  Dispense  Refill  . amLODipine (NORVASC) 10 MG tablet   Oral   Take 10 mg by mouth daily.         Marland Kitchen aspirin 81 MG tablet  Oral   Take 81 mg by mouth daily.         . cloNIDine (CATAPRES) 0.3 MG tablet   Oral   Take 0.3 mg by mouth 2 (two) times daily.         . clopidogrel (PLAVIX) 75 MG tablet   Oral   Take 75 mg by mouth daily.         . fluticasone (FLONASE) 50 MCG/ACT nasal spray   Each Nare   Place 1 spray into both nostrils daily.         Marland Kitchen gabapentin (NEURONTIN) 600 MG tablet   Oral   Take 600 mg by mouth 3 (three) times daily.         Marland Kitchen glipiZIDE (GLUCOTROL) 5 MG tablet   Oral   Take 2.5 mg by mouth 2 (two) times daily.         . isosorbide mononitrate (IMDUR) 60 MG 24 hr tablet   Oral   Take 60 mg by mouth daily.         . metFORMIN (GLUCOPHAGE) 1000 MG tablet   Oral   Take 1,000 mg by mouth 2 (two) times daily with a meal.         . nitroGLYCERIN (NITROSTAT) 0.4 MG SL tablet   Sublingual   Place 0.4 mg  under the tongue every 5 (five) minutes as needed for chest pain.         . ONE TOUCH ULTRA TEST test strip      use as directed;  twice a day OR MORE if needed      1     Dispense as written.   . rosuvastatin (CRESTOR) 40 MG tablet            0   . valsartan-hydrochlorothiazide (DIOVAN-HCT) 320-25 MG tablet   Oral   Take 1 tablet by mouth daily.      0     Allergies Pollen extract  History reviewed. No pertinent family history.  Social History Social History  Substance Use Topics  . Smoking status: Former Smoker    Types: Cigarettes    Quit date: 11/22/1979  . Smokeless tobacco: Never Used  . Alcohol Use: No    Review of Systems Constitutional: No fever/chills Eyes: No visual changes. ENT: No sore throat. Cardiovascular: +chest pain. Respiratory: +shortness of breath (Exertional) Gastrointestinal: No abdominal pain.  nausea, no vomiting.  No diarrhea.  No constipation. Genitourinary: Negative for dysuria. Musculoskeletal: Negative for back pain. Skin: Negative for rash. Neurological: Negative for headaches, focal weakness or  numbness, but the patient complains of generalized weakness and fatigue  10-point ROS otherwise negative.  ____________________________________________   PHYSICAL EXAM:  VITAL SIGNS: ED Triage Vitals  Enc Vitals Group     BP 04/25/16 2035 111/41 mmHg     Pulse Rate 04/25/16 2035 63     Resp 04/25/16 2035 18     Temp 04/25/16 2035 99.5 F (37.5 C)     Temp Source 04/25/16 2035 Oral     SpO2 04/25/16 2035 95 %     Weight 04/25/16 2035 202 lb (91.627 kg)     Height 04/25/16 2035 6' 1.5" (1.867 m)     Head Cir --      Peak Flow --      Pain Score 04/25/16 2036 0     Pain Loc --      Pain Edu? --      Excl. in GC? --     Constitutional: Alert and oriented. Well appearing and in no acute distress. Eyes: Conjunctivae are normal. PERRL. EOMI. Head: Atraumatic. Nose: No congestion/rhinnorhea. Mouth/Throat: Mucous membranes are Dry.  Oropharynx non-erythematous. Neck: No stridor.  No meningeal signs.   Cardiovascular: Normal rate, regular rhythm. Good peripheral circulation. Grossly normal heart sounds.   Respiratory: Normal respiratory effort.  No retractions. Lungs CTAB. Gastrointestinal: Soft and nontender. No distention.  Musculoskeletal: No lower extremity tenderness nor edema. No gross deformities of extremities. Neurologic:  Normal speech and language. No gross focal neurologic deficits are appreciated.  Skin:  Skin is warm, dry and intact. No rash noted. Psychiatric: Mood and affect are normal. Speech and behavior are normal.  ____________________________________________   LABS (all labs ordered are listed, but only abnormal results are displayed)  Labs Reviewed  BASIC METABOLIC PANEL - Abnormal; Notable for the following:    Sodium 134 (*)    CO2 21 (*)    Glucose, Bld 123 (*)    BUN 50 (*)    Creatinine, Ser 2.69 (*)    GFR calc non Af Amer 22 (*)    GFR calc Af Amer 26 (*)    All other components within normal limits  CBC - Abnormal; Notable for the  following:    WBC 2.5 (*)    RBC 3.28 (*)  Hemoglobin 9.9 (*)    HCT 29.0 (*)    All other components within normal limits  TROPONIN I   ____________________________________________  EKG  ED ECG REPORT I, Chea Malan, the attending physician, personally viewed and interpreted this ECG.  Date: 04/25/2016 EKG Time: 20:25 Rate: 60 Rhythm: normal sinus rhythm QRS Axis: normal Intervals: normal ST/T Wave abnormalities: Inverted T-wave in lead 3, otherwise unremarkable Conduction Disturbances: none Narrative Interpretation: unremarkable  ____________________________________________  RADIOLOGY   Dg Chest 2 View  04/25/2016  CLINICAL DATA:  Chest pain EXAM: CHEST  2 VIEW COMPARISON:  None. FINDINGS: Normal heart size. Normal mediastinal contour. No pneumothorax. No pleural effusion. Lungs appear clear, with no acute consolidative airspace disease and no pulmonary edema. IMPRESSION: No active cardiopulmonary disease. Electronically Signed   By: Delbert Phenix M.D.   On: 04/25/2016 21:07    ____________________________________________   PROCEDURES  Procedure(s) performed: None  Critical Care performed: No ____________________________________________   INITIAL IMPRESSION / ASSESSMENT AND PLAN / ED COURSE  Pertinent labs & imaging results that were available during my care of the patient were reviewed by me and considered in my medical decision making (see chart for details).  Acute kidney injury, likely prerenal given the history as well as the elevated BUN.  Creatinine is significantly elevated over his prior baseline of 1.4.  Additionally the patient's chest pain is likely ischemic and technically represents unstable angina, but since he is chest pain-free at this time and the symptoms have been gradual in onset, I will give him a full dose aspirin and admitted him for his acute kidney injury/renal failure and have them consult cardiology to determine additional diagnostic  workup.   ____________________________________________  FINAL CLINICAL IMPRESSION(S) / ED DIAGNOSES  Final diagnoses:  Acute renal failure, unspecified acute renal failure type (HCC)  Ischemic chest pain (HCC)     MEDICATIONS GIVEN DURING THIS VISIT:  Medications  sodium chloride 0.9 % bolus 1,000 mL (not administered)  aspirin chewable tablet 324 mg (not administered)     NEW OUTPATIENT MEDICATIONS STARTED DURING THIS VISIT:  New Prescriptions   No medications on file      Note:  This document was prepared using Dragon voice recognition software and may include unintentional dictation errors.   Loleta Rose, MD 04/26/16 [phone]

## 2016-04-25 NOTE — ED Notes (Addendum)
Pt says for more that 2 weeks ago, any time he exerts himself physically he gets tightness across his chest and feels short of breath; neither symptom at rest; pt says also for the last week he's noticed difficulty talking, getting out words; occasionally he says he "drags" his left foot; denies difficulty swallowing or grasping; pt says he and his wife were out on an excursion today and he felt bad enough to return to the bus; both got concerned so came in for evaluation

## 2016-04-26 ENCOUNTER — Inpatient Hospital Stay: Payer: Medicare HMO

## 2016-04-26 ENCOUNTER — Encounter: Payer: Self-pay | Admitting: Emergency Medicine

## 2016-04-26 ENCOUNTER — Inpatient Hospital Stay
Admit: 2016-04-26 | Discharge: 2016-04-26 | Disposition: A | Discharge status: Home / Self Care | Payer: Medicare HMO | Attending: Internal Medicine | Admitting: Internal Medicine

## 2016-04-26 DIAGNOSIS — Z7902 Long term (current) use of antithrombotics/antiplatelets: Secondary | ICD-10-CM | POA: Diagnosis not present

## 2016-04-26 DIAGNOSIS — E119 Type 2 diabetes mellitus without complications: Secondary | ICD-10-CM

## 2016-04-26 DIAGNOSIS — Z7982 Long term (current) use of aspirin: Secondary | ICD-10-CM | POA: Diagnosis not present

## 2016-04-26 DIAGNOSIS — N183 Chronic kidney disease, stage 3 (moderate): Secondary | ICD-10-CM | POA: Diagnosis present

## 2016-04-26 DIAGNOSIS — E1151 Type 2 diabetes mellitus with diabetic peripheral angiopathy without gangrene: Secondary | ICD-10-CM | POA: Diagnosis present

## 2016-04-26 DIAGNOSIS — I10 Essential (primary) hypertension: Secondary | ICD-10-CM | POA: Diagnosis present

## 2016-04-26 DIAGNOSIS — N179 Acute kidney failure, unspecified: Secondary | ICD-10-CM | POA: Diagnosis present

## 2016-04-26 DIAGNOSIS — R509 Fever, unspecified: Secondary | ICD-10-CM | POA: Diagnosis present

## 2016-04-26 DIAGNOSIS — G459 Transient cerebral ischemic attack, unspecified: Secondary | ICD-10-CM | POA: Diagnosis present

## 2016-04-26 DIAGNOSIS — Z7984 Long term (current) use of oral hypoglycemic drugs: Secondary | ICD-10-CM | POA: Diagnosis not present

## 2016-04-26 DIAGNOSIS — Z79899 Other long term (current) drug therapy: Secondary | ICD-10-CM | POA: Diagnosis not present

## 2016-04-26 DIAGNOSIS — Z8249 Family history of ischemic heart disease and other diseases of the circulatory system: Secondary | ICD-10-CM | POA: Diagnosis not present

## 2016-04-26 DIAGNOSIS — I2510 Atherosclerotic heart disease of native coronary artery without angina pectoris: Secondary | ICD-10-CM | POA: Diagnosis present

## 2016-04-26 DIAGNOSIS — I060 Rheumatic aortic stenosis: Secondary | ICD-10-CM | POA: Diagnosis present

## 2016-04-26 DIAGNOSIS — Z87891 Personal history of nicotine dependence: Secondary | ICD-10-CM | POA: Diagnosis not present

## 2016-04-26 DIAGNOSIS — Z833 Family history of diabetes mellitus: Secondary | ICD-10-CM | POA: Diagnosis not present

## 2016-04-26 DIAGNOSIS — I350 Nonrheumatic aortic (valve) stenosis: Secondary | ICD-10-CM | POA: Diagnosis present

## 2016-04-26 DIAGNOSIS — I200 Unstable angina: Secondary | ICD-10-CM | POA: Diagnosis present

## 2016-04-26 DIAGNOSIS — I25110 Atherosclerotic heart disease of native coronary artery with unstable angina pectoris: Secondary | ICD-10-CM | POA: Diagnosis present

## 2016-04-26 DIAGNOSIS — D709 Neutropenia, unspecified: Secondary | ICD-10-CM | POA: Diagnosis present

## 2016-04-26 DIAGNOSIS — Z803 Family history of malignant neoplasm of breast: Secondary | ICD-10-CM | POA: Diagnosis not present

## 2016-04-26 DIAGNOSIS — I129 Hypertensive chronic kidney disease with stage 1 through stage 4 chronic kidney disease, or unspecified chronic kidney disease: Secondary | ICD-10-CM | POA: Diagnosis present

## 2016-04-26 DIAGNOSIS — M1990 Unspecified osteoarthritis, unspecified site: Secondary | ICD-10-CM | POA: Diagnosis present

## 2016-04-26 DIAGNOSIS — E1122 Type 2 diabetes mellitus with diabetic chronic kidney disease: Secondary | ICD-10-CM | POA: Diagnosis present

## 2016-04-26 DIAGNOSIS — Z955 Presence of coronary angioplasty implant and graft: Secondary | ICD-10-CM | POA: Diagnosis not present

## 2016-04-26 DIAGNOSIS — E785 Hyperlipidemia, unspecified: Secondary | ICD-10-CM | POA: Diagnosis present

## 2016-04-26 DIAGNOSIS — G4733 Obstructive sleep apnea (adult) (pediatric): Secondary | ICD-10-CM | POA: Diagnosis present

## 2016-04-26 DIAGNOSIS — Z7951 Long term (current) use of inhaled steroids: Secondary | ICD-10-CM | POA: Diagnosis not present

## 2016-04-26 LAB — BASIC METABOLIC PANEL
ANION GAP: 7 (ref 5–15)
BUN: 49 mg/dL — AB (ref 6–20)
CALCIUM: 8.6 mg/dL — AB (ref 8.9–10.3)
CO2: 19 mmol/L — AB (ref 22–32)
CREATININE: 2.48 mg/dL — AB (ref 0.61–1.24)
Chloride: 109 mmol/L (ref 101–111)
GFR calc Af Amer: 28 mL/min — ABNORMAL LOW (ref >60)
GFR, EST NON AFRICAN AMERICAN: 24 mL/min — AB (ref >60)
GLUCOSE: 106 mg/dL — AB (ref 65–99)
Potassium: 4.9 mmol/L (ref 3.5–5.1)
Sodium: 135 mmol/L (ref 135–145)

## 2016-04-26 LAB — ECHOCARDIOGRAM COMPLETE
HEIGHTINCHES: 73.5 in
WEIGHTICAEL: 3361.6 [oz_av]

## 2016-04-26 LAB — URINALYSIS COMPLETE WITH MICROSCOPIC (ARMC ONLY)
BILIRUBIN URINE: NEGATIVE
Bacteria, UA: NONE SEEN
Glucose, UA: NEGATIVE mg/dL
Hgb urine dipstick: NEGATIVE
KETONES UR: NEGATIVE mg/dL
Leukocytes, UA: NEGATIVE
NITRITE: NEGATIVE
PH: 5 (ref 5.0–8.0)
PROTEIN: NEGATIVE mg/dL
RBC / HPF: NONE SEEN RBC/hpf (ref 0–5)
SPECIFIC GRAVITY, URINE: 1.006 (ref 1.005–1.030)
Squamous Epithelial / LPF: NONE SEEN

## 2016-04-26 LAB — TROPONIN I: TROPONIN I: 0.06 ng/mL — AB (ref <0.031)

## 2016-04-26 LAB — GLUCOSE, CAPILLARY
GLUCOSE-CAPILLARY: 110 mg/dL — AB (ref 65–99)
GLUCOSE-CAPILLARY: 143 mg/dL — AB (ref 65–99)
Glucose-Capillary: 207 mg/dL — ABNORMAL HIGH (ref 65–99)
Glucose-Capillary: 177 mg/dL — ABNORMAL HIGH (ref 65–99)

## 2016-04-26 LAB — CBC
HCT: 28.6 % — ABNORMAL LOW (ref 40.0–52.0)
HEMOGLOBIN: 9.9 g/dL — AB (ref 13.0–18.0)
MCH: 30.1 pg (ref 26.0–34.0)
MCHC: 34.6 g/dL (ref 32.0–36.0)
MCV: 86.9 fL (ref 80.0–100.0)
Platelets: 127 10*3/uL — ABNORMAL LOW (ref 150–440)
RBC: 3.29 MIL/uL — ABNORMAL LOW (ref 4.40–5.90)
RDW: 13.9 % (ref 11.5–14.5)
WBC: 2.5 10*3/uL — ABNORMAL LOW (ref 3.8–10.6)

## 2016-04-26 LAB — HEMOGLOBIN A1C: Hgb A1c MFr Bld: 6.2 % — ABNORMAL HIGH (ref 4.0–6.0)

## 2016-04-26 MED ORDER — SODIUM BICARBONATE 650 MG PO TABS
650.0000 mg | ORAL_TABLET | Freq: Two times a day (BID) | ORAL | Status: DC
  Administered 2016-04-26 – 2016-04-28 (×4): 650 mg via ORAL
  Filled 2016-04-26 (×4): qty 1

## 2016-04-26 MED ORDER — ASPIRIN 81 MG PO CHEW
81.0000 mg | CHEWABLE_TABLET | ORAL | Status: AC
  Administered 2016-04-27: 81 mg via ORAL
  Filled 2016-04-26: qty 1

## 2016-04-26 MED ORDER — GABAPENTIN 300 MG PO CAPS
300.0000 mg | ORAL_CAPSULE | Freq: Three times a day (TID) | ORAL | Status: DC
  Administered 2016-04-26 – 2016-04-28 (×5): 300 mg via ORAL
  Filled 2016-04-26 (×8): qty 1

## 2016-04-26 MED ORDER — INSULIN ASPART 100 UNIT/ML ~~LOC~~ SOLN: 0.0000 [IU] | Freq: Four times a day (QID) | SUBCUTANEOUS | Status: DC

## 2016-04-26 MED ORDER — ONDANSETRON HCL 4 MG/2ML IJ SOLN: 4.0000 mg | Freq: Four times a day (QID) | INTRAMUSCULAR | Status: DC | PRN

## 2016-04-26 MED ORDER — CLONIDINE HCL 0.1 MG PO TABS
0.2000 mg | ORAL_TABLET | Freq: Two times a day (BID) | ORAL | Status: DC
  Administered 2016-04-26 – 2016-04-28 (×5): 0.2 mg via ORAL
  Filled 2016-04-26 (×5): qty 2

## 2016-04-26 MED ORDER — INSULIN ASPART 100 UNIT/ML ~~LOC~~ SOLN
0.0000 [IU] | Freq: Three times a day (TID) | SUBCUTANEOUS | Status: DC
  Administered 2016-04-26 – 2016-04-27 (×2): 1 [IU] via SUBCUTANEOUS
  Administered 2016-04-27: 2 [IU] via SUBCUTANEOUS
  Filled 2016-04-26: qty 3
  Filled 2016-04-26: qty 2
  Filled 2016-04-26 (×2): qty 1

## 2016-04-26 MED ORDER — ROSUVASTATIN CALCIUM 20 MG PO TABS
40.0000 mg | ORAL_TABLET | Freq: Every day | ORAL | Status: DC
  Administered 2016-04-26: 40 mg via ORAL
  Filled 2016-04-26: qty 2

## 2016-04-26 MED ORDER — CLOPIDOGREL BISULFATE 75 MG PO TABS
75.0000 mg | ORAL_TABLET | Freq: Every day | ORAL | Status: DC
  Administered 2016-04-26 – 2016-04-28 (×3): 75 mg via ORAL
  Filled 2016-04-26 (×3): qty 1

## 2016-04-26 MED ORDER — SODIUM CHLORIDE 0.9 % IV SOLN: 250.0000 mL | INTRAVENOUS | Status: DC | PRN

## 2016-04-26 MED ORDER — SODIUM CHLORIDE 0.9% FLUSH
3.0000 mL | Freq: Two times a day (BID) | INTRAVENOUS | Status: DC
  Administered 2016-04-26: 3 mL via INTRAVENOUS

## 2016-04-26 MED ORDER — ISOSORBIDE MONONITRATE ER 60 MG PO TB24: 60.0000 mg | ORAL_TABLET | Freq: Every day | ORAL | Status: DC

## 2016-04-26 MED ORDER — ACETAMINOPHEN 325 MG PO TABS
650.0000 mg | ORAL_TABLET | Freq: Four times a day (QID) | ORAL | Status: DC | PRN
Start: 2016-04-26 — End: 2016-04-28
  Administered 2016-04-26 – 2016-04-27 (×2): 650 mg via ORAL
  Filled 2016-04-26 (×2): qty 2

## 2016-04-26 MED ORDER — SODIUM CHLORIDE 0.9 % IV SOLN
INTRAVENOUS | Status: DC
  Administered 2016-04-26 – 2016-04-27 (×3): via INTRAVENOUS

## 2016-04-26 MED ORDER — SODIUM CHLORIDE 0.9 % WEIGHT BASED INFUSION: 1.0000 mL/kg/h | INTRAVENOUS | Status: DC

## 2016-04-26 MED ORDER — SODIUM CHLORIDE 0.9 % IV SOLN
INTRAVENOUS | Status: DC
  Administered 2016-04-26: 04:00:00 via INTRAVENOUS

## 2016-04-26 MED ORDER — ASPIRIN 81 MG PO CHEW
324.0000 mg | CHEWABLE_TABLET | Freq: Once | ORAL | Status: AC
  Administered 2016-04-26: 324 mg via ORAL
  Filled 2016-04-26: qty 4

## 2016-04-26 MED ORDER — ISOSORBIDE MONONITRATE ER 30 MG PO TB24
30.0000 mg | ORAL_TABLET | Freq: Every day | ORAL | Status: DC
  Administered 2016-04-26 – 2016-04-28 (×2): 30 mg via ORAL
  Filled 2016-04-26 (×2): qty 1

## 2016-04-26 MED ORDER — SODIUM CHLORIDE 0.9 % WEIGHT BASED INFUSION: 3.0000 mL/kg/h | INTRAVENOUS | Status: AC

## 2016-04-26 MED ORDER — ACETAMINOPHEN 650 MG RE SUPP: 650.0000 mg | Freq: Four times a day (QID) | RECTAL | Status: DC | PRN

## 2016-04-26 MED ORDER — GABAPENTIN 600 MG PO TABS
300.0000 mg | ORAL_TABLET | Freq: Three times a day (TID) | ORAL | Status: DC
  Filled 2016-04-26 (×2): qty 0.5

## 2016-04-26 MED ORDER — ATORVASTATIN CALCIUM 20 MG PO TABS
80.0000 mg | ORAL_TABLET | Freq: Every day | ORAL | Status: DC
  Administered 2016-04-26 – 2016-04-27 (×2): 80 mg via ORAL
  Filled 2016-04-26 (×2): qty 4

## 2016-04-26 MED ORDER — MORPHINE SULFATE (PF) 2 MG/ML IV SOLN: 2.0000 mg | INTRAVENOUS | Status: DC | PRN

## 2016-04-26 MED ORDER — SODIUM CHLORIDE 0.9% FLUSH: 3.0000 mL | INTRAVENOUS | Status: DC | PRN

## 2016-04-26 MED ORDER — ONDANSETRON HCL 4 MG PO TABS: 4.0000 mg | ORAL_TABLET | Freq: Four times a day (QID) | ORAL | Status: DC | PRN

## 2016-04-26 MED ORDER — SODIUM CHLORIDE 0.9% FLUSH: 3.0000 mL | Freq: Two times a day (BID) | INTRAVENOUS | Status: DC

## 2016-04-26 MED ORDER — SODIUM BICARBONATE 8.4 % IV SOLN: INTRAVENOUS | Status: DC

## 2016-04-26 MED ORDER — HEPARIN SODIUM (PORCINE) 5000 UNIT/ML IJ SOLN
5000.0000 [IU] | Freq: Three times a day (TID) | INTRAMUSCULAR | Status: DC
  Administered 2016-04-26 – 2016-04-28 (×7): 5000 [IU] via SUBCUTANEOUS
  Filled 2016-04-26 (×7): qty 1

## 2016-04-26 NOTE — H&P (Addendum)
[facility] - [place] at [facility]   PATIENT NAME: Brandon Wilkerson    MR#:  [id number]  DATE OF BIRTH:  1941-11-30  DATE OF ADMISSION:  04/25/2016  PRIMARY CARE PHYSICIAN: Luna Fuse, MD   REQUESTING/REFERRING PHYSICIAN: York Cerise, MD  CHIEF COMPLAINT:   Chief Complaint  Patient presents with  . Chest Pain    HISTORY OF PRESENT ILLNESS:  Brandon Wilkerson  is a 75 y.o. male who presents with Unstable angina. Patient has known coronary artery disease with stent placement in the past. For the past week or so he has had increasing exertional chest pain. He states that this pain feels exactly like the pain he had prior to his past catheterization where he needed stent placement. He had an interval catheterization done some time ago by Dr. Welton Flakes, and was told that he had numerous blockages and would likely need another catheterization at some point in the future with repeated stenting. On evaluation in the ED today is also found to have acute kidney injury, with his creatinine up to almost 3 from a baseline of about 1.6. He and his wife also reported episode on the afternoon prior to arrival in the ED of some word finding difficulty with some slurred speech as well as some ataxia, concerning for possible TIA. Hospitalists were called for admission for further workup and evaluation of all the above  PAST MEDICAL HISTORY:   Past Medical History  Diagnosis Date  . Hypertension   . PVD (peripheral vascular disease) (HCC)   . Diabetes mellitus without complication (HCC)   . Arthritis   . Rheumatic aortic stenosis   . Sleep apnea   . Chronic leukopenia     PAST SURGICAL HISTORY:   Past Surgical History  Procedure Laterality Date  . Appendectomy    . Coronary angioplasty with stent placement      SOCIAL HISTORY:   Social History  Substance Use Topics  . Smoking status: Former Smoker    Types: Cigarettes    Quit date: 11/22/1979  . Smokeless  tobacco: Never Used  . Alcohol Use: No    FAMILY HISTORY:   Family History  Problem Relation Age of Onset  . Diabetes    . Heart disease    . Hypertension    . Breast cancer      DRUG ALLERGIES:   Allergies  Allergen Reactions  . Pollen Extract Other (See Comments)    Runny nose    MEDICATIONS AT HOME:   Prior to Admission medications   Medication Sig Start Date End Date Taking? Authorizing Provider  amLODipine (NORVASC) 10 MG tablet Take 10 mg by mouth daily.   Yes Historical Provider, MD  aspirin 81 MG tablet Take 81 mg by mouth daily.   Yes Historical Provider, MD  cloNIDine (CATAPRES) 0.3 MG tablet Take 0.3 mg by mouth 2 (two) times daily.   Yes Historical Provider, MD  clopidogrel (PLAVIX) 75 MG tablet Take 75 mg by mouth daily.   Yes Historical Provider, MD  fluticasone (FLONASE) 50 MCG/ACT nasal spray Place 1 spray into both nostrils daily.   Yes Historical Provider, MD  gabapentin (NEURONTIN) 600 MG tablet Take 600 mg by mouth 3 (three) times daily.   Yes Historical Provider, MD  glipiZIDE (GLUCOTROL) 5 MG tablet Take 2.5 mg by mouth 2 (two) times daily.   Yes Historical Provider, MD  isosorbide mononitrate (IMDUR) 60 MG 24 hr tablet Take 60 mg by mouth daily.   Yes  Historical Provider, MD  metFORMIN (GLUCOPHAGE) 1000 MG tablet Take 1,000 mg by mouth 2 (two) times daily with a meal.   Yes Historical Provider, MD  nitroGLYCERIN (NITROSTAT) 0.4 MG SL tablet Place 0.4 mg under the tongue every 5 (five) minutes as needed for chest pain.   Yes Historical Provider, MD  ONE TOUCH ULTRA TEST test strip use as directed;  twice a day OR MORE if needed 11/08/15  Yes Historical Provider, MD  rosuvastatin (CRESTOR) 40 MG tablet Take 40 mg by mouth daily.    Yes Historical Provider, MD  valsartan-hydrochlorothiazide (DIOVAN-HCT) 320-25 MG tablet Take 1 tablet by mouth daily. 10/30/15  Yes Historical Provider, MD    REVIEW OF SYSTEMS:  Review of Systems  Constitutional: Negative  for fever, chills, weight loss and malaise/fatigue.  HENT: Negative for ear pain, hearing loss and tinnitus.   Eyes: Negative for blurred vision, double vision, pain and redness.  Respiratory: Positive for shortness of breath. Negative for cough and hemoptysis.   Cardiovascular: Positive for chest pain. Negative for palpitations, orthopnea and leg swelling.  Gastrointestinal: Negative for nausea, vomiting, abdominal pain, diarrhea and constipation.  Genitourinary: Negative for dysuria, frequency and hematuria.  Musculoskeletal: Negative for back pain, joint pain and neck pain.  Skin:       No acne, rash, or lesions  Neurological: Positive for speech change (now resolved). Negative for dizziness, tremors, focal weakness and weakness.       Ataxia - now resolved  Endo/Heme/Allergies: Negative for polydipsia. Does not bruise/bleed easily.  Psychiatric/Behavioral: Negative for depression. The patient is not nervous/anxious and does not have insomnia.      VITAL SIGNS:   Filed Vitals:   04/25/16 2230 04/25/16 2300 04/25/16 2330 04/25/16 2354  BP: 112/49 109/46 118/55 118/55  Pulse: 56 60 56 64  Temp:      TempSrc:      Resp:    16  Height:      Weight:      SpO2: 97% 99% 93% 97%   Wt Readings from Last 3 Encounters:  04/25/16 91.627 kg (202 lb)  03/26/16 94 kg (207 lb 3.7 oz)  01/21/16 94.433 kg (208 lb 3 oz)    PHYSICAL EXAMINATION:  Physical Exam  Vitals reviewed. Constitutional: He is oriented to person, place, and time. He appears well-developed and well-nourished. No distress.  HENT:  Head: Normocephalic and atraumatic.  Dry mucosal membranes  Eyes: Conjunctivae and EOM are normal. Pupils are equal, round, and reactive to light. No scleral icterus.  Neck: Normal range of motion. Neck supple. No JVD present. No thyromegaly present.  Cardiovascular: Normal rate, regular rhythm and intact distal pulses.  Exam reveals no gallop and no friction rub.   Murmur (III/VI systolic  ejection murmur) heard. Respiratory: Effort normal and breath sounds normal. No respiratory distress. He has no wheezes. He has no rales.  GI: Soft. Bowel sounds are normal. He exhibits no distension. There is no tenderness.  Musculoskeletal: Normal range of motion. He exhibits no edema.  No arthritis, no gout  Lymphadenopathy:    He has no cervical adenopathy.  Neurological: He is alert and oriented to person, place, and time. No cranial nerve deficit.  No dysarthria, no aphasia, 5/5 strength in all extremities, no sensory deficit  Skin: Skin is warm and dry. No rash noted. No erythema.  Psychiatric: He has a normal mood and affect. His behavior is normal. Judgment and thought content normal.    LABORATORY PANEL:   CBC  Recent Labs Lab 04/25/16 2042  WBC 2.5*  HGB 9.9*  HCT 29.0*  PLT 154   ------------------------------------------------------------------------------------------------------------------  Chemistries   Recent Labs Lab 04/25/16 2042  NA 134*  K 4.9  CL 106  CO2 21*  GLUCOSE 123*  BUN 50*  CREATININE 2.69*  CALCIUM 8.9   ------------------------------------------------------------------------------------------------------------------  Cardiac Enzymes  Recent Labs Lab 04/25/16 2042  TROPONINI <0.03   ------------------------------------------------------------------------------------------------------------------  RADIOLOGY:  Dg Chest 2 View  04/25/2016  CLINICAL DATA:  Chest pain EXAM: CHEST  2 VIEW COMPARISON:  None. FINDINGS: Normal heart size. Normal mediastinal contour. No pneumothorax. No pleural effusion. Lungs appear clear, with no acute consolidative airspace disease and no pulmonary edema. IMPRESSION: No active cardiopulmonary disease. Electronically Signed   By: Delbert PhenixJason A Poff M.D.   On: 04/25/2016 21:07    EKG:   Orders placed or performed during the hospital encounter of 04/25/16  . ED EKG within 10 minutes  . ED EKG within 10  minutes    IMPRESSION AND PLAN:  Principal Problem:   Unstable angina (HCC) - initial cardiac enzymes in the ED was negative, though he states that in the past when he had catheterization and stenting his troponins were also negative. We will trend his cardiac enzymes tonight, get an echocardiogram and cardiology consult in the morning. Active Problems:   AKI (acute kidney injury) (HCC) - largely suspect prerenal component as patient states he has been very active in the warm weather over the past couple of days.  We will hydrate him overnight with IV fluids and monitor his creatinine for improvement with morning labs. Avoid nephrotoxins   TIA (transient ischemic attack) - he reports an episode of now resolved symptoms that may be consistent with TIA or small stroke. We will start this workup with an MRI of his brain tomorrow. If he shows any abnormality then he will need full stroke workup and care.   HTN (hypertension) - currently stable, continue home meds   Type 2 diabetes mellitus (HCC) - sliding scale insulin with corresponding glucose checks every 6 hours for now while patient is nothing by mouth for possible cardiac procedure.   CAD (coronary artery disease) - continue home meds and other treatment as above.  All the records are reviewed and case discussed with ED provider. Management plans discussed with the patient and/or family.  DVT PROPHYLAXIS: SubQ heparin  GI PROPHYLAXIS: None  ADMISSION STATUS: Inpatient  CODE STATUS: Full Code Status History    This patient does not have a recorded code status. Please follow your organizational policy for patients in this situation.    Advance Directive Documentation        Most Recent Value   Type of Advance Directive  Healthcare Power of Attorney, Living will   Pre-existing out of facility DNR order (yellow form or pink MOST form)     "MOST" Form in Place?        TOTAL TIME TAKING CARE OF THIS PATIENT: 45 minutes.    Deiondre Harrower,  Maureen Duesing FIELDING 04/26/2016, 12:43 AM  [facility]  Office  [phone]  CC: Primary care physician; Luna FuseEJAN-SIE, SHEIKH AHMED, MD

## 2016-04-26 NOTE — Consult Note (Signed)
[facility]  CONSULT NOTE    Date: 04/26/2016                  Patient Name:  Brandon Wilkerson  MRN: [id number]  DOB: May 09, 1941  Age / Sex: 75 y.o., male         PCP: Luna Fuse, MD                 Service Requesting Consult: Dr. Nemiah Commander                 Reason for Consult: Acute Renal Failure            History of Present Illness: Brandon Wilkerson is a 75 y.o. white  male with noninsulin dependent diabetes mellitus type II on metformin, hypertension, hyperlipidemia, peripheral vascular disease, obstructive sleep apnea (not on CPAP), coronary artery disease, aortic stenosis, chronic neutropenia, appendectomy who was admitted to [facility] on 04/25/2016 for TIA (transient ischemic attack) [G45.9] Ischemic chest pain (HCC) [I20.9] Acute renal failure, unspecified acute renal failure type (HCC) [N17.9]  Patient's wife is at bedside who assists with history taking. Patient states he has been having worsening chest pain on exertion.   Patient states he was told his creatinine was elevated in the month of April. He was scheduled to see his PCP next week where he wanted to discuss this.   Patient has been diagnosed with diabetes mellitus type II for approximately 10 years. Complicated with neuropathy. He denies diabetic retinopathy. He states his hemoglobin A1c is usually around 6.   Patient has been diagnosed with hypertension for last 13 years. He does not check his blood pressure at home. He reports peripheral edema. No history of stroke. Home regimen of valsartan, hydrochlorothiazide, enalapril, clonidine, and amlodipine.    Medications: Outpatient medications: Prescriptions prior to admission  Medication Sig Dispense Refill Last Dose  . amLODipine (NORVASC) 10 MG tablet Take 10 mg by mouth daily.   04/25/2016 at Unknown time  . aspirin 81 MG tablet Take 81 mg by mouth daily.   04/25/2016 at Unknown time  . atorvastatin (LIPITOR) 80 MG tablet  Take 80 mg by mouth daily.   04/24/2016 at 2100  . cloNIDine (CATAPRES) 0.3 MG tablet Take 0.3 mg by mouth 2 (two) times daily.   04/25/2016 at Unknown time  . clopidogrel (PLAVIX) 75 MG tablet Take 75 mg by mouth daily.   04/25/2016 at Unknown time  . enalapril (VASOTEC) 20 MG tablet Take 20 mg by mouth 2 (two) times daily.   04/25/2016 at 0630  . fluticasone (FLONASE) 50 MCG/ACT nasal spray Place 1 spray into both nostrils daily.   04/25/2016 at Unknown time  . gabapentin (NEURONTIN) 600 MG tablet Take 600 mg by mouth 3 (three) times daily.   04/25/2016 at Unknown time  . glipiZIDE (GLUCOTROL) 5 MG tablet Take 2.5 mg by mouth 2 (two) times daily.   04/25/2016 at Unknown time  . isosorbide mononitrate (IMDUR) 60 MG 24 hr tablet Take 60 mg by mouth daily.   04/25/2016 at Unknown time  . metFORMIN (GLUCOPHAGE) 1000 MG tablet Take 1,000 mg by mouth 2 (two) times daily with a meal.   04/25/2016 at Unknown time  . nitroGLYCERIN (NITROSTAT) 0.4 MG SL tablet Place 0.4 mg under the tongue every 5 (five) minutes as needed for chest pain.   Taking  . ONE TOUCH ULTRA TEST test strip use as directed;  twice a day OR MORE if needed  1 Taking  . valsartan-hydrochlorothiazide (DIOVAN-HCT) 320-25 MG tablet Take 1 tablet by mouth daily.  0 04/25/2016 at Unknown time    Current medications: Current Facility-Administered Medications  Medication Dose Route Frequency Provider Last Rate Last Dose  . 0.9 %  sodium chloride infusion   Intravenous Continuous Oralia Manis, MD   Stopped at 04/26/16 1122  . acetaminophen (TYLENOL) tablet 650 mg  650 mg Oral Q6H PRN Oralia Manis, MD       Or  . acetaminophen (TYLENOL) suppository 650 mg  650 mg Rectal Q6H PRN Oralia Manis, MD      . cloNIDine (CATAPRES) tablet 0.2 mg  0.2 mg Oral BID Oralia Manis, MD   0.2 mg at 04/26/16 1131  . clopidogrel (PLAVIX) tablet 75 mg  75 mg Oral Daily Oralia Manis, MD   75 mg at 04/26/16 1132  . heparin injection 5,000 Units  5,000 Units Subcutaneous  Q8H Oralia Manis, MD   5,000 Units at 04/26/16 0522  . insulin aspart (novoLOG) injection 0-9 Units  0-9 Units Subcutaneous TID AC & HS Enid Baas, MD      . isosorbide mononitrate (IMDUR) 24 hr tablet 30 mg  30 mg Oral Daily Enid Baas, MD   30 mg at 04/26/16 1131  . morphine 2 MG/ML injection 2 mg  2 mg Intravenous Q4H PRN Oralia Manis, MD      . ondansetron [facility]) tablet 4 mg  4 mg Oral Q6H PRN Oralia Manis, MD       Or  . ondansetron [facility]) injection 4 mg  4 mg Intravenous Q6H PRN Oralia Manis, MD      . rosuvastatin (CRESTOR) tablet 40 mg  40 mg Oral Daily Oralia Manis, MD   40 mg at 04/26/16 1132  . sodium chloride flush (NS) 0.9 % injection 3 mL  3 mL Intravenous Q12H Oralia Manis, MD   3 mL at 04/26/16 0400      Allergies: Allergies  Allergen Reactions  . Pollen Extract Other (See Comments)    Runny nose      Past Medical History: Past Medical History  Diagnosis Date  . Hypertension   . PVD (peripheral vascular disease) (HCC)   . Diabetes mellitus without complication (HCC)   . Arthritis   . Rheumatic aortic stenosis   . Sleep apnea   . Chronic leukopenia      Past Surgical History: Past Surgical History  Procedure Laterality Date  . Appendectomy    . Coronary angioplasty with stent placement       Family History: Family History  Problem Relation Age of Onset  . Diabetes    . Heart disease    . Hypertension    . Breast cancer       Social History: Social History   Social History  . Marital Status: Married    Spouse Name: N/A  . Number of Children: N/A  . Years of Education: N/A   Occupational History  . Not on file.   Social History Main Topics  . Smoking status: Former Smoker    Types: Cigarettes    Quit date: 11/22/1979  . Smokeless tobacco: Never Used  . Alcohol Use: No  . Drug Use: No  . Sexual Activity: Not on file   Other Topics Concern  . Not on file   Social History Narrative     Review of  Systems: Review of Systems  Constitutional: Negative.  Negative for fever, chills, weight loss, malaise/fatigue and diaphoresis.  HENT:  Negative.  Negative for congestion, ear discharge, ear pain, hearing loss, nosebleeds, sore throat and tinnitus.   Eyes: Negative.  Negative for blurred vision, double vision, photophobia, pain, discharge and redness.  Respiratory: Negative.  Negative for cough, hemoptysis, sputum production, shortness of breath, wheezing and stridor.   Cardiovascular: Positive for leg swelling. Negative for chest pain, palpitations, orthopnea, claudication and PND.  Gastrointestinal: Positive for diarrhea. Negative for heartburn, nausea, vomiting, abdominal pain, constipation, blood in stool and melena.  Genitourinary: Negative.  Negative for dysuria, urgency, frequency, hematuria and flank pain.  Musculoskeletal: Negative.  Negative for myalgias, back pain, joint pain, falls and neck pain.  Skin: Negative.  Negative for itching and rash.  Neurological: Negative.  Negative for dizziness, tingling, tremors, sensory change, speech change, focal weakness, seizures, loss of consciousness, weakness and headaches.  Endo/Heme/Allergies: Negative.  Negative for environmental allergies and polydipsia. Does not bruise/bleed easily.  Psychiatric/Behavioral: Negative.  Negative for depression, suicidal ideas, hallucinations, memory loss and substance abuse. The patient is not nervous/anxious and does not have insomnia.     Vital Signs: Blood pressure 144/61, pulse 51, temperature 98.3 F (36.8 C), temperature source Oral, resp. rate 17, height 6' 1.5" (1.867 m), weight 95.301 kg (210 lb 1.6 oz), SpO2 94 %.  Weight trends: Filed Weights   04/25/16 2035 04/26/16 0308  Weight: 91.627 kg (202 lb) 95.301 kg (210 lb 1.6 oz)    Physical Exam: General: NAD, laying in bed  Head: Normocephalic, atraumatic. Moist oral mucosal membranes  Eyes: Anicteric, PERRL  Neck: Supple, trachea midline   Lungs:  Clear to auscultation  Heart: Regular rate and rhythm  Abdomen:  Soft, nontender, obese  Extremities: no peripheral edema.  Neurologic: Nonfocal, moving all four extremities  Skin: No lesions        Lab results: Basic Metabolic Panel:  Recent Labs Lab 04/25/16 2042 04/26/16 0418  NA 134* 135  K 4.9 4.9  CL 106 109  CO2 21* 19*  GLUCOSE 123* 106*  BUN 50* 49*  CREATININE 2.69* 2.48*  CALCIUM 8.9 8.6*    Liver Function Tests: No results for input(s): AST, ALT, ALKPHOS, BILITOT, PROT, ALBUMIN in the last 168 hours. No results for input(s): LIPASE, AMYLASE in the last 168 hours. No results for input(s): AMMONIA in the last 168 hours.  CBC:  Recent Labs Lab 04/22/16 1035 04/25/16 2042 04/26/16 0418  WBC 2.0* 2.5* 2.5*  NEUTROABS 0.1*  --   --   HGB 10.9* 9.9* 9.9*  HCT 31.1* 29.0* 28.6*  MCV 86.4 88.5 86.9  PLT 189 154 127*    Cardiac Enzymes:  Recent Labs Lab 04/25/16 2042 04/26/16 0418 04/26/16 0846  TROPONINI <0.03 <0.03 <0.03    BNP: Invalid input(s): POCBNP  CBG:  Recent Labs Lab 04/26/16 0531  GLUCAP 110*    Microbiology: No results found for this or any previous visit.  Coagulation Studies: No results for input(s): LABPROT, INR in the last 72 hours.  Urinalysis: No results for input(s): COLORURINE, LABSPEC, PHURINE, GLUCOSEU, HGBUR, BILIRUBINUR, KETONESUR, PROTEINUR, UROBILINOGEN, NITRITE, LEUKOCYTESUR in the last 72 hours.  Invalid input(s): APPERANCEUR    Imaging: Dg Chest 2 View  04/25/2016  CLINICAL DATA:  Chest pain EXAM: CHEST  2 VIEW COMPARISON:  None. FINDINGS: Normal heart size. Normal mediastinal contour. No pneumothorax. No pleural effusion. Lungs appear clear, with no acute consolidative airspace disease and no pulmonary edema. IMPRESSION: No active cardiopulmonary disease. Electronically Signed   By: Delbert Phenix M.D.   On: 04/25/2016  21:07   [place] Renal  04/26/2016  CLINICAL DATA:  Acute renal failure EXAM:  RENAL / URINARY TRACT ULTRASOUND COMPLETE COMPARISON:  None. FINDINGS: Right Kidney: Length: 11.3 cm. Mild prominence the right renal pelvis on pre and postvoid images. No caliectasis. The right kidney is otherwise normal. Left Kidney: Length: 14.1 cm. Echogenicity within normal limits. No mass or hydronephrosis visualized. Bladder: Appears normal for degree of bladder distention. IMPRESSION: 1. Mild age indeterminate prominence of the right renal pelvis with no caliectasis. The kidneys are otherwise normal in appearance. Electronically Signed   By: Gerome Samavid  Williams III M.D   On: 04/26/2016 11:32      Assessment & Plan: Brandon Wilkerson is a 75 y.o. white  male with noninsulin dependent diabetes mellitus type II on metformin, hypertension, hyperlipidemia, peripheral vascular disease, obstructive sleep apnea (not on CPAP), coronary artery disease, aortic stenosis, chronic neutropenia, appendectomy who was admitted to [facility] on 04/25/2016  1. Acute Renal Failure on chronic kidney disease stage III with metabolic acidosis: Baseline creatinine of 1.44 from 12/04/2014.  - Agree with IV fluids as he is scheduled for cardiac catheterization. Ideally would like his creatinine to be closer to baseline before proceeding.  - holding enalapril and valsartan (he should not be on both ACE-I and ARB) - Start sodium bicarb PO - check urine studies.   2. Coronary artery disease: discussed case with cardiology. Cardiac catheterization for tomorrow.   3. Hypertension: blood pressure at goal. Seems to have a large home regimen.  - currently holding diuretics - clonidine.   4. Diabetes Mellitus type II with chronic kidney disease: on metformin - hold metformin due to renal insufficiency.    LOS: 0 Zairah Arista 5/21/201711:35 AM

## 2016-04-26 NOTE — Progress Notes (Signed)
*  PRELIMINARY RESULTS* Echocardiogram 2D Echocardiogram has been performed.  Brandon Wilkerson 04/26/2016, 8:10 AM

## 2016-04-26 NOTE — Progress Notes (Signed)
No complaints of chest pain.  Voiding clear yellow urine after IVFs initiated on unit.

## 2016-04-26 NOTE — Consult Note (Signed)
Brandon Wilkerson is a 75 y.o. male  [id number]  Primary Cardiologist: Adrian Blackwater Reason for Consultation: Unstable angina  HPI: Pt began having midsternal exertional chest burning about a month ago which has become progressively more frequent. This most recent episode was accompanied by weakness, nausea and diaphoresis. The discomfort is always resolved with rest. He does have shortness of breath with exertion. He has a history of CAD with stent, aortic stenosis, hyperlipidemia, hypertension and Obstructive sleep apnea. He also has idiopathic chronic neutropenia, treated at the [facility] with [facility] and the patient and his wife were thinking that the chest discomfort could have been related to this.    Review of Systems: Positive for shortness of breath with exertion, positive for exertional chest discomfort, pedal edema late in the day resolved in the morning Negative for palpitations, lightheadedness, of near syncope    Past Medical History  Diagnosis Date  . Hypertension   . PVD (peripheral vascular disease) (HCC)   . Diabetes mellitus without complication (HCC)   . Arthritis   . Rheumatic aortic stenosis   . Sleep apnea   . Chronic leukopenia     Medications Prior to Admission  Medication Sig Dispense Refill  . amLODipine (NORVASC) 10 MG tablet Take 10 mg by mouth daily.    Marland Kitchen aspirin 81 MG tablet Take 81 mg by mouth daily.    Marland Kitchen atorvastatin (LIPITOR) 80 MG tablet Take 80 mg by mouth daily.    . cloNIDine (CATAPRES) 0.3 MG tablet Take 0.3 mg by mouth 2 (two) times daily.    . clopidogrel (PLAVIX) 75 MG tablet Take 75 mg by mouth daily.    . enalapril (VASOTEC) 20 MG tablet Take 20 mg by mouth 2 (two) times daily.    . fluticasone (FLONASE) 50 MCG/ACT nasal spray Place 1 spray into both nostrils daily.    Marland Kitchen gabapentin (NEURONTIN) 600 MG tablet Take 600 mg by mouth 3 (three) times daily.    Marland Kitchen glipiZIDE (GLUCOTROL) 5 MG tablet Take 2.5 mg by mouth 2 (two) times daily.    .  isosorbide mononitrate (IMDUR) 60 MG 24 hr tablet Take 60 mg by mouth daily.    . metFORMIN (GLUCOPHAGE) 1000 MG tablet Take 1,000 mg by mouth 2 (two) times daily with a meal.    . nitroGLYCERIN (NITROSTAT) 0.4 MG SL tablet Place 0.4 mg under the tongue every 5 (five) minutes as needed for chest pain.    . ONE TOUCH ULTRA TEST test strip use as directed;  twice a day OR MORE if needed  1  . valsartan-hydrochlorothiazide (DIOVAN-HCT) 320-25 MG tablet Take 1 tablet by mouth daily.  0     . cloNIDine  0.2 mg Oral BID  . clopidogrel  75 mg Oral Daily  . heparin  5,000 Units Subcutaneous Q8H  . insulin aspart  0-9 Units Subcutaneous TID AC & HS  . isosorbide mononitrate  30 mg Oral Daily  . rosuvastatin  40 mg Oral Daily  . sodium chloride flush  3 mL Intravenous Q12H    Infusions: . sodium chloride Stopped (04/26/16 1122)  .  sodium bicarbonate  infusion 1000 mL      Allergies  Allergen Reactions  . Pollen Extract Other (See Comments)    Runny nose    Social History   Social History  . Marital Status: Married    Spouse Name: N/A  . Number of Children: N/A  . Years of Education: N/A   Occupational History  .  Not on file.   Social History Main Topics  . Smoking status: Former Smoker    Types: Cigarettes    Quit date: 11/22/1979  . Smokeless tobacco: Never Used  . Alcohol Use: No  . Drug Use: No  . Sexual Activity: Not on file   Other Topics Concern  . Not on file   Social History Narrative    Family History  Problem Relation Age of Onset  . Diabetes    . Heart disease    . Hypertension    . Breast cancer      PHYSICAL EXAM: Filed Vitals:   04/26/16 0523 04/26/16 1100  BP: 115/35 144/61  Pulse: 51   Temp: 98.3 F (36.8 C)   Resp: 17      Intake/Output Summary (Last 24 hours) at 04/26/16 1143 Last data filed at 04/26/16 0738  Gross per 24 hour  Intake 413.33 ml  Output    600 ml  Net -186.67 ml    General:  Well appearing. No respiratory  difficulty HEENT: normal Neck: supple. no JVD. Bilateral carotid bruit. No lymphadenopathy or thryomegaly appreciated. Cor: PMI nondisplaced. Regular rate & rhythm. 4/6 harsh systolic murmur- most prominent in left chest and axilla Lungs: clear Abdomen: soft, nontender, nondistended. No hepatosplenomegaly. No bruits or masses. Good bowel sounds. Extremities: no cyanosis, clubbing, rash, edema Neuro: alert & oriented x 3, cranial nerves grossly intact. moves all 4 extremities w/o difficulty. Affect pleasant.  ECG: NSR, new T wave inversion in inferior leads.  Results for orders placed or performed during the hospital encounter of 04/25/16 (from the past 24 hour(s))  Basic metabolic panel     Status: Abnormal   Collection Time: 04/25/16  8:42 PM  Result Value Ref Range   Sodium 134 (L) 135 - 145 mmol/L   Potassium 4.9 3.5 - 5.1 mmol/L   Chloride 106 101 - 111 mmol/L   CO2 21 (L) 22 - 32 mmol/L   Glucose, Bld 123 (H) 65 - 99 mg/dL   BUN 50 (H) 6 - 20 mg/dL   Creatinine, Ser 9.602.69 (H) 0.61 - 1.24 mg/dL   Calcium 8.9 8.9 - 45.410.3 mg/dL   GFR calc non Af Amer 22 (L) >60 mL/min   GFR calc Af Amer 26 (L) >60 mL/min   Anion gap 7 5 - 15  CBC     Status: Abnormal   Collection Time: 04/25/16  8:42 PM  Result Value Ref Range   WBC 2.5 (L) 3.8 - 10.6 K/uL   RBC 3.28 (L) 4.40 - 5.90 MIL/uL   Hemoglobin 9.9 (L) 13.0 - 18.0 g/dL   HCT 09.829.0 (L) 11.940.0 - 14.752.0 %   MCV 88.5 80.0 - 100.0 fL   MCH 30.2 26.0 - 34.0 pg   MCHC 34.1 32.0 - 36.0 g/dL   RDW 82.913.9 56.211.5 - 13.014.5 %   Platelets 154 150 - 440 K/uL  Troponin I     Status: None   Collection Time: 04/25/16  8:42 PM  Result Value Ref Range   Troponin I <0.03 <0.031 ng/mL  Basic metabolic panel     Status: Abnormal   Collection Time: 04/26/16  4:18 AM  Result Value Ref Range   Sodium 135 135 - 145 mmol/L   Potassium 4.9 3.5 - 5.1 mmol/L   Chloride 109 101 - 111 mmol/L   CO2 19 (L) 22 - 32 mmol/L   Glucose, Bld 106 (H) 65 - 99 mg/dL   BUN 49 (H)  6 -  20 mg/dL   Creatinine, Ser 9.60 (H) 0.61 - 1.24 mg/dL   Calcium 8.6 (L) 8.9 - 10.3 mg/dL   GFR calc non Af Amer 24 (L) >60 mL/min   GFR calc Af Amer 28 (L) >60 mL/min   Anion gap 7 5 - 15  CBC     Status: Abnormal   Collection Time: 04/26/16  4:18 AM  Result Value Ref Range   WBC 2.5 (L) 3.8 - 10.6 K/uL   RBC 3.29 (L) 4.40 - 5.90 MIL/uL   Hemoglobin 9.9 (L) 13.0 - 18.0 g/dL   HCT 45.4 (L) 09.8 - 11.9 %   MCV 86.9 80.0 - 100.0 fL   MCH 30.1 26.0 - 34.0 pg   MCHC 34.6 32.0 - 36.0 g/dL   RDW 14.7 82.9 - 56.2 %   Platelets 127 (L) 150 - 440 K/uL  Troponin I     Status: None   Collection Time: 04/26/16  4:18 AM  Result Value Ref Range   Troponin I <0.03 <0.031 ng/mL  Glucose, capillary     Status: Abnormal   Collection Time: 04/26/16  5:31 AM  Result Value Ref Range   Glucose-Capillary 110 (H) 65 - 99 mg/dL  Troponin I     Status: None   Collection Time: 04/26/16  8:46 AM  Result Value Ref Range   Troponin I <0.03 <0.031 ng/mL   Dg Chest 2 View  04/25/2016  CLINICAL DATA:  Chest pain EXAM: CHEST  2 VIEW COMPARISON:  None. FINDINGS: Normal heart size. Normal mediastinal contour. No pneumothorax. No pleural effusion. Lungs appear clear, with no acute consolidative airspace disease and no pulmonary edema. IMPRESSION: No active cardiopulmonary disease. Electronically Signed   By: Delbert Phenix M.D.   On: 04/25/2016 21:07   US Renal  04/26/2016  CLINICAL DATA:  Acute renal failure EXAM: RENAL / URINARY TRACT ULTRASOUND COMPLETE COMPARISON:  None. FINDINGS: Right Kidney: Length: 11.3 cm. Mild prominence the right renal pelvis on pre and postvoid images. No caliectasis. The right kidney is otherwise normal. Left Kidney: Length: 14.1 cm. Echogenicity within normal limits. No mass or hydronephrosis visualized. Bladder: Appears normal for degree of bladder distention. IMPRESSION: 1. Mild age indeterminate prominence of the right renal pelvis with no caliectasis. The kidneys are otherwise  normal in appearance. Electronically Signed   By: Gerome Sam III M.D   On: 04/26/2016 11:32     ASSESSMENT AND PLAN:   Unstable angina. Negative troponins X 3. Worsening exertional chest burning and shortness of breath, most recently this morning when up to BR, resolved with rest.  Carotid bruits and cardiac murmur with known aortic stenosis. Carotid ultra sound and echocardiogram pending.  Plan for cardiac cath in am. Discussed with pt and wife and they are in agreement. Sub lingual nitro prn chest pain. Pt is receiving heparin sub Q.  Renal insufficiency. Dr. Wynelle Link is following and has ordered IV fluids and sodium bicarb. He would like creatinine to be closer to pt baseline of 1.44 for cath. ACE-I/ARB on hold.   Berton Bon, NP 04/26/2016 11:43 AM

## 2016-04-26 NOTE — Progress Notes (Signed)
[facility] [place] [facility]   PATIENT NAME: Brandon HanlyLennye Wilkerson    MR#:  [id number]  DATE OF BIRTH:  11/14/1941  SUBJECTIVE:  CHIEF COMPLAINT:   Chief Complaint  Patient presents with  . Chest Pain   - exertional burning chest pain, similar to the time he had his stent - renal function worse than baseline - transient word finding difficulty yesterday-resolved, no prior h/o CVA  REVIEW OF SYSTEMS:  Review of Systems  Constitutional: Negative for fever, chills and malaise/fatigue.  HENT: Negative for ear discharge, ear pain and nosebleeds.   Eyes: Negative for blurred vision and double vision.  Respiratory: Negative for cough, shortness of breath and wheezing.   Cardiovascular: Positive for chest pain and orthopnea. Negative for palpitations and leg swelling.  Gastrointestinal: Negative for nausea, vomiting, abdominal pain, diarrhea and constipation.  Genitourinary: Negative for dysuria.  Musculoskeletal: Negative for myalgias and neck pain.  Neurological: Negative for dizziness, speech change, focal weakness, seizures and headaches.  Psychiatric/Behavioral: Negative for depression.    DRUG ALLERGIES:   Allergies  Allergen Reactions  . Pollen Extract Other (See Comments)    Runny nose    VITALS:  Blood pressure 135/58, pulse 66, temperature 98.6 F (37 C), temperature source Oral, resp. rate 18, height 6' 1.5" (1.867 m), weight 95.301 kg (210 lb 1.6 oz), SpO2 96 %.  PHYSICAL EXAMINATION:  Physical Exam  GENERAL:  75 y.o.-year-old patient lying in the bed with no acute distress.  EYES: Pupils equal, round, reactive to light and accommodation. No scleral icterus. Extraocular muscles intact.  HEENT: Head atraumatic, normocephalic. Oropharynx and nasopharynx clear.  NECK:  Supple, no jugular venous distention. No thyroid enlargement, no tenderness. Carotid bruits heard LUNGS: Normal breath sounds bilaterally, no wheezing, rales,rhonchi or  crepitation. No use of accessory muscles of respiration.  CARDIOVASCULAR: S1, S2 normal. No  rubs, or gallops. 3/6 loud systolic murmur present ABDOMEN: Soft, nontender, nondistended. Bowel sounds present. No organomegaly or mass.  EXTREMITIES: No pedal edema, cyanosis, or clubbing.  NEUROLOGIC: Cranial nerves II through XII are intact. Muscle strength 5/5 in all extremities. Sensation intact. Gait not checked.  PSYCHIATRIC: The patient is alert and oriented x 3.  SKIN: No obvious rash, lesion, or ulcer.    LABORATORY PANEL:   CBC  Recent Labs Lab 04/26/16 0418  WBC 2.5*  HGB 9.9*  HCT 28.6*  PLT 127*   ------------------------------------------------------------------------------------------------------------------  Chemistries   Recent Labs Lab 04/26/16 0418  NA 135  K 4.9  CL 109  CO2 19*  GLUCOSE 106*  BUN 49*  CREATININE 2.48*  CALCIUM 8.6*   ------------------------------------------------------------------------------------------------------------------  Cardiac Enzymes  Recent Labs Lab 04/26/16 0846  TROPONINI <0.03   ------------------------------------------------------------------------------------------------------------------  RADIOLOGY:  Dg Chest 2 View  04/25/2016  CLINICAL DATA:  Chest pain EXAM: CHEST  2 VIEW COMPARISON:  None. FINDINGS: Normal heart size. Normal mediastinal contour. No pneumothorax. No pleural effusion. Lungs appear clear, with no acute consolidative airspace disease and no pulmonary edema. IMPRESSION: No active cardiopulmonary disease. Electronically Signed   By: Delbert PhenixJason A Poff M.D.   On: 04/25/2016 21:07   [place] Renal  04/26/2016  CLINICAL DATA:  Acute renal failure EXAM: RENAL / URINARY TRACT ULTRASOUND COMPLETE COMPARISON:  None. FINDINGS: Right Kidney: Length: 11.3 cm. Mild prominence the right renal pelvis on pre and postvoid images. No caliectasis. The right kidney is otherwise normal. Left Kidney: Length: 14.1 cm. Echogenicity  within normal limits. No mass or hydronephrosis visualized. Bladder: Appears normal  for degree of bladder distention. IMPRESSION: 1. Mild age indeterminate prominence of the right renal pelvis with no caliectasis. The kidneys are otherwise normal in appearance. Electronically Signed   By: Gerome Sam III M.D   On: 04/26/2016 11:32    EKG:   Orders placed or performed during the hospital encounter of 04/25/16  . ED EKG within 10 minutes  . ED EKG within 10 minutes    ASSESSMENT AND PLAN:   75 year old male with past medical history significant for CAD status post stents, peripheral vascular disease, diabetes and hypertension presents to the hospital secondary to chest pain and word finding difficulties.  #1 unstable angina-but prior history of CAD and stents. -Will need cardiac catheterization with his presentation. Continue to monitor troponins. -Hold off IV heparin at this time. -Cardiology consulted. -Continue cardiac medications. Patient on Imdur. Also on statin. -Cannot take beta blocker as bradycardic. 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 Could be prerenal. Continue IV fluids -Avoid nephrotoxins. Medications adjusted by nephrologist. -Appreciate nephrology consult. Renal ultrasound ordered. - For cardiac catheterization, would like to see his creatinine close to baseline. -Started on oral sodium bicarbonate  #3 TIA symptoms with transient word finding difficulties and slurred speech.-Completely resolved at this time. -Agree with MRI. High risk factors present. -Continue statin. Patient on Plavix -Echocardiogram and carotid Dopplers ordered  #4 chronic neutropenia-inconclusive after multiple bone marrow biopsies. -Following up with the cancer center. On every 3 weeks Neupogen injections  #5 DVT prophylaxis-on subcutaneous heparin    All the records  are reviewed and case discussed with Care Management/Social Workerr. Management plans discussed with the patient, family and they are in agreement.  CODE STATUS: Full Code  TOTAL TIME TAKING CARE OF THIS PATIENT: 37 minutes.   POSSIBLE D/C IN 1-2 DAYS, DEPENDING ON CLINICAL CONDITION.   Enid Baas M.D on 04/26/2016 at 12:20 PM  Between 7am to 6pm - Pager - [phone]  After 6pm go to [email or web address] - password [email or web address] [facility]  [facility] [place] [facility]  Office  [phone]  CC: Primary care physician; Luna Fuse, MD

## 2016-04-26 NOTE — Progress Notes (Signed)
Per Dr. Nemiah CommanderKalisetti, now that patient has a diet order we can change CBGs and Correction Insulin to ACHS instead of Q6H.

## 2016-04-27 ENCOUNTER — Encounter
Admission: EM | Disposition: A | Discharge status: Home / Self Care | Payer: Self-pay | Source: Home / Self Care | Attending: Internal Medicine

## 2016-04-27 ENCOUNTER — Inpatient Hospital Stay: Payer: Medicare HMO

## 2016-04-27 HISTORY — PX: CARDIAC CATHETERIZATION: SHX172

## 2016-04-27 LAB — BASIC METABOLIC PANEL
Anion gap: 5 (ref 5–15)
BUN: 45 mg/dL — AB (ref 6–20)
CALCIUM: 8.6 mg/dL — AB (ref 8.9–10.3)
CO2: 22 mmol/L (ref 22–32)
CREATININE: 2.17 mg/dL — AB (ref 0.61–1.24)
Chloride: 111 mmol/L (ref 101–111)
GFR calc non Af Amer: 29 mL/min — ABNORMAL LOW (ref >60)
GFR, EST AFRICAN AMERICAN: 33 mL/min — AB (ref >60)
GLUCOSE: 134 mg/dL — AB (ref 65–99)
Potassium: 4.6 mmol/L (ref 3.5–5.1)
Sodium: 138 mmol/L (ref 135–145)

## 2016-04-27 LAB — GLUCOSE, CAPILLARY
Glucose-Capillary: 105 mg/dL — ABNORMAL HIGH (ref 65–99)
Glucose-Capillary: 162 mg/dL — ABNORMAL HIGH (ref 65–99)
Glucose-Capillary: 121 mg/dL — ABNORMAL HIGH (ref 65–99)

## 2016-04-27 LAB — PROTIME-INR
INR: 1.12
PROTHROMBIN TIME: 14.6 s (ref 11.4–15.0)

## 2016-04-27 SURGERY — LEFT HEART CATH AND CORONARY ANGIOGRAPHY
Anesthesia: Moderate Sedation | Laterality: Right

## 2016-04-27 MED ORDER — BIVALIRUDIN 250 MG IV SOLR
250.0000 mg | INTRAVENOUS | Status: DC | PRN
  Administered 2016-04-27: 1.75 mg/kg/h via INTRAVENOUS

## 2016-04-27 MED ORDER — CLOPIDOGREL BISULFATE 75 MG PO TABS: 75.0000 mg | ORAL_TABLET | Freq: Every day | ORAL | Status: DC

## 2016-04-27 MED ORDER — ONDANSETRON HCL 4 MG/2ML IJ SOLN: 4.0000 mg | Freq: Four times a day (QID) | INTRAMUSCULAR | Status: DC | PRN

## 2016-04-27 MED ORDER — CLOPIDOGREL BISULFATE 75 MG PO TABS
ORAL_TABLET | ORAL | Status: DC | PRN
  Administered 2016-04-27: 300 mg via ORAL

## 2016-04-27 MED ORDER — SODIUM CHLORIDE 0.9 % WEIGHT BASED INFUSION: 3.0000 mL/kg/h | INTRAVENOUS | Status: AC

## 2016-04-27 MED ORDER — SODIUM CHLORIDE 0.9% FLUSH: 3.0000 mL | INTRAVENOUS | Status: DC | PRN

## 2016-04-27 MED ORDER — ASPIRIN 81 MG PO CHEW
CHEWABLE_TABLET | ORAL | Status: AC
  Filled 2016-04-27: qty 3

## 2016-04-27 MED ORDER — SODIUM CHLORIDE 0.9 % WEIGHT BASED INFUSION: 1.0000 mL/kg/h | INTRAVENOUS | Status: AC

## 2016-04-27 MED ORDER — NITROGLYCERIN 5 MG/ML IV SOLN
INTRAVENOUS | Status: AC
  Filled 2016-04-27: qty 10

## 2016-04-27 MED ORDER — SODIUM CHLORIDE 0.9 % IV SOLN
INTRAVENOUS | Status: DC
  Administered 2016-04-28: 06:00:00 via INTRAVENOUS

## 2016-04-27 MED ORDER — HEPARIN (PORCINE) IN NACL 2-0.9 UNIT/ML-% IJ SOLN
INTRAMUSCULAR | Status: AC
  Filled 2016-04-27: qty 500

## 2016-04-27 MED ORDER — SODIUM CHLORIDE 0.9 % IV SOLN: 250.0000 mL | INTRAVENOUS | Status: DC | PRN

## 2016-04-27 MED ORDER — CLOPIDOGREL BISULFATE 75 MG PO TABS
ORAL_TABLET | ORAL | Status: AC
  Filled 2016-04-27: qty 8

## 2016-04-27 MED ORDER — ACETAMINOPHEN 325 MG PO TABS: 650.0000 mg | ORAL_TABLET | ORAL | Status: DC | PRN

## 2016-04-27 MED ORDER — ASPIRIN 81 MG PO CHEW
CHEWABLE_TABLET | ORAL | Status: DC | PRN
  Administered 2016-04-27: 243 mg via ORAL

## 2016-04-27 MED ORDER — SODIUM CHLORIDE 0.9% FLUSH
3.0000 mL | Freq: Two times a day (BID) | INTRAVENOUS | Status: DC
  Administered 2016-04-27: 3 mL via INTRAVENOUS

## 2016-04-27 MED ORDER — FENTANYL CITRATE (PF) 100 MCG/2ML IJ SOLN
INTRAMUSCULAR | Status: AC
  Filled 2016-04-27: qty 2

## 2016-04-27 MED ORDER — SODIUM CHLORIDE 0.9% FLUSH
3.0000 mL | Freq: Two times a day (BID) | INTRAVENOUS | Status: DC
  Administered 2016-04-27 – 2016-04-28 (×3): 3 mL via INTRAVENOUS

## 2016-04-27 MED ORDER — HYDROCODONE-ACETAMINOPHEN 5-325 MG PO TABS: 1.0000 | ORAL_TABLET | Freq: Four times a day (QID) | ORAL | Status: DC | PRN

## 2016-04-27 MED ORDER — BIVALIRUDIN BOLUS VIA INFUSION - CUPID
INTRAVENOUS | Status: DC | PRN
  Administered 2016-04-27: 71.25 mg via INTRAVENOUS

## 2016-04-27 MED ORDER — BIVALIRUDIN 250 MG IV SOLR
INTRAVENOUS | Status: AC
  Filled 2016-04-27: qty 250

## 2016-04-27 MED ORDER — FENTANYL CITRATE (PF) 100 MCG/2ML IJ SOLN
INTRAMUSCULAR | Status: DC | PRN
  Administered 2016-04-27: 25 ug via INTRAVENOUS

## 2016-04-27 MED ORDER — ASPIRIN EC 325 MG PO TBEC
325.0000 mg | DELAYED_RELEASE_TABLET | Freq: Every day | ORAL | Status: DC
  Administered 2016-04-28: 325 mg via ORAL
  Filled 2016-04-27: qty 1

## 2016-04-27 MED ORDER — IOPAMIDOL (ISOVUE-300) INJECTION 61%
INTRAVENOUS | Status: DC | PRN
  Administered 2016-04-27: 235 mL via INTRA_ARTERIAL

## 2016-04-27 MED ORDER — MIDAZOLAM HCL 2 MG/2ML IJ SOLN
INTRAMUSCULAR | Status: DC | PRN
  Administered 2016-04-27: 1 mg via INTRAVENOUS

## 2016-04-27 MED ORDER — MIDAZOLAM HCL 2 MG/2ML IJ SOLN
INTRAMUSCULAR | Status: AC
  Filled 2016-04-27: qty 2

## 2016-04-27 SURGICAL SUPPLY — 17 items
BALLN TREK RX 2.5X15 (BALLOONS) ×4
BALLOON TREK RX 2.5X15 (BALLOONS) ×2 IMPLANT
CATH INFINITI 5FR ANG PIGTAIL (CATHETERS) ×4 IMPLANT
CATH INFINITI 5FR JL4 (CATHETERS) ×4 IMPLANT
CATH INFINITI JR4 5F (CATHETERS) ×4 IMPLANT
CATH VISTA GUIDE 6FR JR4 SH (CATHETERS) ×4 IMPLANT
DEVICE CLOSURE MYNXGRIP 6/7F (Vascular Products) ×4 IMPLANT
DEVICE INFLAT 30 PLUS (MISCELLANEOUS) ×4 IMPLANT
KIT MANI 3VAL PERCEP (MISCELLANEOUS) ×4 IMPLANT
NEEDLE PERC 18GX7CM (NEEDLE) ×4 IMPLANT
PACK CARDIAC CATH (CUSTOM PROCEDURE TRAY) ×4 IMPLANT
SHEATH AVANTI 6FR X 11CM (SHEATH) ×4 IMPLANT
SHEATH PINNACLE 5F 10CM (SHEATH) ×4 IMPLANT
STENT XIENCE ALPINE RX 3.0X18 (Permanent Stent) ×4 IMPLANT
WIRE ASAHI GRAND SLAM 180CM (WIRE) ×4 IMPLANT
WIRE EMERALD 3MM-J .035X150CM (WIRE) ×4 IMPLANT
WIRE G HI TQ BMW 190 (WIRE) ×4 IMPLANT

## 2016-04-27 NOTE — Progress Notes (Signed)
No voiced complaints of chest pain this shift.  Understanding voiced of pending cardiac catheterization.

## 2016-04-27 NOTE — Progress Notes (Signed)
SUBJECTIVE: Patient had episode of chest pain over the weekend   Filed Vitals:   04/26/16 2203 04/26/16 2246 04/27/16 0534 04/27/16 0732  BP:   126/50 123/56  Pulse:   51 56  Temp: 99.1 F (37.3 C) 99 F (37.2 C) 98.1 F (36.7 C) 97.6 F (36.4 C)  TempSrc: Oral Oral Oral Oral  Resp:    24  Height:      Weight:   209 lb 8 oz (95.029 kg)   SpO2:   98% 96%    Intake/Output Summary (Last 24 hours) at 04/27/16 0859 Last data filed at 04/27/16 0500  Gross per 24 hour  Intake   1310 ml  Output   3100 ml  Net  -1790 ml    LABS: Basic Metabolic Panel:  Recent Labs  16/09/9604/21/17 0418 04/27/16 0552  NA 135 138  K 4.9 4.6  CL 109 111  CO2 19* 22  GLUCOSE 106* 134*  BUN 49* 45*  CREATININE 2.48* 2.17*  CALCIUM 8.6* 8.6*   Liver Function Tests: No results for input(s): AST, ALT, ALKPHOS, BILITOT, PROT, ALBUMIN in the last 72 hours. No results for input(s): LIPASE, AMYLASE in the last 72 hours. CBC:  Recent Labs  04/25/16 2042 04/26/16 0418  WBC 2.5* 2.5*  HGB 9.9* 9.9*  HCT 29.0* 28.6*  MCV 88.5 86.9  PLT 154 127*   Cardiac Enzymes:  Recent Labs  04/26/16 0418 04/26/16 0846 04/26/16 1626  TROPONINI <0.03 <0.03 0.06*   BNP: Invalid input(s): POCBNP D-Dimer: No results for input(s): DDIMER in the last 72 hours. Hemoglobin A1C:  Recent Labs  04/26/16 0418  HGBA1C 6.2*   Fasting Lipid Panel: No results for input(s): CHOL, HDL, LDLCALC, TRIG, CHOLHDL, LDLDIRECT in the last 72 hours. Thyroid Function Tests: No results for input(s): TSH, T4TOTAL, T3FREE, THYROIDAB in the last 72 hours.  Invalid input(s): FREET3 Anemia Panel: No results for input(s): VITAMINB12, FOLATE, FERRITIN, TIBC, IRON, RETICCTPCT in the last 72 hours.   PHYSICAL EXAM General: Well developed, well nourished, in no acute distress HEENT:  Normocephalic and atramatic Neck:  No JVD.  Lungs: Clear bilaterally to auscultation and percussion. Heart: HRRR . Normal S1 and S2 without  gallops or murmurs.  Abdomen: Bowel sounds are positive, abdomen soft and non-tender  Msk:  Back normal, normal gait. Normal strength and tone for age. Extremities: No clubbing, cyanosis or edema.   Neuro: Alert and oriented X 3. Psych:  Good affect, responds appropriately  TELEMETRY: Sinus rhythm  ASSESSMENT AND PLAN: High-grade lesion in the proximal RCA about 85-90% with normal left ventricle ejection fraction on echocardiogram and moderate to severe aortic stenosis. LV gram was not done because of renal insufficiency. Patient had IV fluid going 300 cc an hour overnight. Renal is on the case. Patient will be getting PCI with Dr. Vennie Homansalderwood this morning.  Principal Problem:   Unstable angina (HCC) Active Problems:   AKI (acute kidney injury) (HCC)   HTN (hypertension)   Type 2 diabetes mellitus (HCC)   CAD (coronary artery disease)   TIA (transient ischemic attack)    Laurier NancyKHAN,Trey Gulbranson A, MD, [facility] 04/27/2016 8:59 AM

## 2016-04-27 NOTE — Progress Notes (Signed)
[facility] [place] [facility]   PATIENT NAME: Brandon Wilkerson    MR#:  [id number]  DATE OF BIRTH:  August 09, 1941  SUBJECTIVE:  CHIEF COMPLAINT:   Chief Complaint  Patient presents with  . Chest Pain   - MRI negative for any CVA, has chest pain dull over the weekend - cardiac cath this AM with RCA 90% lesion- s/p PCI - renal function still not close to baseline  REVIEW OF SYSTEMS:  Review of Systems  Constitutional: Positive for fever. Negative for chills and malaise/fatigue.  HENT: Negative for ear discharge, ear pain and nosebleeds.   Eyes: Negative for blurred vision and double vision.  Respiratory: Negative for cough, shortness of breath and wheezing.   Cardiovascular: Positive for orthopnea. Negative for chest pain, palpitations and leg swelling.  Gastrointestinal: Negative for nausea, vomiting, abdominal pain, diarrhea and constipation.  Genitourinary: Negative for dysuria.  Musculoskeletal: Negative for myalgias and neck pain.  Neurological: Positive for weakness. Negative for dizziness, speech change, focal weakness, seizures and headaches.  Psychiatric/Behavioral: Negative for depression.    DRUG ALLERGIES:   Allergies  Allergen Reactions  . Pollen Extract Other (See Comments)    Runny nose    VITALS:  Blood pressure 148/61, pulse 56, temperature 98.4 F (36.9 C), temperature source Oral, resp. rate 18, height 6' 1.5" (1.867 m), weight 95.029 kg (209 lb 8 oz), SpO2 94 %.  PHYSICAL EXAMINATION:  Physical Exam  GENERAL:  75 y.o.-year-old patient lying in the bed with no acute distress.  EYES: Pupils equal, round, reactive to light and accommodation. No scleral icterus. Extraocular muscles intact.  HEENT: Head atraumatic, normocephalic. Oropharynx and nasopharynx clear.  NECK:  Supple, no jugular venous distention. No thyroid enlargement, no tenderness. Carotid bruits heard LUNGS: Normal breath sounds bilaterally, no wheezing,  rales,rhonchi or crepitation. No use of accessory muscles of respiration.  CARDIOVASCULAR: S1, S2 normal. No  rubs, or gallops. 3/6 loud systolic murmur present ABDOMEN: Soft, nontender, nondistended. Bowel sounds present. No organomegaly or mass.  EXTREMITIES: No pedal edema, cyanosis, or clubbing.  Cath site in right groin appears clean- no bleeding, dressing in place NEUROLOGIC: Cranial nerves II through XII are intact. Muscle strength 5/5 in all extremities. Sensation intact. Gait not checked.  PSYCHIATRIC: The patient is alert and oriented x 3.  SKIN: No obvious rash, lesion, or ulcer.    LABORATORY PANEL:   CBC  Recent Labs Lab 04/26/16 0418  WBC 2.5*  HGB 9.9*  HCT 28.6*  PLT 127*   ------------------------------------------------------------------------------------------------------------------  Chemistries   Recent Labs Lab 04/27/16 0552  NA 138  K 4.6  CL 111  CO2 22  GLUCOSE 134*  BUN 45*  CREATININE 2.17*  CALCIUM 8.6*   ------------------------------------------------------------------------------------------------------------------  Cardiac Enzymes  Recent Labs Lab 04/26/16 1626  TROPONINI 0.06*   ------------------------------------------------------------------------------------------------------------------  RADIOLOGY:  Dg Chest 2 View  04/25/2016  CLINICAL DATA:  Chest pain EXAM: CHEST  2 VIEW COMPARISON:  None. FINDINGS: Normal heart size. Normal mediastinal contour. No pneumothorax. No pleural effusion. Lungs appear clear, with no acute consolidative airspace disease and no pulmonary edema. IMPRESSION: No active cardiopulmonary disease. Electronically Signed   By: Delbert Phenix M.D.   On: 04/25/2016 21:07   Mr Brain Wo Contrast  04/26/2016  CLINICAL DATA:  Coronary artery disease in acute renal insufficiency with episode of word-finding difficulty and slurred speech today. Also some ataxia. EXAM: MRI HEAD WITHOUT CONTRAST TECHNIQUE:  Multiplanar, multiecho pulse sequences of the brain and surrounding  structures were obtained without intravenous contrast. COMPARISON:  Carotid Doppler ultrasound study from the same day. FINDINGS: The diffusion-weighted images demonstrate no evidence for acute or subacute infarction. Acute hemorrhage or mass lesion is present. The ventricles are of normal size. Mild periventricular and scattered subcortical T2 changes are within normal limits for age. Minimal white matter changes extend into the brainstem. Internal auditory canals are within normal limits bilaterally. The cerebellum is unremarkable. Flow is present in the major intracranial arteries. The globes and orbits are intact. Polyps or mucous retention cysts are present in the maxillary sinuses bilaterally. There is some mucosal thickening within the anterior ethmoid air cells and frontal sinuses bilaterally. Bilateral mastoid effusions are present. No obstructing nasopharyngeal lesion is evident. The skullbase is within normal limits. Midline sagittal images demonstrate no significant intracranial lesions. Mild degenerative changes are noted in the upper cervical spine. IMPRESSION: 1. No evidence for acute or subacute infarction. 2. Normal MRI appearance the brain for age. 3. Minimal sinus disease. Electronically Signed   By: Marin Robertshristopher  Mattern M.D.   On: 04/26/2016 15:49   [place] Renal  04/26/2016  CLINICAL DATA:  Acute renal failure EXAM: RENAL / URINARY TRACT ULTRASOUND COMPLETE COMPARISON:  None. FINDINGS: Right Kidney: Length: 11.3 cm. Mild prominence the right renal pelvis on pre and postvoid images. No caliectasis. The right kidney is otherwise normal. Left Kidney: Length: 14.1 cm. Echogenicity within normal limits. No mass or hydronephrosis visualized. Bladder: Appears normal for degree of bladder distention. IMPRESSION: 1. Mild age indeterminate prominence of the right renal pelvis with no caliectasis. The kidneys are otherwise normal in  appearance. Electronically Signed   By: Gerome Samavid  Williams III M.D   On: 04/26/2016 11:32   [place] Carotid Bilateral  04/26/2016  CLINICAL DATA:  Syncopal episode, visual disturbance and TIA. History of hypertension, CAD (post coronary artery stent placement) and diabetes. EXAM: BILATERAL CAROTID DUPLEX ULTRASOUND TECHNIQUE: Wallace CullensGray scale imaging, color Doppler and duplex ultrasound were performed of bilateral carotid and vertebral arteries in the neck. COMPARISON:  None. FINDINGS: Criteria: Quantification of carotid stenosis is based on velocity parameters that correlate the residual internal carotid diameter with NASCET-based stenosis levels, using the diameter of the distal internal carotid lumen as the denominator for stenosis measurement. The following velocity measurements were obtained: RIGHT ICA:  211/58 cm/sec CCA:  116/24 cm/sec SYSTOLIC ICA/CCA RATIO:  1.8 DIASTOLIC ICA/CCA RATIO:  2.4 ECA:  157 cm/sec LEFT ICA:  154/56 cm/sec CCA:  107/35 cm/sec SYSTOLIC ICA/CCA RATIO:  1.4 DIASTOLIC ICA/CCA RATIO:  1.6 ECA:  143 cm/sec RIGHT CAROTID ARTERY: There is a moderate to large amount of eccentric mixed echogenic plaque within the right carotid bulb (representative images 15 and 16), extending to involve the origin and proximal aspect the right internal carotid artery (is 23) which results in elevated peak systolic velocities within the mid and distal aspects of the right internal carotid artery (greatest acquired peak systolic velocity within mid aspect the right ICA measures 211 cm/sec - image 29) RIGHT VERTEBRAL ARTERY:  Antegrade Flow LEFT CAROTID ARTERY: There is a minimal amount of eccentric mixed echogenic plaque within the mid aspect the left common carotid artery (image 41). There is a large amount of eccentric mixed echogenic plaque within the left carotid bulb (images 49 and 52), extending to involve the origin and proximal aspects of the left internal carotid artery (image 56, resulting in elevated peak  systolic velocities within the mid and distal aspect of the left internal carotid artery (greatest acquired peak  systolic velocity within the distal aspect of the left ICA measures 154 cm/sec - image 64). LEFT VERTEBRAL ARTERY:  Antegrade Flow IMPRESSION: Moderate to large amount of bilateral atherosclerotic plaque results in elevated peak systolic velocities within the bilateral internal carotid arteries compatible with the higher end of the 50-69% luminal narrowing range bilaterally, right greater than left. Further evaluation with CTA could performed as clinically indicated. Electronically Signed   By: Simonne Come M.D.   On: 04/26/2016 12:45    EKG:   Orders placed or performed during the hospital encounter of 04/25/16  . ED EKG within 10 minutes  . ED EKG within 10 minutes  . EKG 12-Lead immediately post procedure  . EKG 12-Lead  . EKG 12-Lead immediately post procedure    ASSESSMENT AND PLAN:   75 year old male with past medical history significant for CAD status post stents, peripheral vascular disease, diabetes and hypertension presents to the hospital secondary to chest pain and word finding difficulties.  #1 unstable angina-Secondary to RCA blockage noted on cardiac catheterization.  prior history of CAD and stents. -Status post PCI today -Appreciate Cardiology consulted. -Continue cardiac medications. Patient on Imdur. Also on statin. -Cannot take beta blocker as bradycardic. 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 Could be prerenal. Continue IV fluids -Avoid nephrotoxins. Medications adjusted by nephrologist. -Appreciate nephrology consult. Renal ultrasound With normal kidneys. -Started on oral sodium bicarbonate -Follow up renal function tomorrow as patient had cardiac catheter done today with contrast exposure  #3 TIA symptoms with transient word finding  difficulties and slurred speech.-Completely resolved at this time. - Likely from his acute MI with decreased cerebral perfusion -MRIs negative for any acute findings -Continue statin. Patient on Plavix -Echocardiogram with no acute findings, EF is 65%. -Carotid Dopplers with moderate stenosis. 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 -Following up with the cancer center. On every 3 weeks Neupogen injections - low grade fever last night- check cbc with differential again in AM. UA normal  #5 DVT prophylaxis-on subcutaneous heparin    All the records are reviewed and case discussed with Care Management/Social Workerr. Management plans discussed with the patient, family and they are in agreement.  CODE STATUS: Full Code  TOTAL TIME TAKING CARE OF THIS PATIENT: 37 minutes.   POSSIBLE D/C IN 1-2 DAYS, DEPENDING ON CLINICAL CONDITION.   Enid Baas M.D on 04/27/2016 at 12:31 PM  Between 7am to 6pm - Pager - [phone]  After 6pm go to [email or web address] - password [email or web address] [facility]  [facility]  Office  [phone]  CC: Primary care physician; Luna Fuse, MD

## 2016-04-27 NOTE — Progress Notes (Signed)
[facility]  ROUNDING NOTE   Subjective:  Patient had cardiac catheterization with stent placement in the RCA this a.m. Patient tolerated the procedure well. He has been on IV fluid hydrationprior to the catheterization. Good urine output noted.   Objective:  Vital signs in last 24 hours:  Temp:  [97.6 F (36.4 C)-100.3 F (37.9 C)] 98.4 F (36.9 C) (05/22 1152) Pulse Rate:  [51-66] 56 (05/22 1152) Resp:  [16-24] 18 (05/22 1152) BP: (115-180)/(45-89) 148/61 mmHg (05/22 1152) SpO2:  [92 %-98 %] 94 % (05/22 1152) Weight:  [95.029 kg (209 lb 8 oz)] 95.029 kg (209 lb 8 oz) (05/22 0534)  Weight change: 3.402 kg (7 lb 8 oz) Filed Weights   04/25/16 2035 04/26/16 0308 04/27/16 0534  Weight: 91.627 kg (202 lb) 95.301 kg (210 lb 1.6 oz) 95.029 kg (209 lb 8 oz)    Intake/Output: I/O last 3 completed shifts: In: 1723.3 [P.O.:480; I.V.:1243.3] Out: 3700 [Urine:3700]   Intake/Output this shift:  Total I/O In: -  Out: 575 [Urine:575]  Physical Exam: General: NAD, laying bed  Head: Normocephalic, atraumatic. Moist oral mucosal membranes  Eyes: Anicteric  Neck: Supple, trachea midline  Lungs:  Clear to auscultation, normal effort  Heart: S1S2 no rubs 3/6 SEM  Abdomen:  Soft, nontender, BS present  Extremities: trace peripheral edema.  Neurologic: Nonfocal, moving all four extremities  Skin: No lesions       Basic Metabolic Panel:  Recent Labs Lab 04/25/16 2042 04/26/16 0418 04/27/16 0552  NA 134* 135 138  K 4.9 4.9 4.6  CL 106 109 111  CO2 21* 19* 22  GLUCOSE 123* 106* 134*  BUN 50* 49* 45*  CREATININE 2.69* 2.48* 2.17*  CALCIUM 8.9 8.6* 8.6*    Liver Function Tests: No results for input(s): AST, ALT, ALKPHOS, BILITOT, PROT, ALBUMIN in the last 168 hours. No results for input(s): LIPASE, AMYLASE in the last 168 hours. No results for input(s): AMMONIA in the last 168 hours.  CBC:  Recent Labs Lab 04/22/16 1035 04/25/16 2042 04/26/16 0418   WBC 2.0* 2.5* 2.5*  NEUTROABS 0.1*  --   --   HGB 10.9* 9.9* 9.9*  HCT 31.1* 29.0* 28.6*  MCV 86.4 88.5 86.9  PLT 189 154 127*    Cardiac Enzymes:  Recent Labs Lab 04/25/16 2042 04/26/16 0418 04/26/16 0846 04/26/16 1626  TROPONINI <0.03 <0.03 <0.03 0.06*    BNP: Invalid input(s): POCBNP  CBG:  Recent Labs Lab 04/26/16 0531 04/26/16 1149 04/26/16 1651 04/26/16 2103 04/27/16 1141  GLUCAP 110* 207* 177* 143* 105*    Microbiology: No results found for this or any previous visit.  Coagulation Studies:  Recent Labs  04/27/16 0552  LABPROT 14.6  INR 1.12    Urinalysis:  Recent Labs  04/26/16 1354  COLORURINE STRAW*  LABSPEC 1.006  PHURINE 5.0  GLUCOSEU NEGATIVE  HGBUR NEGATIVE  BILIRUBINUR NEGATIVE  KETONESUR NEGATIVE  PROTEINUR NEGATIVE  NITRITE NEGATIVE  LEUKOCYTESUR NEGATIVE      Imaging: Dg Chest 2 View  04/25/2016  CLINICAL DATA:  Chest pain EXAM: CHEST  2 VIEW COMPARISON:  None. FINDINGS: Normal heart size. Normal mediastinal contour. No pneumothorax. No pleural effusion. Lungs appear clear, with no acute consolidative airspace disease and no pulmonary edema. IMPRESSION: No active cardiopulmonary disease. Electronically Signed   By: Delbert PhenixJason A Poff M.D.   On: 04/25/2016 21:07   Mr Brain Wo Contrast  04/26/2016  CLINICAL DATA:  Coronary artery disease in acute renal insufficiency with episode of  word-finding difficulty and slurred speech today. Also some ataxia. EXAM: MRI HEAD WITHOUT CONTRAST TECHNIQUE: Multiplanar, multiecho pulse sequences of the brain and surrounding structures were obtained without intravenous contrast. COMPARISON:  Carotid Doppler ultrasound study from the same day. FINDINGS: The diffusion-weighted images demonstrate no evidence for acute or subacute infarction. Acute hemorrhage or mass lesion is present. The ventricles are of normal size. Mild periventricular and scattered subcortical T2 changes are within normal limits for  age. Minimal white matter changes extend into the brainstem. Internal auditory canals are within normal limits bilaterally. The cerebellum is unremarkable. Flow is present in the major intracranial arteries. The globes and orbits are intact. Polyps or mucous retention cysts are present in the maxillary sinuses bilaterally. There is some mucosal thickening within the anterior ethmoid air cells and frontal sinuses bilaterally. Bilateral mastoid effusions are present. No obstructing nasopharyngeal lesion is evident. The skullbase is within normal limits. Midline sagittal images demonstrate no significant intracranial lesions. Mild degenerative changes are noted in the upper cervical spine. IMPRESSION: 1. No evidence for acute or subacute infarction. 2. Normal MRI appearance the brain for age. 3. Minimal sinus disease. Electronically Signed   By: Marin Roberts M.D.   On: 04/26/2016 15:49   US Renal  04/26/2016  CLINICAL DATA:  Acute renal failure EXAM: RENAL / URINARY TRACT ULTRASOUND COMPLETE COMPARISON:  None. FINDINGS: Right Kidney: Length: 11.3 cm. Mild prominence the right renal pelvis on pre and postvoid images. No caliectasis. The right kidney is otherwise normal. Left Kidney: Length: 14.1 cm. Echogenicity within normal limits. No mass or hydronephrosis visualized. Bladder: Appears normal for degree of bladder distention. IMPRESSION: 1. Mild age indeterminate prominence of the right renal pelvis with no caliectasis. The kidneys are otherwise normal in appearance. Electronically Signed   By: Gerome Sam III M.D   On: 04/26/2016 11:32   [place] Carotid Bilateral  04/26/2016  CLINICAL DATA:  Syncopal episode, visual disturbance and TIA. History of hypertension, CAD (post coronary artery stent placement) and diabetes. EXAM: BILATERAL CAROTID DUPLEX ULTRASOUND TECHNIQUE: Wallace Cullens scale imaging, color Doppler and duplex ultrasound were performed of bilateral carotid and vertebral arteries in the neck.  COMPARISON:  None. FINDINGS: Criteria: Quantification of carotid stenosis is based on velocity parameters that correlate the residual internal carotid diameter with NASCET-based stenosis levels, using the diameter of the distal internal carotid lumen as the denominator for stenosis measurement. The following velocity measurements were obtained: RIGHT ICA:  211/58 cm/sec CCA:  116/24 cm/sec SYSTOLIC ICA/CCA RATIO:  1.8 DIASTOLIC ICA/CCA RATIO:  2.4 ECA:  157 cm/sec LEFT ICA:  154/56 cm/sec CCA:  107/35 cm/sec SYSTOLIC ICA/CCA RATIO:  1.4 DIASTOLIC ICA/CCA RATIO:  1.6 ECA:  143 cm/sec RIGHT CAROTID ARTERY: There is a moderate to large amount of eccentric mixed echogenic plaque within the right carotid bulb (representative images 15 and 16), extending to involve the origin and proximal aspect the right internal carotid artery (is 23) which results in elevated peak systolic velocities within the mid and distal aspects of the right internal carotid artery (greatest acquired peak systolic velocity within mid aspect the right ICA measures 211 cm/sec - image 29) RIGHT VERTEBRAL ARTERY:  Antegrade Flow LEFT CAROTID ARTERY: There is a minimal amount of eccentric mixed echogenic plaque within the mid aspect the left common carotid artery (image 41). There is a large amount of eccentric mixed echogenic plaque within the left carotid bulb (images 49 and 52), extending to involve the origin and proximal aspects of the left internal carotid  artery (image 56, resulting in elevated peak systolic velocities within the mid and distal aspect of the left internal carotid artery (greatest acquired peak systolic velocity within the distal aspect of the left ICA measures 154 cm/sec - image 64). LEFT VERTEBRAL ARTERY:  Antegrade Flow IMPRESSION: Moderate to large amount of bilateral atherosclerotic plaque results in elevated peak systolic velocities within the bilateral internal carotid arteries compatible with the higher end of the 50-69%  luminal narrowing range bilaterally, right greater than left. Further evaluation with CTA could performed as clinically indicated. Electronically Signed   By: Simonne Come M.D.   On: 04/26/2016 12:45     Medications:   . sodium chloride 75 mL/hr at 04/27/16 1145  . sodium chloride Stopped (04/27/16 1115)  . sodium chloride    . sodium chloride     . aspirin EC  325 mg Oral Daily  . atorvastatin  80 mg Oral q1800  . cloNIDine  0.2 mg Oral BID  . clopidogrel  75 mg Oral Daily  . gabapentin  300 mg Oral TID  . heparin  5,000 Units Subcutaneous Q8H  . insulin aspart  0-9 Units Subcutaneous TID AC & HS  . isosorbide mononitrate  30 mg Oral Daily  . sodium bicarbonate  650 mg Oral BID  . sodium chloride flush  3 mL Intravenous Q12H  . sodium chloride flush  3 mL Intravenous Q12H  . sodium chloride flush  3 mL Intravenous Q12H   sodium chloride, sodium chloride, acetaminophen **OR** acetaminophen, morphine injection, ondansetron **OR** ondansetron (ZOFRAN) IV, sodium chloride flush, sodium chloride flush  Assessment/ Plan:  75 y.o. male with noninsulin dependent diabetes mellitus type II on metformin, hypertension, hyperlipidemia, peripheral vascular disease, obstructive sleep apnea (not on CPAP), coronary artery disease, aortic stenosis, chronic neutropenia, appendectomy who was admitted to [facility] on 04/25/2016  1. Acute Renal Failure on chronic kidney disease stage III with metabolic acidosis: Baseline creatinine of 1.44 from 12/04/2014.  - continue IV fluid hydration with 0.9 normal saline at 75 cc per hour given contrast exposure.  followup serum creatinine tomorrow.   2. Coronary artery disease: status post RCA stent 04/27/16.  3. Hypertension: blood pressure currently 148/61.  For now continue clonidine and Imdur. - patient was previously on valsartan. On hold at the moment for acute renal failure.  4. Diabetes Mellitus type II with chronic kidney disease: patient would benefit from  being back on losartan once his acute renal failure has resolved.  Glycemic control per hospitalist.   LOS: 1 Jannae Fagerstrom 5/22/20171:52 PM

## 2016-04-28 ENCOUNTER — Encounter: Payer: Self-pay | Admitting: Cardiovascular Disease

## 2016-04-28 LAB — BASIC METABOLIC PANEL
Anion gap: 5 (ref 5–15)
BUN: 35 mg/dL — ABNORMAL HIGH (ref 6–20)
CALCIUM: 8.6 mg/dL — AB (ref 8.9–10.3)
CHLORIDE: 112 mmol/L — AB (ref 101–111)
CO2: 23 mmol/L (ref 22–32)
Creatinine, Ser: 1.91 mg/dL — ABNORMAL HIGH (ref 0.61–1.24)
GFR calc Af Amer: 39 mL/min — ABNORMAL LOW (ref >60)
GFR calc non Af Amer: 33 mL/min — ABNORMAL LOW (ref >60)
GLUCOSE: 128 mg/dL — AB (ref 65–99)
Potassium: 4.8 mmol/L (ref 3.5–5.1)
Sodium: 140 mmol/L (ref 135–145)

## 2016-04-28 LAB — CBC WITH DIFFERENTIAL/PLATELET
Basophils Absolute: 0 10*3/uL (ref 0–0.1)
Basophils Relative: 1 %
Eosinophils Absolute: 0.1 10*3/uL (ref 0–0.7)
Eosinophils Relative: 3 %
HEMATOCRIT: 25.3 % — AB (ref 40.0–52.0)
HEMOGLOBIN: 8.7 g/dL — AB (ref 13.0–18.0)
Lymphs Abs: 0.7 10*3/uL — ABNORMAL LOW (ref 1.0–3.6)
MCH: 29.4 pg (ref 26.0–34.0)
MCHC: 34.5 g/dL (ref 32.0–36.0)
MCV: 85.1 fL (ref 80.0–100.0)
MONO ABS: 0.5 10*3/uL (ref 0.2–1.0)
NEUTROS ABS: 1.1 10*3/uL — AB (ref 1.4–6.5)
Platelets: 107 10*3/uL — ABNORMAL LOW (ref 150–440)
RBC: 2.97 MIL/uL — ABNORMAL LOW (ref 4.40–5.90)
RDW: 13.7 % (ref 11.5–14.5)
WBC: 2.4 10*3/uL — ABNORMAL LOW (ref 3.8–10.6)

## 2016-04-28 LAB — GLUCOSE, CAPILLARY
GLUCOSE-CAPILLARY: 173 mg/dL — AB (ref 65–99)
Glucose-Capillary: 115 mg/dL — ABNORMAL HIGH (ref 65–99)

## 2016-04-28 MED ORDER — GABAPENTIN 300 MG PO CAPS: 300.0000 mg | ORAL_CAPSULE | Freq: Three times a day (TID) | ORAL | Status: AC

## 2016-04-28 NOTE — Progress Notes (Signed)
[facility]  ROUNDING NOTE   Subjective:  Creatinine down to 1.9 but still above his baseline. No chest pain this a.m.    Objective:  Vital signs in last 24 hours:  Temp:  [98.1 F (36.7 C)-100.3 F (37.9 C)] 98.1 F (36.7 C) (05/23 1138) Pulse Rate:  [54-77] 54 (05/23 1138) Resp:  [16-18] 16 (05/23 1138) BP: (113-172)/(56-63) 113/56 mmHg (05/23 1138) SpO2:  [94 %-97 %] 94 % (05/23 1138)  Weight change:  Filed Weights   04/25/16 2035 04/26/16 0308 04/27/16 0534  Weight: 91.627 kg (202 lb) 95.301 kg (210 lb 1.6 oz) 95.029 kg (209 lb 8 oz)    Intake/Output: I/O last 3 completed shifts: In: 2183.8 [P.O.:240; I.V.:1943.8] Out: 3155 [Urine:3155]   Intake/Output this shift:  Total I/O In: 360 [P.O.:360] Out: 350 [Urine:350]  Physical Exam: General: NAD, laying bed  Head: Normocephalic, atraumatic. Moist oral mucosal membranes  Eyes: Anicteric  Neck: Supple, trachea midline  Lungs:  Clear to auscultation, normal effort  Heart: S1S2 no rubs 3/6 SEM  Abdomen:  Soft, nontender, BS present  Extremities: trace peripheral edema.  Neurologic: Nonfocal, moving all four extremities  Skin: No lesions       Basic Metabolic Panel:  Recent Labs Lab 04/25/16 2042 04/26/16 0418 04/27/16 0552 04/28/16 0413  NA 134* 135 138 140  K 4.9 4.9 4.6 4.8  CL 106 109 111 112*  CO2 21* 19* 22 23  GLUCOSE 123* 106* 134* 128*  BUN 50* 49* 45* 35*  CREATININE 2.69* 2.48* 2.17* 1.91*  CALCIUM 8.9 8.6* 8.6* 8.6*    Liver Function Tests: No results for input(s): AST, ALT, ALKPHOS, BILITOT, PROT, ALBUMIN in the last 168 hours. No results for input(s): LIPASE, AMYLASE in the last 168 hours. No results for input(s): AMMONIA in the last 168 hours.  CBC:  Recent Labs Lab 04/22/16 1035 04/25/16 2042 04/26/16 0418 04/28/16 0413  WBC 2.0* 2.5* 2.5* 2.4*  NEUTROABS 0.1*  --   --  1.1*  HGB 10.9* 9.9* 9.9* 8.7*  HCT 31.1* 29.0* 28.6* 25.3*  MCV 86.4 88.5 86.9 85.1   PLT 189 154 127* 107*    Cardiac Enzymes:  Recent Labs Lab 04/25/16 2042 04/26/16 0418 04/26/16 0846 04/26/16 1626  TROPONINI <0.03 <0.03 <0.03 0.06*    BNP: Invalid input(s): POCBNP  CBG:  Recent Labs Lab 04/27/16 1141 04/27/16 1613 04/27/16 2114 04/28/16 0744 04/28/16 1135  GLUCAP 105* 162* 121* 115* 173*    Microbiology: No results found for this or any previous visit.  Coagulation Studies:  Recent Labs  04/27/16 0552  LABPROT 14.6  INR 1.12    Urinalysis:  Recent Labs  04/26/16 1354  COLORURINE STRAW*  LABSPEC 1.006  PHURINE 5.0  GLUCOSEU NEGATIVE  HGBUR NEGATIVE  BILIRUBINUR NEGATIVE  KETONESUR NEGATIVE  PROTEINUR NEGATIVE  NITRITE NEGATIVE  LEUKOCYTESUR NEGATIVE      Imaging: Mr Angiogram Neck Wo Contrast  04/27/2016  CLINICAL DATA:  75 year old hypertensive diabetic male with slurred speech and ataxia. Subsequent encounter. EXAM: MRA NECK WITHOUT CONTRAST TECHNIQUE: Multiplanar and multiecho pulse sequences of the neck were obtained without intravenous contrast. Angiographic images of the neck were obtained using MRA technique without and with intravenous contrast. COMPARISON:  MR brain 04/26/2016. Carotid duplex examination 04/26/2016. FINDINGS: Patient has an elevated creatinine and therefore MR angiogram performed without contrast and CT angiogram not ordered. The present noncontrast examination was tailored to specifically evaluate the carotid bifurcation. The entire course of the carotid arteries and vertebral arteries  were not imaged. Right carotid bifurcation: Irregularity of the of proximal right internal carotid artery. Small ulceration may be present. Just beyond this region, 50% diameter stenosis of the right internal carotid artery 1.5 cm above its origin. Ectatic mid to distal cervical segment of the right internal carotid artery. Mild narrowing proximal right external carotid. Left carotid bifurcation: Mild irregularity of the left  internal carotid artery without measurable stenosis. 50% diameter stenosis proximal left external carotid artery. Artifact extends through portion of the vertebral artery greater on the right. Vertebral arteries are of similar caliber. Mild ectasia and and irregularity of the vertebral arteries at the C1 -2 level. IMPRESSION: Patient has an elevated creatinine and therefore MR angiogram performed without contrast and CT angiogram not ordered. The present noncontrast examination was tailored to specifically evaluate the carotid bifurcation. The entire course of the carotid arteries and vertebral arteries were not imaged. Right carotid bifurcation: Irregularity of the of proximal right internal carotid artery. Small ulceration may be present. Just beyond this region, 50% diameter stenosis of the right internal carotid artery 1.5 cm above its origin. Ectatic mid to distal cervical segment of the right internal carotid artery. Left carotid bifurcation: Mild irregularity of the left internal carotid artery without measurable stenosis. Vertebral arteries are of similar caliber. Mild ectasia and and irregularity of the vertebral arteries at the C1 -2 level. Electronically Signed   By: Lacy DuverneySteven  Olson M.D.   On: 04/27/2016 14:30   Mr Brain Wo Contrast  04/26/2016  CLINICAL DATA:  Coronary artery disease in acute renal insufficiency with episode of word-finding difficulty and slurred speech today. Also some ataxia. EXAM: MRI HEAD WITHOUT CONTRAST TECHNIQUE: Multiplanar, multiecho pulse sequences of the brain and surrounding structures were obtained without intravenous contrast. COMPARISON:  Carotid Doppler ultrasound study from the same day. FINDINGS: The diffusion-weighted images demonstrate no evidence for acute or subacute infarction. Acute hemorrhage or mass lesion is present. The ventricles are of normal size. Mild periventricular and scattered subcortical T2 changes are within normal limits for age. Minimal white  matter changes extend into the brainstem. Internal auditory canals are within normal limits bilaterally. The cerebellum is unremarkable. Flow is present in the major intracranial arteries. The globes and orbits are intact. Polyps or mucous retention cysts are present in the maxillary sinuses bilaterally. There is some mucosal thickening within the anterior ethmoid air cells and frontal sinuses bilaterally. Bilateral mastoid effusions are present. No obstructing nasopharyngeal lesion is evident. The skullbase is within normal limits. Midline sagittal images demonstrate no significant intracranial lesions. Mild degenerative changes are noted in the upper cervical spine. IMPRESSION: 1. No evidence for acute or subacute infarction. 2. Normal MRI appearance the brain for age. 3. Minimal sinus disease. Electronically Signed   By: Marin Robertshristopher  Mattern M.D.   On: 04/26/2016 15:49     Medications:   . sodium chloride 75 mL/hr at 04/27/16 1145  . sodium chloride 150 mL/hr at 04/28/16 0530   . aspirin EC  325 mg Oral Daily  . atorvastatin  80 mg Oral q1800  . cloNIDine  0.2 mg Oral BID  . clopidogrel  75 mg Oral Daily  . gabapentin  300 mg Oral TID  . heparin  5,000 Units Subcutaneous Q8H  . insulin aspart  0-9 Units Subcutaneous TID AC & HS  . isosorbide mononitrate  30 mg Oral Daily  . sodium bicarbonate  650 mg Oral BID  . sodium chloride flush  3 mL Intravenous Q12H  . sodium chloride flush  3 mL Intravenous Q12H  . sodium chloride flush  3 mL Intravenous Q12H   sodium chloride, sodium chloride, acetaminophen **OR** acetaminophen, HYDROcodone-acetaminophen, morphine injection, ondansetron **OR** ondansetron (ZOFRAN) IV, sodium chloride flush, sodium chloride flush  Assessment/ Plan:  75 y.o. male with noninsulin dependent diabetes mellitus type II on metformin, hypertension, hyperlipidemia, peripheral vascular disease, obstructive sleep apnea (not on CPAP), coronary artery disease, aortic stenosis,  chronic neutropenia, appendectomy who was admitted to [facility] on 04/25/2016  1. Acute Renal Failure on chronic kidney disease stage III with metabolic acidosis: Baseline creatinine of 1.44 from 12/04/2014.  - Creatinine has come down to 1.9. Previously his baseline creatinine was 1.4 however we do not have a more recent baseline. He will need ongoing monitoring of his renal function as an outpatient. Avoid nephrotoxins as possible.   2. Coronary artery disease: status post RCA stent 04/27/16.  3. Hypertension: Blood pressure currently 113/56. Continue to hold valsartan until he is evaluated as an outpatient.  4. Diabetes Mellitus type II with chronic kidney disease: patient would benefit from being back on valsartan once his acute renal failure has resolved.  Glycemic control per hospitalist.   LOS: 2 Maxwell Martorano 5/23/201712:12 PM

## 2016-04-28 NOTE — Discharge Summary (Signed)
[facility] [place] at [facility]   PATIENT NAME: Brandon Wilkerson    MR#:  [id number]  DATE OF BIRTH:  Aug 14, 1941  DATE OF ADMISSION:  04/25/2016 ADMITTING PHYSICIAN: Oralia Manis, MD  DATE OF DISCHARGE: 04/28/2016 12:55 PM  PRIMARY CARE PHYSICIAN: Luna Fuse, MD    ADMISSION DIAGNOSIS:  TIA (transient ischemic attack) [G45.9] Ischemic chest pain (HCC) [I20.9] Acute renal failure, unspecified acute renal failure type (HCC) [N17.9]  DISCHARGE DIAGNOSIS:  Principal Problem:   Unstable angina (HCC) Active Problems:   AKI (acute kidney injury) (HCC)   HTN (hypertension)   Type 2 diabetes mellitus (HCC)   CAD (coronary artery disease)   TIA (transient ischemic attack)   SECONDARY DIAGNOSIS:   Past Medical History  Diagnosis Date  . Hypertension   . PVD (peripheral vascular disease) (HCC)   . Diabetes mellitus without complication (HCC)   . Arthritis   . Rheumatic aortic stenosis   . Sleep apnea   . Chronic leukopenia     HOSPITAL COURSE:   75 year old male with past medical history significant for CAD status post stents, peripheral vascular disease, diabetes and hypertension presents to the hospital secondary to chest pain and word finding difficulties.  #1 unstable angina-Secondary to RCA blockage 85-90% noted on cardiac catheterization. prior history of CAD and stents. -Status post PCI 04/27/16 -Appreciate Cardiology consulted. -Continue cardiac medications. Patient on Imdur and asa, plavix and statin. -Cannot take beta blocker as bradycardic. No ACEI or ARB due to renal failure  #2 acute renal failure on CK D stage III-Baseline creatinine seems to be around 1.4-1.5 Last cr in April 1.6 at [facility] -Creatinine at 2.8 on admission. Could be prerenal. Improved to 1.9 with IV fluids -Avoid nephrotoxins. ACEI, metformin and ARB discontinued -Appreciate nephrology consult. Renal ultrasound With normal kidneys. - Also had contrast  exposure with cardiac catheterization- outpatient follow up with nephrology  #3 TIA symptoms with transient word finding difficulties and slurred speech.-Completely resolved at this time. - Likely from his CAD with decreased cerebral perfusion -MRIs negative for any acute findings -Continue statin. Patient on Plavix -Echocardiogram with no acute findings, EF is 65%. -Carotid Dopplers with moderate stenosis. MRI of the neck with atherosclerotic occlusion of active 50%. Outpatient monitoring recommended  #4 chronic neutropenia-inconclusive after 2 bone marrow biopsies. -Following up with the cancer center. On every 3 weeks Neupogen injections - low grade fevers sporadically with night sweats. Likely related to that. Please follow up with oncology for the same. -No evidence of infection. Urine analysis is normal and chest x-ray with no infiltrate. -We will hold off on antibiotics  #5 hypertension-Diovan, Ace inhibitor are discontinued. -Continue home doses of Imdur, clonidine. Since blood pressure was within normal range, Norvasc has been held as well at this time.  Patient ambulated prior to discharge and doing well.   DISCHARGE CONDITIONS:   Stable  CONSULTS OBTAINED:  Treatment Team:  Laurier Nancy, MD Lamont Dowdy, MD  DRUG ALLERGIES:   Allergies  Allergen Reactions  . Pollen Extract Other (See Comments)    Runny nose    DISCHARGE MEDICATIONS:   Discharge Medication List as of 04/28/2016 11:46 AM    START taking these medications   Details  gabapentin (NEURONTIN) 300 MG capsule Take 1 capsule (300 mg total) by mouth 3 (three) times daily., Starting 04/28/2016, Until Discontinued, Normal      CONTINUE these medications which have NOT CHANGED   Details  aspirin 81 MG tablet Take 81  mg by mouth daily., Until Discontinued, Historical Med    atorvastatin (LIPITOR) 80 MG tablet Take 80 mg by mouth daily., Until Discontinued, Historical Med    cloNIDine (CATAPRES) 0.3  MG tablet Take 0.3 mg by mouth 2 (two) times daily., Until Discontinued, Historical Med    clopidogrel (PLAVIX) 75 MG tablet Take 75 mg by mouth daily., Until Discontinued, Historical Med    fluticasone (FLONASE) 50 MCG/ACT nasal spray Place 1 spray into both nostrils daily., Until Discontinued, Historical Med    glipiZIDE (GLUCOTROL) 5 MG tablet Take 2.5 mg by mouth 2 (two) times daily., Until Discontinued, Historical Med    isosorbide mononitrate (IMDUR) 60 MG 24 hr tablet Take 60 mg by mouth daily., Until Discontinued, Historical Med    nitroGLYCERIN (NITROSTAT) 0.4 MG SL tablet Place 0.4 mg under the tongue every 5 (five) minutes as needed for chest pain., Until Discontinued, Historical Med    ONE TOUCH ULTRA TEST test strip use as directed;  twice a day OR MORE if needed, Historical Med      STOP taking these medications     amLODipine (NORVASC) 10 MG tablet      enalapril (VASOTEC) 20 MG tablet      gabapentin (NEURONTIN) 600 MG tablet      metFORMIN (GLUCOPHAGE) 1000 MG tablet      valsartan-hydrochlorothiazide (DIOVAN-HCT) 320-25 MG tablet          DISCHARGE INSTRUCTIONS:   1. PCP follow-up in 1-2 weeks 2. Cardiology follow-up in 1 week 3. Nephrology follow-up in 2-3 weeks 4. Oncology follow-up in 1-2 weeks.  If you experience worsening of your admission symptoms, develop shortness of breath, life threatening emergency, suicidal or homicidal thoughts you must seek medical attention immediately by calling 911 or calling your MD immediately  if symptoms less severe.  You Must read complete instructions/literature along with all the possible adverse reactions/side effects for all the Medicines you take and that have been prescribed to you. Take any new Medicines after you have completely understood and accept all the possible adverse reactions/side effects.   Please note  You were cared for by a hospitalist during your hospital stay. If you have any questions about  your discharge medications or the care you received while you were in the hospital after you are discharged, you can call the unit and asked to speak with the hospitalist on call if the hospitalist that took care of you is not available. Once you are discharged, your primary care physician will handle any further medical issues. Please note that NO REFILLS for any discharge medications will be authorized once you are discharged, as it is imperative that you return to your primary care physician (or establish a relationship with a primary care physician if you do not have one) for your aftercare needs so that they can reassess your need for medications and monitor your lab values.    Today   CHIEF COMPLAINT:   Chief Complaint  Patient presents with  . Chest Pain    VITAL SIGNS:  Blood pressure 113/56, pulse 54, temperature 98.1 F (36.7 C), temperature source Oral, resp. rate 16, height 6' 1.5" (1.867 m), weight 95.029 kg (209 lb 8 oz), SpO2 94 %.  I/O:   Intake/Output Summary (Last 24 hours) at 04/28/16 1526 Last data filed at 04/28/16 1001  Gross per 24 hour  Intake   2035 ml  Output   2155 ml  Net   -120 ml    PHYSICAL EXAMINATION:  Physical Exam  GENERAL: 75 y.o.-year-old patient lying in the bed with no acute distress.  EYES: Pupils equal, round, reactive to light and accommodation. No scleral icterus. Extraocular muscles intact.  HEENT: Head atraumatic, normocephalic. Oropharynx and nasopharynx clear.  NECK: Supple, no jugular venous distention. No thyroid enlargement, no tenderness. Carotid bruits heard LUNGS: Normal breath sounds bilaterally, no wheezing, rales,rhonchi or crepitation. No use of accessory muscles of respiration.  CARDIOVASCULAR: S1, S2 normal. No rubs, or gallops. 3/6 loud systolic murmur present ABDOMEN: Soft, nontender, nondistended. Bowel sounds present. No organomegaly or mass.  EXTREMITIES: No pedal edema, cyanosis, or clubbing.  Cath site  in right groin appears clean- no bleeding, dressing in place NEUROLOGIC: Cranial nerves II through XII are intact. Muscle strength 5/5 in all extremities. Sensation intact. Gait not checked.  PSYCHIATRIC: The patient is alert and oriented x 3.  SKIN: No obvious rash, lesion, or ulcer.   DATA REVIEW:   CBC  Recent Labs Lab 04/28/16 0413  WBC 2.4*  HGB 8.7*  HCT 25.3*  PLT 107*    Chemistries   Recent Labs Lab 04/28/16 0413  NA 140  K 4.8  CL 112*  CO2 23  GLUCOSE 128*  BUN 35*  CREATININE 1.91*  CALCIUM 8.6*    Cardiac Enzymes  Recent Labs Lab 04/26/16 1626  TROPONINI 0.06*    Microbiology Results  No results found for this or any previous visit.  RADIOLOGY:  Mr Angiogram Neck Wo Contrast  04/27/2016  CLINICAL DATA:  75 year old hypertensive diabetic male with slurred speech and ataxia. Subsequent encounter. EXAM: MRA NECK WITHOUT CONTRAST TECHNIQUE: Multiplanar and multiecho pulse sequences of the neck were obtained without intravenous contrast. Angiographic images of the neck were obtained using MRA technique without and with intravenous contrast. COMPARISON:  MR brain 04/26/2016. Carotid duplex examination 04/26/2016. FINDINGS: Patient has an elevated creatinine and therefore MR angiogram performed without contrast and CT angiogram not ordered. The present noncontrast examination was tailored to specifically evaluate the carotid bifurcation. The entire course of the carotid arteries and vertebral arteries were not imaged. Right carotid bifurcation: Irregularity of the of proximal right internal carotid artery. Small ulceration may be present. Just beyond this region, 50% diameter stenosis of the right internal carotid artery 1.5 cm above its origin. Ectatic mid to distal cervical segment of the right internal carotid artery. Mild narrowing proximal right external carotid. Left carotid bifurcation: Mild irregularity of the left internal carotid artery without  measurable stenosis. 50% diameter stenosis proximal left external carotid artery. Artifact extends through portion of the vertebral artery greater on the right. Vertebral arteries are of similar caliber. Mild ectasia and and irregularity of the vertebral arteries at the C1 -2 level. IMPRESSION: Patient has an elevated creatinine and therefore MR angiogram performed without contrast and CT angiogram not ordered. The present noncontrast examination was tailored to specifically evaluate the carotid bifurcation. The entire course of the carotid arteries and vertebral arteries were not imaged. Right carotid bifurcation: Irregularity of the of proximal right internal carotid artery. Small ulceration may be present. Just beyond this region, 50% diameter stenosis of the right internal carotid artery 1.5 cm above its origin. Ectatic mid to distal cervical segment of the right internal carotid artery. Left carotid bifurcation: Mild irregularity of the left internal carotid artery without measurable stenosis. Vertebral arteries are of similar caliber. Mild ectasia and and irregularity of the vertebral arteries at the C1 -2 level. Electronically Signed   By: Lacy Duverney M.D.   On:  04/27/2016 14:30    EKG:   Orders placed or performed during the hospital encounter of 04/25/16  . ED EKG within 10 minutes  . ED EKG within 10 minutes  . EKG 12-Lead immediately post procedure  . EKG 12-Lead  . EKG 12-Lead immediately post procedure  . EKG 12-Lead      Management plans discussed with the patient, family and they are in agreement.  CODE STATUS:     Code Status Orders        Start     Ordered   04/26/16 0305  Full code   Continuous     04/26/16 0304    Code Status History    Date Active Date Inactive Code Status Order ID Comments User Context   This patient has a current code status but no historical code status.    Advance Directive Documentation        Most Recent Value   Type of Advance Directive   Healthcare Power of Attorney   Pre-existing out of facility DNR order (yellow form or pink MOST form)     "MOST" Form in Place?        TOTAL TIME TAKING CARE OF THIS PATIENT: 38 minutes.    Enid BaasKALISETTI,Mavis Fichera M.D on 04/28/2016 at 3:26 PM  Between 7am to 6pm - Pager - [phone]  After 6pm go to [email or web address] - password [email or web address] [facility]  [facility]  Office  [phone]  CC: Primary care physician; Luna FuseEJAN-SIE, SHEIKH AHMED, MD

## 2016-04-28 NOTE — Progress Notes (Signed)
AM assessment completed,  Ambulate this am as per MD order, no complaint of pain or discomfort , family at bedside

## 2016-04-28 NOTE — Progress Notes (Signed)
SUBJECTIVE: Patient denies any chest pain   Filed Vitals:   04/27/16 1103 04/27/16 1152 04/27/16 1937 04/28/16 0520  BP: 153/66 148/61 150/63 151/58  Pulse: 56 56 77 62  Temp:  98.4 F (36.9 C) 100.3 F (37.9 C) 98.2 F (36.8 C)  TempSrc:  Oral Oral Oral  Resp: 18 18 16 16   Height:      Weight:      SpO2: 93% 94% 95% 97%    Intake/Output Summary (Last 24 hours) at 04/28/16 0831 Last data filed at 04/28/16 0747  Gross per 24 hour  Intake   1675 ml  Output   2730 ml  Net  -1055 ml    LABS: Basic Metabolic Panel:  Recent Labs  04/54/0905/22/17 0552 04/28/16 0413  NA 138 140  K 4.6 4.8  CL 111 112*  CO2 22 23  GLUCOSE 134* 128*  BUN 45* 35*  CREATININE 2.17* 1.91*  CALCIUM 8.6* 8.6*   Liver Function Tests: No results for input(s): AST, ALT, ALKPHOS, BILITOT, PROT, ALBUMIN in the last 72 hours. No results for input(s): LIPASE, AMYLASE in the last 72 hours. CBC:  Recent Labs  04/26/16 0418 04/28/16 0413  WBC 2.5* 2.4*  NEUTROABS  --  1.1*  HGB 9.9* 8.7*  HCT 28.6* 25.3*  MCV 86.9 85.1  PLT 127* 107*   Cardiac Enzymes:  Recent Labs  04/26/16 0418 04/26/16 0846 04/26/16 1626  TROPONINI <0.03 <0.03 0.06*   BNP: Invalid input(s): POCBNP D-Dimer: No results for input(s): DDIMER in the last 72 hours. Hemoglobin A1C:  Recent Labs  04/26/16 0418  HGBA1C 6.2*   Fasting Lipid Panel: No results for input(s): CHOL, HDL, LDLCALC, TRIG, CHOLHDL, LDLDIRECT in the last 72 hours. Thyroid Function Tests: No results for input(s): TSH, T4TOTAL, T3FREE, THYROIDAB in the last 72 hours.  Invalid input(s): FREET3 Anemia Panel: No results for input(s): VITAMINB12, FOLATE, FERRITIN, TIBC, IRON, RETICCTPCT in the last 72 hours.   PHYSICAL EXAM General: Well developed, well nourished, in no acute distress HEENT:  Normocephalic and atramatic Neck:  No JVD.  Lungs: Clear bilaterally to auscultation and percussion. Heart: HRRR . Normal S1 and S2 without gallops or  murmurs.  Abdomen: Bowel sounds are positive, abdomen soft and non-tender  Msk:  Back normal, normal gait. Normal strength and tone for age. Extremities: No clubbing, cyanosis or edema.   Neuro: Alert and oriented X 3. Psych:  Good affect, responds appropriately  TELEMETRY:Sinus rhythm  ASSESSMENT AND PLAN: Status post PCI of proximal right coronary artery with drug-eluting stent. Right groin looks okay at the site of catheterization. May go home today with follow-up in the office Monday at 9 AM and continue 81 mg aspirin and Plavix and rest of the medication.  Principal Problem:   Unstable angina (HCC) Active Problems:   AKI (acute kidney injury) (HCC)   HTN (hypertension)   Type 2 diabetes mellitus (HCC)   CAD (coronary artery disease)   TIA (transient ischemic attack)    Brandon NancyKHAN,Mats Jeanlouis A, MD, [facility] 04/28/2016 8:31 AM

## 2016-04-28 NOTE — Progress Notes (Signed)
Patient ambulate again no distress noted,  Patient being discharge home, discharge instruction provided, iv removed, tele removed, patient discharge home.

## 2016-04-28 NOTE — Progress Notes (Signed)
approximately around 2000 patient had a low grade fever. Tylenol given will continue to monitor.

## 2016-05-08 ENCOUNTER — Inpatient Hospital Stay: Payer: Medicare HMO | Attending: Internal Medicine | Admitting: Internal Medicine

## 2016-05-08 VITALS — BP 127/66 | HR 59 | Temp 94.7°F | Resp 18 | Wt 203.9 lb

## 2016-05-08 DIAGNOSIS — N189 Chronic kidney disease, unspecified: Secondary | ICD-10-CM | POA: Diagnosis not present

## 2016-05-08 DIAGNOSIS — I739 Peripheral vascular disease, unspecified: Secondary | ICD-10-CM

## 2016-05-08 DIAGNOSIS — M1612 Unilateral primary osteoarthritis, left hip: Secondary | ICD-10-CM | POA: Diagnosis not present

## 2016-05-08 DIAGNOSIS — Z79899 Other long term (current) drug therapy: Secondary | ICD-10-CM | POA: Insufficient documentation

## 2016-05-08 DIAGNOSIS — Z7982 Long term (current) use of aspirin: Secondary | ICD-10-CM | POA: Insufficient documentation

## 2016-05-08 DIAGNOSIS — R61 Generalized hyperhidrosis: Secondary | ICD-10-CM | POA: Insufficient documentation

## 2016-05-08 DIAGNOSIS — I2510 Atherosclerotic heart disease of native coronary artery without angina pectoris: Secondary | ICD-10-CM | POA: Diagnosis not present

## 2016-05-08 DIAGNOSIS — Z95818 Presence of other cardiac implants and grafts: Secondary | ICD-10-CM | POA: Diagnosis not present

## 2016-05-08 DIAGNOSIS — E1122 Type 2 diabetes mellitus with diabetic chronic kidney disease: Secondary | ICD-10-CM | POA: Insufficient documentation

## 2016-05-08 DIAGNOSIS — R509 Fever, unspecified: Secondary | ICD-10-CM | POA: Insufficient documentation

## 2016-05-08 DIAGNOSIS — I129 Hypertensive chronic kidney disease with stage 1 through stage 4 chronic kidney disease, or unspecified chronic kidney disease: Secondary | ICD-10-CM | POA: Diagnosis not present

## 2016-05-08 DIAGNOSIS — D708 Other neutropenia: Secondary | ICD-10-CM | POA: Insufficient documentation

## 2016-05-08 DIAGNOSIS — D649 Anemia, unspecified: Secondary | ICD-10-CM

## 2016-05-08 NOTE — Progress Notes (Signed)
[name]  [phone] [phone]     ID: Brandon Wilkerson OB: 1941/05/31  MR#: [id number]  CSN#:[id number]  Patient Care Team: Jodi Marble, MD as PCP - General (Internal Medicine)  CHIEF COMPLAINT/DIAGNOSIS:  Leukopenia/Neutropenia, Mild Anemia - Mild, asymptomatic, of unclear etiology. Workup so far unremarkable as detailed below.  Bone marrow biopsy 05/10/13 - no diagnostic morphologic evidence of B-cell lymphoproliferative disorder or other hematopoietic neoplasia identified.  Normocellular to slightly hypercellular marrow for age 66-50% with maturing trilineage hematopoiesis, storage iron present, slight patchy increase in reticulin  Flow study negative for any B-cell monoclonality, nonspecific myeloid and normocytic findings with no increase in blasts,relatively increased eosinophils 7% is nonspecific,relatively increased normal B-cell precursors (hematogones)and.  Normal male karyotype, 46XY.  Labs done on 11/02/12 - Hb 13.3, platelets 172K, retic 0.036, WBC 3400 with 38% neutrophils, 35% lymphocytes, 10% variant lymphocytes, 12% monocytes. Iron study, B12, folate, ANA, serum immunoelectrophoresis (SIEP), PT, PTT, LDH, haptoglobin, Coombs test, HBsAg, HCV antibody, and HIV antibody all unremarkable.   05/03/13 - Peripheral blood Flow Cytometry. A very small (0.3% of leukocytes) B cell clone is detected. Previous phenotyping results showed similar findings. The clone is very small and is not diagnostic of any specific type of lymphoma. If the patient has a previous history of lymphoma, the finding is consistent with persistent involvement by lymphoma. 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  07/23/15 -  Bone Marrow Biopsy Report  -  nonspecific marrow findings with no diagnostic morphologic or immunophenotypic evidence of hematopoietic neoplasia. Mildly hypercellular marrow for age (40-50%) with myeloid hypoplasia,  increased eosinophils, erythroid hyperplasia with mild nonspecific dyserythropoiesis and mild megakaryocytic atypia. Diffuse mild to focally moderate increase in reticulin. Storage iron present. Flow cytometry reports relatively decreased neutrophilic cells with no immunophenotypic abnormalities or increase in blasts, relatively increased eosinophils (17%) nonspecific. Cytogenetics normal 46XY. SNP microarray result is Normal Male. The treatment with short course of steroids was attempted in November 2016, however there was no response. Treatment with G-CSF was initiated in early December 2016  HISTORY OF PRESENT ILLNESS:   75 year old male patient with a history of neutropenia of unclear etiology/ currently on Neupogen daily 2 every 3 weeks is here for follow-up.  Patient was admitted to the hospital for chest pain- had a cardiac evaluation had a stent placed. Given possible TIA patient had MRI of the brain was negative for any stroke. Patient was also noted to be in acute renal failure with a creatinine of 2.69. Most recently checked creatinine was 1.9 approximately week ago.  In the last few weeks patient noted to have at least 4 episodes of drenching night sweats; and also low-grade fever up to 100.4. The fevers improved after taking Tylenol. His appetite is fair. No symptom weight loss.   REVIEW  OF SYSTEMS:   ROS: As in HPI above. In addition, no new headaches or focal weakness.  No sore throat, cough, shortness of breath, sputum, hemoptysis or chest pain. No abdominal pain, constipation, diarrhea, dysuria or hematuria.   PAST MEDICAL HISTORY: Reviewed.         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 As above.  FAMILY HISTORY: Reviewed. Remarkable for diabetes, heart disease,  hypertension, breast cancer.   SOCIAL HISTORY: Reviewed. Nonsmoker.  Denies alcohol  or recreational drug usage.    Allergies  Allergen Reactions  . Pollen Extract Other (See Comments)    Runny nose    Current Outpatient Prescriptions  Medication Sig Dispense Refill  . aspirin 81 MG tablet Take 81 mg by mouth daily.    Marland Kitchen atorvastatin (LIPITOR) 80 MG tablet Take 80 mg by mouth daily.    . cloNIDine (CATAPRES) 0.3 MG tablet Take 0.3 mg by mouth 2 (two) times daily.    . clopidogrel (PLAVIX) 75 MG tablet Take 75 mg by mouth daily.    . Dulaglutide (TRULICITY [place]) Inject into the skin.    . fluticasone (FLONASE) 50 MCG/ACT nasal spray Place 1 spray into both nostrils daily.    Marland Kitchen gabapentin (NEURONTIN) 300 MG capsule Take 1 capsule (300 mg total) by mouth 3 (three) times daily. 90 capsule 0  . glipiZIDE (GLUCOTROL) 5 MG tablet Take 2.5 mg by mouth 2 (two) times daily.    . isosorbide mononitrate (IMDUR) 60 MG 24 hr tablet Take 60 mg by mouth daily.    . nitroGLYCERIN (NITROSTAT) 0.4 MG SL tablet Place 0.4 mg under the tongue every 5 (five) minutes as needed for chest pain.    . ONE TOUCH ULTRA TEST test strip use as directed;  twice a day OR MORE if needed  1   No current facility-administered medications for this visit.    PHYSICAL EXAM: Filed Vitals:   05/08/16 0850  BP: 127/66  Pulse: 59  Temp: 94.7 F (34.8 C)  Resp: 18     Body mass index is 26.54 kg/(m^2).     BP 127/66 mmHg  Pulse 59  Temp(Src) 94.7 F (34.8 C) (Tympanic)  Resp 18  Wt 203 lb 14.8 oz (92.5 kg)  General Appearance:    Alert, cooperative, no distress, appears stated age;  accompanied by his wife   Head:    Normocephalic, without obvious abnormality, atraumatic  Eyes:    PERRL, conjunctiva/corneas clear, EOM's intact, fundi    benign, both eyes       Ears:    Normal TM's and external ear canals, both ears  Nose:   Nares normal, septum midline, mucosa normal, no drainage   or sinus tenderness  Throat:    Lips, mucosa, and tongue normal; teeth and gums normal  Neck:   Supple, symmetrical, trachea midline, no adenopathy;       thyroid:  No enlargement/tenderness/nodules; carotid   bruit on L  Back:     Symmetric, no curvature, ROM normal, no CVA tenderness  Lungs:     Clear to auscultation bilaterally, respirations unlabored  Chest wall:    No tenderness or deformity  Heart:    Regular rate and rhythm, S1 and S2 normal, systolic ejection murmur 3 out of 6 on aorta   Abdomen:     Soft, non-tender, bowel sounds active all four quadrants,    no masses, no organomegaly  Extremities:   Extremities normal, atraumatic, no cyanosis or edema  Pulses:   2+ and symmetric all extremities  Skin:   Skin color, texture, turgor normal, no rashes or lesions  Lymph nodes:   Cervical, supraclavicular, and axillary nodes normal  Neurologic:   CNII-XII intact. Normal strength, sensation and reflexes      throughout     LAB RESULTS: Recent Results (from the past 2160 hour(s))  CBC with Differential     Status: Abnormal   Collection Time: 02/11/16  3:25 PM  Result Value Ref Range  WBC 2.1 (L) 3.8 - 10.6 K/uL   RBC 3.44 (L) 4.40 - 5.90 MIL/uL   Hemoglobin 10.6 (L) 13.0 - 18.0 g/dL   HCT 30.6 (L) 40.0 - 52.0 %   MCV 88.9 80.0 - 100.0 fL   MCH 30.9 26.0 - 34.0 pg   MCHC 34.7 32.0 - 36.0 g/dL   RDW 13.4 11.5 - 14.5 %   Platelets 187 150 - 440 K/uL   Neutrophils Relative % 10 %   Neutro Abs 0.2 (L) 1.4 - 6.5 K/uL    Comment: RESULT REPEATED AND VERIFIED CRITICAL RESULT CALLED TO, READ BACK BY AND VERIFIED WITH: [facility] [place] AT 1539 02/11/2016 KMR    Lymphocytes Relative 55 %   Lymphs Abs 1.1 1.0 - 3.6 K/uL   Monocytes Relative 21 %   Monocytes Absolute 0.4 0.2 - 1.0 K/uL   Eosinophils Relative 11 %   Eosinophils Absolute 0.2 0 - 0.7 K/uL   Basophils Relative 3 %   Basophils Absolute 0.1 0 - 0.1 K/uL  CBC with Differential     Status: Abnormal   Collection Time: 02/25/16  2:14 PM  Result Value Ref  Range   WBC 4.0 3.8 - 10.6 K/uL   RBC 3.53 (L) 4.40 - 5.90 MIL/uL   Hemoglobin 10.8 (L) 13.0 - 18.0 g/dL   HCT 30.9 (L) 40.0 - 52.0 %   MCV 87.7 80.0 - 100.0 fL   MCH 30.7 26.0 - 34.0 pg   MCHC 35.0 32.0 - 36.0 g/dL   RDW 13.5 11.5 - 14.5 %   Platelets 162 150 - 440 K/uL   Neutrophils Relative % 63 %   Neutro Abs 2.5 1.4 - 6.5 K/uL   Lymphocytes Relative 24 %   Lymphs Abs 1.0 1.0 - 3.6 K/uL   Monocytes Relative 6 %   Monocytes Absolute 0.2 0.2 - 1.0 K/uL   Eosinophils Relative 5 %   Eosinophils Absolute 0.2 0 - 0.7 K/uL   Basophils Relative 2 %   Basophils Absolute 0.1 0 - 0.1 K/uL  CBC with Differential     Status: Abnormal   Collection Time: 03/12/16  1:28 PM  Result Value Ref Range   WBC 2.0 (L) 3.8 - 10.6 K/uL   RBC 3.77 (L) 4.40 - 5.90 MIL/uL   Hemoglobin 11.6 (L) 13.0 - 18.0 g/dL   HCT 32.7 (L) 40.0 - 52.0 %   MCV 86.8 80.0 - 100.0 fL   MCH 30.7 26.0 - 34.0 pg   MCHC 35.4 32.0 - 36.0 g/dL   RDW 13.4 11.5 - 14.5 %   Platelets 165 150 - 440 K/uL   Neutrophils Relative % 13 %   Neutro Abs 0.3 (L) 1.7 - 7.7 K/uL    Comment: RESULT REPEATED AND VERIFIED CRITICAL RESULT CALLED TO, READ BACK BY AND VERIFIED WITH: ALLYSON PRICE AT 1412 03/12/2016 KMR    Lymphocytes Relative 56 %   Lymphs Abs 1.1 1.0 - 3.6 K/uL   Monocytes Relative 19 %   Monocytes Absolute 0.4 0.2 - 1.0 K/uL   Eosinophils Relative 10 %   Eosinophils Absolute 0.2 0 - 0.7 K/uL   Basophils Relative 2 %   Basophils Absolute 0.0 0 - 0.1 K/uL  CBC with Differential/Platelet     Status: Abnormal   Collection Time: 03/26/16  1:54 PM  Result Value Ref Range   WBC 3.2 (L) 3.8 - 10.6 K/uL   RBC 3.58 (L) 4.40 - 5.90 MIL/uL   Hemoglobin 11.0 (L) 13.0 -  18.0 g/dL   HCT 30.7 (L) 40.0 - 52.0 %   MCV 85.9 80.0 - 100.0 fL   MCH 30.7 26.0 - 34.0 pg   MCHC 35.7 32.0 - 36.0 g/dL   RDW 13.6 11.5 - 14.5 %   Platelets 184 150 - 440 K/uL   Neutrophils Relative % 50 %   Neutro Abs 1.6 1.4 - 6.5 K/uL   Lymphocytes  Relative 35 %   Lymphs Abs 1.1 1.0 - 3.6 K/uL   Monocytes Relative 8 %   Monocytes Absolute 0.2 0.2 - 1.0 K/uL   Eosinophils Relative 5 %   Eosinophils Absolute 0.2 0 - 0.7 K/uL   Basophils Relative 2 %   Basophils Absolute 0.1 0 - 0.1 K/uL  Comprehensive metabolic panel     Status: Abnormal   Collection Time: 03/26/16  1:54 PM  Result Value Ref Range   Sodium 135 135 - 145 mmol/L   Potassium 4.6 3.5 - 5.1 mmol/L   Chloride 108 101 - 111 mmol/L   CO2 24 22 - 32 mmol/L   Glucose, Bld 98 65 - 99 mg/dL   BUN 30 (H) 6 - 20 mg/dL   Creatinine, Ser 1.42 (H) 0.61 - 1.24 mg/dL   Calcium 9.1 8.9 - 10.3 mg/dL   Total Protein 7.1 6.5 - 8.1 g/dL   Albumin 4.4 3.5 - 5.0 g/dL   AST 25 15 - 41 U/L   ALT 19 17 - 63 U/L   Alkaline Phosphatase 82 38 - 126 U/L   Total Bilirubin 0.6 0.3 - 1.2 mg/dL   GFR calc non Af Amer 48 (L) >60 mL/min   GFR calc Af Amer 55 (L) >60 mL/min    Comment: (NOTE) The eGFR has been calculated using the CKD EPI equation. This calculation has not been validated in all clinical situations. eGFR's persistently <60 mL/min signify possible Chronic Kidney Disease.    Anion gap 3 (L) 5 - 15  CBC with Differential     Status: Abnormal   Collection Time: 04/02/16  2:07 PM  Result Value Ref Range   WBC 1.9 (L) 3.8 - 10.6 K/uL   RBC 3.52 (L) 4.40 - 5.90 MIL/uL   Hemoglobin 10.8 (L) 13.0 - 18.0 g/dL   HCT 30.6 (L) 40.0 - 52.0 %   MCV 86.8 80.0 - 100.0 fL   MCH 30.8 26.0 - 34.0 pg   MCHC 35.5 32.0 - 36.0 g/dL   RDW 13.4 11.5 - 14.5 %   Platelets 221 150 - 440 K/uL   Neutrophils Relative % 4 %   Neutro Abs 0.1 (L) 1.7 - 7.7 K/uL    Comment: RESULT REPEATED AND VERIFIED CRITICAL RESULT CALLED TO, READ BACK BY AND VERIFIED WITH: HEATHER JONES AT 1418 04/02/2016 KMR    Lymphocytes Relative 58 %   Lymphs Abs 1.1 1.0 - 3.6 K/uL   Monocytes Relative 26 %   Monocytes Absolute 0.5 0.2 - 1.0 K/uL   Eosinophils Relative 9 %   Eosinophils Absolute 0.2 0 - 0.7 K/uL   Basophils  Relative 3 %   Basophils Absolute 0.1 0 - 0.1 K/uL  CBC with Differential     Status: Abnormal   Collection Time: 04/22/16 10:35 AM  Result Value Ref Range   WBC 2.0 (L) 3.8 - 10.6 K/uL   RBC 3.61 (L) 4.40 - 5.90 MIL/uL   Hemoglobin 10.9 (L) 13.0 - 18.0 g/dL   HCT 31.1 (L) 40.0 - 52.0 %   MCV 86.4 80.0 -  100.0 fL   MCH 30.2 26.0 - 34.0 pg   MCHC 35.0 32.0 - 36.0 g/dL   RDW 13.7 11.5 - 14.5 %   Platelets 189 150 - 440 K/uL   Neutrophils Relative % 5 %   Neutro Abs 0.1 (L) 1.7 - 7.7 K/uL    Comment: RESULT REPEATED AND VERIFIED CRITICAL RESULT CALLED TO, READ BACK BY AND VERIFIED WITH: NYO TO HEATHER JONES 04/22/16 1056    Lymphocytes Relative 57 %   Lymphs Abs 1.2 1.0 - 3.6 K/uL   Monocytes Relative 23 %   Monocytes Absolute 0.5 0.2 - 1.0 K/uL   Eosinophils Relative 11 %   Eosinophils Absolute 0.2 0 - 0.7 K/uL   Basophils Relative 4 %   Basophils Absolute 0.1 0 - 0.1 K/uL  Basic metabolic panel     Status: Abnormal   Collection Time: 04/25/16  8:42 PM  Result Value Ref Range   Sodium 134 (L) 135 - 145 mmol/L   Potassium 4.9 3.5 - 5.1 mmol/L   Chloride 106 101 - 111 mmol/L   CO2 21 (L) 22 - 32 mmol/L   Glucose, Bld 123 (H) 65 - 99 mg/dL   BUN 50 (H) 6 - 20 mg/dL   Creatinine, Ser 2.69 (H) 0.61 - 1.24 mg/dL   Calcium 8.9 8.9 - 10.3 mg/dL   GFR calc non Af Amer 22 (L) >60 mL/min   GFR calc Af Amer 26 (L) >60 mL/min    Comment: (NOTE) The eGFR has been calculated using the CKD EPI equation. This calculation has not been validated in all clinical situations. eGFR's persistently <60 mL/min signify possible Chronic Kidney Disease.    Anion gap 7 5 - 15  CBC     Status: Abnormal   Collection Time: 04/25/16  8:42 PM  Result Value Ref Range   WBC 2.5 (L) 3.8 - 10.6 K/uL   RBC 3.28 (L) 4.40 - 5.90 MIL/uL   Hemoglobin 9.9 (L) 13.0 - 18.0 g/dL   HCT 29.0 (L) 40.0 - 52.0 %   MCV 88.5 80.0 - 100.0 fL   MCH 30.2 26.0 - 34.0 pg   MCHC 34.1 32.0 - 36.0 g/dL   RDW 13.9 11.5 -  14.5 %   Platelets 154 150 - 440 K/uL  Troponin I     Status: None   Collection Time: 04/25/16  8:42 PM  Result Value Ref Range   Troponin I <0.03 <0.031 ng/mL    Comment:        NO INDICATION OF MYOCARDIAL INJURY.   Basic metabolic panel     Status: Abnormal   Collection Time: 04/26/16  4:18 AM  Result Value Ref Range   Sodium 135 135 - 145 mmol/L   Potassium 4.9 3.5 - 5.1 mmol/L   Chloride 109 101 - 111 mmol/L   CO2 19 (L) 22 - 32 mmol/L   Glucose, Bld 106 (H) 65 - 99 mg/dL   BUN 49 (H) 6 - 20 mg/dL   Creatinine, Ser 2.48 (H) 0.61 - 1.24 mg/dL   Calcium 8.6 (L) 8.9 - 10.3 mg/dL   GFR calc non Af Amer 24 (L) >60 mL/min   GFR calc Af Amer 28 (L) >60 mL/min    Comment: (NOTE) The eGFR has been calculated using the CKD EPI equation. This calculation has not been validated in all clinical situations. eGFR's persistently <60 mL/min signify possible Chronic Kidney Disease.    Anion gap 7 5 - 15  CBC  Status: Abnormal   Collection Time: 04/26/16  4:18 AM  Result Value Ref Range   WBC 2.5 (L) 3.8 - 10.6 K/uL   RBC 3.29 (L) 4.40 - 5.90 MIL/uL   Hemoglobin 9.9 (L) 13.0 - 18.0 g/dL   HCT 28.6 (L) 40.0 - 52.0 %   MCV 86.9 80.0 - 100.0 fL   MCH 30.1 26.0 - 34.0 pg   MCHC 34.6 32.0 - 36.0 g/dL   RDW 13.9 11.5 - 14.5 %   Platelets 127 (L) 150 - 440 K/uL  Hemoglobin A1c     Status: Abnormal   Collection Time: 04/26/16  4:18 AM  Result Value Ref Range   Hgb A1c MFr Bld 6.2 (H) 4.0 - 6.0 %  Troponin I     Status: None   Collection Time: 04/26/16  4:18 AM  Result Value Ref Range   Troponin I <0.03 <0.031 ng/mL    Comment:        NO INDICATION OF MYOCARDIAL INJURY.   Glucose, capillary     Status: Abnormal   Collection Time: 04/26/16  5:31 AM  Result Value Ref Range   Glucose-Capillary 110 (H) 65 - 99 mg/dL  Echocardiogram     Status: None   Collection Time: 04/26/16  8:10 AM  Result Value Ref Range   Weight 3361.6 oz   Height 73.5 in   BP 115/35 mmHg  Troponin I      Status: None   Collection Time: 04/26/16  8:46 AM  Result Value Ref Range   Troponin I <0.03 <0.031 ng/mL    Comment:        NO INDICATION OF MYOCARDIAL INJURY.   Glucose, capillary     Status: Abnormal   Collection Time: 04/26/16 11:49 AM  Result Value Ref Range   Glucose-Capillary 207 (H) 65 - 99 mg/dL  Urinalysis complete, with microscopic ([facility] only)     Status: Abnormal   Collection Time: 04/26/16  1:54 PM  Result Value Ref Range   Color, Urine STRAW (A) YELLOW   APPearance CLEAR (A) CLEAR   Glucose, UA NEGATIVE NEGATIVE mg/dL   Bilirubin Urine NEGATIVE NEGATIVE   Ketones, ur NEGATIVE NEGATIVE mg/dL   Specific Gravity, Urine 1.006 1.005 - 1.030   Hgb urine dipstick NEGATIVE NEGATIVE   pH 5.0 5.0 - 8.0   Protein, ur NEGATIVE NEGATIVE mg/dL   Nitrite NEGATIVE NEGATIVE   Leukocytes, UA NEGATIVE NEGATIVE   RBC / HPF NONE SEEN 0 - 5 RBC/hpf   WBC, UA 0-5 0 - 5 WBC/hpf   Bacteria, UA NONE SEEN NONE SEEN   Squamous Epithelial / LPF NONE SEEN NONE SEEN   Mucous PRESENT   Troponin I     Status: Abnormal   Collection Time: 04/26/16  4:26 PM  Result Value Ref Range   Troponin I 0.06 (H) <0.031 ng/mL    Comment: READ BACK AND VERIFIED WITH TAYLOR BECK AT 15 ON 04/26/16 BY [facility]        PERSISTENTLY INCREASED TROPONIN VALUES IN THE RANGE OF 0.04-0.49 ng/mL CAN BE SEEN IN:       -UNSTABLE ANGINA       -CONGESTIVE HEART FAILURE       -MYOCARDITIS       -CHEST TRAUMA       -ARRYHTHMIAS       -LATE PRESENTING MYOCARDIAL INFARCTION       -COPD   CLINICAL FOLLOW-UP RECOMMENDED.   Glucose, capillary     Status: Abnormal  Collection Time: 04/26/16  4:51 PM  Result Value Ref Range   Glucose-Capillary 177 (H) 65 - 99 mg/dL  Glucose, capillary     Status: Abnormal   Collection Time: 04/26/16  9:03 PM  Result Value Ref Range   Glucose-Capillary 143 (H) 65 - 99 mg/dL  Protime-INR     Status: None   Collection Time: 04/27/16  5:52 AM  Result Value Ref Range   Prothrombin Time  14.6 11.4 - 15.0 seconds   INR 6.59   Basic metabolic panel     Status: Abnormal   Collection Time: 04/27/16  5:52 AM  Result Value Ref Range   Sodium 138 135 - 145 mmol/L   Potassium 4.6 3.5 - 5.1 mmol/L   Chloride 111 101 - 111 mmol/L   CO2 22 22 - 32 mmol/L   Glucose, Bld 134 (H) 65 - 99 mg/dL   BUN 45 (H) 6 - 20 mg/dL   Creatinine, Ser 2.17 (H) 0.61 - 1.24 mg/dL   Calcium 8.6 (L) 8.9 - 10.3 mg/dL   GFR calc non Af Amer 29 (L) >60 mL/min   GFR calc Af Amer 33 (L) >60 mL/min    Comment: (NOTE) The eGFR has been calculated using the CKD EPI equation. This calculation has not been validated in all clinical situations. eGFR's persistently <60 mL/min signify possible Chronic Kidney Disease.    Anion gap 5 5 - 15  Glucose, capillary     Status: Abnormal   Collection Time: 04/27/16 11:41 AM  Result Value Ref Range   Glucose-Capillary 105 (H) 65 - 99 mg/dL  Glucose, capillary     Status: Abnormal   Collection Time: 04/27/16  4:13 PM  Result Value Ref Range   Glucose-Capillary 162 (H) 65 - 99 mg/dL   Comment 1 Notify RN    Comment 2 Document in Chart   Glucose, capillary     Status: Abnormal   Collection Time: 04/27/16  9:14 PM  Result Value Ref Range   Glucose-Capillary 121 (H) 65 - 99 mg/dL  CBC with Differential/Platelet     Status: Abnormal   Collection Time: 04/28/16  4:13 AM  Result Value Ref Range   WBC 2.4 (L) 3.8 - 10.6 K/uL   RBC 2.97 (L) 4.40 - 5.90 MIL/uL   Hemoglobin 8.7 (L) 13.0 - 18.0 g/dL   HCT 25.3 (L) 40.0 - 52.0 %   MCV 85.1 80.0 - 100.0 fL   MCH 29.4 26.0 - 34.0 pg   MCHC 34.5 32.0 - 36.0 g/dL   RDW 13.7 11.5 - 14.5 %   Platelets 107 (L) 150 - 440 K/uL   Neutrophils Relative % 47% %   Neutro Abs 1.1 (L) 1.4 - 6.5 K/uL   Lymphocytes Relative 28% %   Lymphs Abs 0.7 (L) 1.0 - 3.6 K/uL   Monocytes Relative 21% %   Monocytes Absolute 0.5 0.2 - 1.0 K/uL   Eosinophils Relative 3% %   Eosinophils Absolute 0.1 0 - 0.7 K/uL   Basophils Relative 1% %    Basophils Absolute 0.0 0 - 0.1 K/uL  Basic metabolic panel     Status: Abnormal   Collection Time: 04/28/16  4:13 AM  Result Value Ref Range   Sodium 140 135 - 145 mmol/L   Potassium 4.8 3.5 - 5.1 mmol/L   Chloride 112 (H) 101 - 111 mmol/L   CO2 23 22 - 32 mmol/L   Glucose, Bld 128 (H) 65 - 99 mg/dL   BUN 35 (  H) 6 - 20 mg/dL   Creatinine, Ser 1.91 (H) 0.61 - 1.24 mg/dL   Calcium 8.6 (L) 8.9 - 10.3 mg/dL   GFR calc non Af Amer 33 (L) >60 mL/min   GFR calc Af Amer 39 (L) >60 mL/min    Comment: (NOTE) The eGFR has been calculated using the CKD EPI equation. This calculation has not been validated in all clinical situations. eGFR's persistently <60 mL/min signify possible Chronic Kidney Disease.    Anion gap 5 5 - 15  Glucose, capillary     Status: Abnormal   Collection Time: 04/28/16  7:44 AM  Result Value Ref Range   Glucose-Capillary 115 (H) 65 - 99 mg/dL  Glucose, capillary     Status: Abnormal   Collection Time: 04/28/16 11:35 AM  Result Value Ref Range   Glucose-Capillary 173 (H) 65 - 99 mg/dL     STUDIES: Dg Chest 2 View  04/25/2016  CLINICAL DATA:  Chest pain EXAM: CHEST  2 VIEW COMPARISON:  None. FINDINGS: Normal heart size. Normal mediastinal contour. No pneumothorax. No pleural effusion. Lungs appear clear, with no acute consolidative airspace disease and no pulmonary edema. IMPRESSION: No active cardiopulmonary disease. Electronically Signed   By: Ilona Sorrel M.D.   On: 04/25/2016 21:07   Mr Angiogram Neck Wo Contrast  04/27/2016  CLINICAL DATA:  75 year old hypertensive diabetic male with slurred speech and ataxia. Subsequent encounter. EXAM: MRA NECK WITHOUT CONTRAST TECHNIQUE: Multiplanar and multiecho pulse sequences of the neck were obtained without intravenous contrast. Angiographic images of the neck were obtained using MRA technique without and with intravenous contrast. COMPARISON:  MR brain 04/26/2016. Carotid duplex examination 04/26/2016. FINDINGS: Patient  has an elevated creatinine and therefore MR angiogram performed without contrast and CT angiogram not ordered. The present noncontrast examination was tailored to specifically evaluate the carotid bifurcation. The entire course of the carotid arteries and vertebral arteries were not imaged. Right carotid bifurcation: Irregularity of the of proximal right internal carotid artery. Small ulceration may be present. Just beyond this region, 50% diameter stenosis of the right internal carotid artery 1.5 cm above its origin. Ectatic mid to distal cervical segment of the right internal carotid artery. Mild narrowing proximal right external carotid. Left carotid bifurcation: Mild irregularity of the left internal carotid artery without measurable stenosis. 50% diameter stenosis proximal left external carotid artery. Artifact extends through portion of the vertebral artery greater on the right. Vertebral arteries are of similar caliber. Mild ectasia and and irregularity of the vertebral arteries at the C1 -2 level. IMPRESSION: Patient has an elevated creatinine and therefore MR angiogram performed without contrast and CT angiogram not ordered. The present noncontrast examination was tailored to specifically evaluate the carotid bifurcation. The entire course of the carotid arteries and vertebral arteries were not imaged. Right carotid bifurcation: Irregularity of the of proximal right internal carotid artery. Small ulceration may be present. Just beyond this region, 50% diameter stenosis of the right internal carotid artery 1.5 cm above its origin. Ectatic mid to distal cervical segment of the right internal carotid artery. Left carotid bifurcation: Mild irregularity of the left internal carotid artery without measurable stenosis. Vertebral arteries are of similar caliber. Mild ectasia and and irregularity of the vertebral arteries at the C1 -2 level. Electronically Signed   By: Genia Del M.D.   On: 04/27/2016 14:30    Mr Brain Wo Contrast  04/26/2016  CLINICAL DATA:  Coronary artery disease in acute renal insufficiency with episode of word-finding difficulty and  slurred speech today. Also some ataxia. EXAM: MRI HEAD WITHOUT CONTRAST TECHNIQUE: Multiplanar, multiecho pulse sequences of the brain and surrounding structures were obtained without intravenous contrast. COMPARISON:  Carotid Doppler ultrasound study from the same day. FINDINGS: The diffusion-weighted images demonstrate no evidence for acute or subacute infarction. Acute hemorrhage or mass lesion is present. The ventricles are of normal size. Mild periventricular and scattered subcortical T2 changes are within normal limits for age. Minimal white matter changes extend into the brainstem. Internal auditory canals are within normal limits bilaterally. The cerebellum is unremarkable. Flow is present in the major intracranial arteries. The globes and orbits are intact. Polyps or mucous retention cysts are present in the maxillary sinuses bilaterally. There is some mucosal thickening within the anterior ethmoid air cells and frontal sinuses bilaterally. Bilateral mastoid effusions are present. No obstructing nasopharyngeal lesion is evident. The skullbase is within normal limits. Midline sagittal images demonstrate no significant intracranial lesions. Mild degenerative changes are noted in the upper cervical spine. IMPRESSION: 1. No evidence for acute or subacute infarction. 2. Normal MRI appearance the brain for age. 3. Minimal sinus disease. Electronically Signed   By: San Morelle M.D.   On: 04/26/2016 15:49   US Renal  04/26/2016  CLINICAL DATA:  Acute renal failure EXAM: RENAL / URINARY TRACT ULTRASOUND COMPLETE COMPARISON:  None. FINDINGS: Right Kidney: Length: 11.3 cm. Mild prominence the right renal pelvis on pre and postvoid images. No caliectasis. The right kidney is otherwise normal. Left Kidney: Length: 14.1 cm. Echogenicity within normal limits.  No mass or hydronephrosis visualized. Bladder: Appears normal for degree of bladder distention. IMPRESSION: 1. Mild age indeterminate prominence of the right renal pelvis with no caliectasis. The kidneys are otherwise normal in appearance. Electronically Signed   By: Dorise Bullion III M.D   On: 04/26/2016 11:32   US Carotid Bilateral  04/26/2016  CLINICAL DATA:  Syncopal episode, visual disturbance and TIA. History of hypertension, CAD (post coronary artery stent placement) and diabetes. EXAM: BILATERAL CAROTID DUPLEX ULTRASOUND TECHNIQUE: Pearline Cables scale imaging, color Doppler and duplex ultrasound were performed of bilateral carotid and vertebral arteries in the neck. COMPARISON:  None. FINDINGS: Criteria: Quantification of carotid stenosis is based on velocity parameters that correlate the residual internal carotid diameter with NASCET-based stenosis levels, using the diameter of the distal internal carotid lumen as the denominator for stenosis measurement. The following velocity measurements were obtained: RIGHT ICA:  211/58 cm/sec CCA:  706/23 cm/sec SYSTOLIC ICA/CCA RATIO:  1.8 DIASTOLIC ICA/CCA RATIO:  2.4 ECA:  157 cm/sec LEFT ICA:  154/56 cm/sec CCA:  762/83 cm/sec SYSTOLIC ICA/CCA RATIO:  1.4 DIASTOLIC ICA/CCA RATIO:  1.6 ECA:  143 cm/sec RIGHT CAROTID ARTERY: There is a moderate to large amount of eccentric mixed echogenic plaque within the right carotid bulb (representative images 15 and 16), extending to involve the origin and proximal aspect the right internal carotid artery (is 23) which results in elevated peak systolic velocities within the mid and distal aspects of the right internal carotid artery (greatest acquired peak systolic velocity within mid aspect the right ICA measures 211 cm/sec - image 29) RIGHT VERTEBRAL ARTERY:  Antegrade Flow LEFT CAROTID ARTERY: There is a minimal amount of eccentric mixed echogenic plaque within the mid aspect the left common carotid artery (image 41). There is a  large amount of eccentric mixed echogenic plaque within the left carotid bulb (images 49 and 52), extending to involve the origin and proximal aspects of the left internal carotid artery (image 56,  resulting in elevated peak systolic velocities within the mid and distal aspect of the left internal carotid artery (greatest acquired peak systolic velocity within the distal aspect of the left ICA measures 154 cm/sec - image 64). LEFT VERTEBRAL ARTERY:  Antegrade Flow IMPRESSION: Moderate to large amount of bilateral atherosclerotic plaque results in elevated peak systolic velocities within the bilateral internal carotid arteries compatible with the higher end of the 50-69% luminal narrowing range bilaterally, right greater than left. Further evaluation with CTA could performed as clinically indicated. Electronically Signed   By: Sandi Mariscal M.D.   On: 04/26/2016 12:45   07/23/15 -  Bone Marrow Biopsy Report  -  nonspecific marrow findings with no diagnostic morphologic or immunophenotypic evidence of hematopoietic neoplasia. Mildly hypercellular marrow for age (40-50%) with myeloid hypoplasia, increased eosinophils, erythroid hyperplasia with mild nonspecific dyserythropoiesis and mild megakaryocytic atypia. Diffuse mild to focally moderate increase in reticulin. Storage iron present. Flow cytometry reports relatively decreased neutrophilic cells with no immunophenotypic abnormalities or increase in blasts, relatively increased eosinophils (17%) nonspecific. Cytogenetics normal 46XY. SNP microarray result is Normal Male.    ASSESSMENT / PLAN:   1. Leukopenia/Neutropenia  of unclear origin-as previously stated, extensive workup including multiple bone marrow biopsies failed to reveal clear etiology of persistent neutropenia.  Currently on granix  2  Every 3 weeks.   # Profuse night sweats/unexplained fevers- question related to underlying malignancy- especially lymphoma.  Recommend CT chest and pelvis  noncontrast-given recent renal failure.  # Worsening anemia hemoglobin around 9/platelets dropping from 107- await repeat blood counts next week check CBC CMP and LDH and iron studiesferritin.  # Patient will follow-up with me in approximately week; patient is due to get his Granix next week/on the eighth.   # Recent cardiac catheter status post stenting; acute renal failure/CKD.   Cammie Sickle, MD   05/08/2016 12:45 PM

## 2016-05-08 NOTE — Progress Notes (Signed)
Patient here today as hospital follow up and to discuss night sweats.  Patient states night sweats have only been a couple of times and he had fever both times while in the hospital.  The few times he had them at home he did not check temp.

## 2016-05-11 ENCOUNTER — Ambulatory Visit: Admission: RE | Admit: 2016-05-11 | Payer: Medicare HMO | Source: Ambulatory Visit

## 2016-05-13 ENCOUNTER — Ambulatory Visit: Payer: Medicare HMO

## 2016-05-14 ENCOUNTER — Inpatient Hospital Stay: Payer: Medicare HMO

## 2016-05-14 VITALS — BP 159/71 | HR 63 | Temp 96.7°F

## 2016-05-14 DIAGNOSIS — D708 Other neutropenia: Secondary | ICD-10-CM

## 2016-05-14 DIAGNOSIS — R61 Generalized hyperhidrosis: Secondary | ICD-10-CM

## 2016-05-14 DIAGNOSIS — R509 Fever, unspecified: Secondary | ICD-10-CM

## 2016-05-14 LAB — COMPREHENSIVE METABOLIC PANEL
ALBUMIN: 4.2 g/dL (ref 3.5–5.0)
ALT: 13 U/L — ABNORMAL LOW (ref 17–63)
AST: 19 U/L (ref 15–41)
Alkaline Phosphatase: 89 U/L (ref 38–126)
Anion gap: 7 (ref 5–15)
BUN: 36 mg/dL — AB (ref 6–20)
CHLORIDE: 106 mmol/L (ref 101–111)
CO2: 22 mmol/L (ref 22–32)
CREATININE: 1.92 mg/dL — AB (ref 0.61–1.24)
Calcium: 9 mg/dL (ref 8.9–10.3)
GFR calc Af Amer: 39 mL/min — ABNORMAL LOW (ref >60)
GFR, EST NON AFRICAN AMERICAN: 33 mL/min — AB (ref >60)
Glucose, Bld: 192 mg/dL — ABNORMAL HIGH (ref 65–99)
POTASSIUM: 4.4 mmol/L (ref 3.5–5.1)
SODIUM: 135 mmol/L (ref 135–145)
Total Bilirubin: 0.6 mg/dL (ref 0.3–1.2)
Total Protein: 7.2 g/dL (ref 6.5–8.1)

## 2016-05-14 LAB — CBC WITH DIFFERENTIAL/PLATELET
BASOS ABS: 0.1 10*3/uL (ref 0–0.1)
BASOS PCT: 4 %
EOS ABS: 0.2 10*3/uL (ref 0–0.7)
EOS PCT: 12 %
HEMATOCRIT: 31.3 % — AB (ref 40.0–52.0)
Hemoglobin: 11 g/dL — ABNORMAL LOW (ref 13.0–18.0)
Lymphocytes Relative: 55 %
Lymphs Abs: 0.9 10*3/uL — ABNORMAL LOW (ref 1.0–3.6)
MCH: 30.4 pg (ref 26.0–34.0)
MCHC: 35.3 g/dL (ref 32.0–36.0)
MCV: 86.2 fL (ref 80.0–100.0)
MONO ABS: 0.4 10*3/uL (ref 0.2–1.0)
Monocytes Relative: 24 %
NEUTROS ABS: 0.1 10*3/uL — AB (ref 1.7–7.7)
Neutrophils Relative %: 5 %
PLATELETS: 206 10*3/uL (ref 150–440)
RBC: 3.63 MIL/uL — ABNORMAL LOW (ref 4.40–5.90)
RDW: 13.8 % (ref 11.5–14.5)
WBC: 1.7 10*3/uL — ABNORMAL LOW (ref 3.8–10.6)

## 2016-05-14 LAB — LACTATE DEHYDROGENASE: LDH: 156 U/L (ref 98–192)

## 2016-05-14 LAB — FERRITIN: Ferritin: 55 ng/mL (ref 24–336)

## 2016-05-14 LAB — IRON AND TIBC
Iron: 60 ug/dL (ref 45–182)
Saturation Ratios: 17 % — ABNORMAL LOW (ref 17.9–39.5)
TIBC: 356 ug/dL (ref 250–450)
UIBC: 296 ug/dL

## 2016-05-14 MED ORDER — TBO-FILGRASTIM 480 MCG/0.8ML ~~LOC~~ SOSY
480.0000 ug | PREFILLED_SYRINGE | Freq: Once | SUBCUTANEOUS | Status: AC
  Administered 2016-05-14: 480 ug via SUBCUTANEOUS
  Filled 2016-05-14: qty 0.8

## 2016-05-15 ENCOUNTER — Ambulatory Visit
Admission: RE | Admit: 2016-05-15 | Discharge: 2016-05-15 | Disposition: A | Discharge status: Home / Self Care | Payer: Medicare HMO | Source: Ambulatory Visit | Attending: Internal Medicine | Admitting: Internal Medicine

## 2016-05-15 ENCOUNTER — Inpatient Hospital Stay: Payer: Medicare HMO

## 2016-05-15 DIAGNOSIS — I700 Atherosclerosis of aorta: Secondary | ICD-10-CM | POA: Insufficient documentation

## 2016-05-15 DIAGNOSIS — R918 Other nonspecific abnormal finding of lung field: Secondary | ICD-10-CM | POA: Diagnosis not present

## 2016-05-15 DIAGNOSIS — I2510 Atherosclerotic heart disease of native coronary artery without angina pectoris: Secondary | ICD-10-CM | POA: Diagnosis not present

## 2016-05-15 DIAGNOSIS — I77810 Thoracic aortic ectasia: Secondary | ICD-10-CM | POA: Insufficient documentation

## 2016-05-15 DIAGNOSIS — R509 Fever, unspecified: Secondary | ICD-10-CM

## 2016-05-15 DIAGNOSIS — D708 Other neutropenia: Secondary | ICD-10-CM

## 2016-05-15 DIAGNOSIS — R61 Generalized hyperhidrosis: Secondary | ICD-10-CM

## 2016-05-15 MED ORDER — TBO-FILGRASTIM 480 MCG/0.8ML ~~LOC~~ SOSY
480.0000 ug | PREFILLED_SYRINGE | Freq: Once | SUBCUTANEOUS | Status: AC
  Administered 2016-05-15: 480 ug via SUBCUTANEOUS
  Filled 2016-05-15: qty 0.8

## 2016-05-19 ENCOUNTER — Other Ambulatory Visit: Payer: Self-pay | Admitting: *Deleted

## 2016-05-19 ENCOUNTER — Telehealth: Payer: Self-pay | Admitting: Internal Medicine

## 2016-05-19 ENCOUNTER — Other Ambulatory Visit: Payer: Self-pay | Admitting: Internal Medicine

## 2016-05-19 DIAGNOSIS — D708 Other neutropenia: Secondary | ICD-10-CM

## 2016-05-19 NOTE — Telephone Encounter (Signed)
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 Welton FlakesKhan; cardiology; reminded the patient to inform cardiology.  # Night sweats resolved.-No evidence of any lymphoma like process on the noncontrast CT scan.  # Mild anemia/iron deficiency- secondary to CKD. Recommend by mouth iron once a day. Repeat checking iron studies/ferritin in 4 months.

## 2016-06-04 ENCOUNTER — Inpatient Hospital Stay: Payer: Medicare HMO

## 2016-06-04 DIAGNOSIS — D708 Other neutropenia: Secondary | ICD-10-CM

## 2016-06-04 LAB — CBC WITH DIFFERENTIAL/PLATELET
Basophils Absolute: 0.1 10*3/uL (ref 0–0.1)
Basophils Relative: 2 %
EOS ABS: 0.1 10*3/uL (ref 0–0.7)
EOS PCT: 6 %
HCT: 29.4 % — ABNORMAL LOW (ref 40.0–52.0)
Hemoglobin: 10.3 g/dL — ABNORMAL LOW (ref 13.0–18.0)
LYMPHS ABS: 1 10*3/uL (ref 1.0–3.6)
LYMPHS PCT: 45 %
MCH: 30.2 pg (ref 26.0–34.0)
MCHC: 35.1 g/dL (ref 32.0–36.0)
MCV: 86.1 fL (ref 80.0–100.0)
MONO ABS: 0.8 10*3/uL (ref 0.2–1.0)
MONOS PCT: 36 %
Neutro Abs: 0.2 10*3/uL — ABNORMAL LOW (ref 1.7–7.7)
Neutrophils Relative %: 11 %
PLATELETS: 210 10*3/uL (ref 150–440)
RBC: 3.41 MIL/uL — ABNORMAL LOW (ref 4.40–5.90)
RDW: 13.7 % (ref 11.5–14.5)
WBC: 2.2 10*3/uL — AB (ref 3.8–10.6)

## 2016-06-04 MED ORDER — TBO-FILGRASTIM 480 MCG/0.8ML ~~LOC~~ SOSY
480.0000 ug | PREFILLED_SYRINGE | Freq: Once | SUBCUTANEOUS | Status: AC
  Administered 2016-06-04: 480 ug via SUBCUTANEOUS
  Filled 2016-06-04: qty 0.8

## 2016-06-05 ENCOUNTER — Inpatient Hospital Stay: Payer: Medicare HMO

## 2016-06-05 DIAGNOSIS — D708 Other neutropenia: Secondary | ICD-10-CM | POA: Diagnosis not present

## 2016-06-05 MED ORDER — TBO-FILGRASTIM 480 MCG/0.8ML ~~LOC~~ SOSY
480.0000 ug | PREFILLED_SYRINGE | Freq: Once | SUBCUTANEOUS | Status: AC
  Administered 2016-06-05: 480 ug via SUBCUTANEOUS
  Filled 2016-06-05: qty 0.8

## 2016-06-10 ENCOUNTER — Encounter: Payer: Medicare HMO | Attending: Cardiovascular Disease | Admitting: *Deleted

## 2016-06-10 ENCOUNTER — Telehealth: Payer: Self-pay | Admitting: *Deleted

## 2016-06-10 VITALS — Ht 72.4 in | Wt 201.8 lb

## 2016-06-10 DIAGNOSIS — Z955 Presence of coronary angioplasty implant and graft: Secondary | ICD-10-CM | POA: Insufficient documentation

## 2016-06-10 NOTE — Progress Notes (Signed)
Cardiac Individual Treatment Plan  Patient Details  Name: Brandon Wilkerson MRN: [id number] Date of Birth: 1941/11/27 Referring Provider:        Cardiac Rehab from 06/10/2016 in [facility] Cardiac and Pulmonary Rehab   Referring Provider  Humphrey Rolls      Initial Encounter Date:       Cardiac Rehab from 06/10/2016 in [facility] Cardiac and Pulmonary Rehab   Date  06/10/16   Referring Provider  Humphrey Rolls      Visit Diagnosis: Status post coronary artery stent placement  Patient's Home Medications on Admission:  Current outpatient prescriptions:  .  aspirin 81 MG tablet, Take 81 mg by mouth daily., Disp: , Rfl:  .  atorvastatin (LIPITOR) 80 MG tablet, Take 80 mg by mouth daily., Disp: , Rfl:  .  cloNIDine (CATAPRES) 0.3 MG tablet, Take 0.3 mg by mouth 2 (two) times daily., Disp: , Rfl:  .  clopidogrel (PLAVIX) 75 MG tablet, Take 75 mg by mouth daily., Disp: , Rfl:  .  Dulaglutide (TRULICITY [place]), Inject into the skin., Disp: , Rfl:  .  fluticasone (FLONASE) 50 MCG/ACT nasal spray, Place 1 spray into both nostrils daily., Disp: , Rfl:  .  gabapentin (NEURONTIN) 300 MG capsule, Take 1 capsule (300 mg total) by mouth 3 (three) times daily., Disp: 90 capsule, Rfl: 0 .  glipiZIDE (GLUCOTROL) 5 MG tablet, Take 2.5 mg by mouth 2 (two) times daily., Disp: , Rfl:  .  isosorbide mononitrate (IMDUR) 60 MG 24 hr tablet, Take 60 mg by mouth daily., Disp: , Rfl:  .  nitroGLYCERIN (NITROSTAT) 0.4 MG SL tablet, Place 0.4 mg under the tongue every 5 (five) minutes as needed for chest pain., Disp: , Rfl:  .  ONE TOUCH ULTRA TEST test strip, use as directed;  twice a day OR MORE if needed, Disp: , Rfl: 1  Past Medical History: Past Medical History  Diagnosis Date  . Hypertension   . PVD (peripheral vascular disease) ([place])   . Diabetes mellitus without complication ([place])   . Arthritis   . Rheumatic aortic stenosis   . Sleep apnea   . Chronic leukopenia     Tobacco Use: History  Smoking status  . Former Smoker  .  Types: Cigarettes  . Quit date: 11/22/1979  Smokeless tobacco  . Never Used    Labs: Recent Review Flowsheet Data    Labs for ITP Cardiac and Pulmonary Rehab Latest Ref Rng 04/26/2016   Hemoglobin A1c 4.0 - 6.0 % 6.2(H)       Exercise Target Goals: Date: 06/10/16  Exercise Program Goal: Individual exercise prescription set with THRR, safety & activity barriers. Participant demonstrates ability to understand and report RPE using BORG scale, to self-measure pulse accurately, and to acknowledge the importance of the exercise prescription.  Exercise Prescription Goal: Starting with aerobic activity 30 plus minutes a day, 3 days per week for initial exercise prescription. Provide home exercise prescription and guidelines that participant acknowledges understanding prior to discharge.  Activity Barriers & Risk Stratification:     Activity Barriers & Cardiac Risk Stratification - 06/10/16 1309    Activity Barriers & Cardiac Risk Stratification   Activity Barriers None   Cardiac Risk Stratification Moderate      6 Minute Walk:     6 Minute Walk      06/10/16 1230       6 Minute Walk   Phase Initial     Distance 1545 feet     Walk Time 6  minutes     # of Rest Breaks 0     MPH 2.9     METS 3.5     RPE 13     VO2 Peak 12.3     Symptoms No     Resting HR 70 bpm     Resting BP 132/80 mmHg     Max Ex. HR 95 bpm     Max Ex. BP 164/60 mmHg        Initial Exercise Prescription:     Initial Exercise Prescription - 06/10/16 1200    Date of Initial Exercise RX and Referring Provider   Date 06/10/16   Referring Provider Humphrey Rolls   Treadmill   MPH 2.5   Grade 1   Minutes 15   METs 3.26   Bike   Level 2   Watts 30   Minutes 15   METs 3   Recumbant Bike   Level 2   RPM 60   Watts 30   Minutes 15   METs 3   NuStep   Level 3   Watts 60   Minutes 15   METs 3.2   Recumbant Elliptical   Level 3   RPM 60   Watts 60   Minutes 15   METs 3.2   Elliptical   Level  1   Speed 50   Minutes 15   METs 3   REL-XR   Level 3   Watts 50   Minutes 15   METs 3   T5 Nustep   Level 3   Watts 50   Minutes 15   METs 3   Biostep-RELP   Level 3   Watts 60   Minutes 15   METs 3.2   Prescription Details   Frequency (times per week) 3   Duration Progress to 30 minutes of continuous aerobic without signs/symptoms of physical distress   Intensity   THRR 40-80% of Max Heartrate 101-132   Ratings of Perceived Exertion 11-15   Perceived Dyspnea 0-4   Progression   Progression Continue to progress workloads to maintain intensity without signs/symptoms of physical distress.   Resistance Training   Training Prescription Yes   Weight 3   Reps 10-15      Perform Capillary Blood Glucose checks as needed.  Exercise Prescription Changes:   Exercise Comments:   Discharge Exercise Prescription (Final Exercise Prescription Changes):   Nutrition:  Target Goals: Understanding of nutrition guidelines, daily intake of sodium <1521m, cholesterol <2055m calories 30% from fat and 7% or less from saturated fats, daily to have 5 or more servings of fruits and vegetables.  Biometrics:     Pre Biometrics - 06/10/16 1228    Pre Biometrics   Height 6' 0.4" (1.839 m)   Weight 201 lb 12.8 oz (91.536 kg)   Waist Circumference 40.5 inches   Hip Circumference 42 inches   Waist to Hip Ratio 0.96 %   BMI (Calculated) 27.1   Single Leg Stand 30 seconds       Nutrition Therapy Plan and Nutrition Goals:   Nutrition Discharge: Rate Your Plate Scores:   Nutrition Goals Re-Evaluation:   Psychosocial: Target Goals: Acknowledge presence or absence of depression, maximize coping skills, provide positive support system. Participant is able to verbalize types and ability to use techniques and skills needed for reducing stress and depression.  Initial Review & Psychosocial Screening:     Initial Psych Review & Screening - 06/10/16 1306    Family Dynamics  Good Support System? Yes   Barriers   Psychosocial barriers to participate in program There are no identifiable barriers or psychosocial needs.   Screening Interventions   Interventions Encouraged to exercise      Quality of Life Scores:     Quality of Life - 06/10/16 1303    Quality of Life Scores   Health/Function Pre 25.71 %   Socioeconomic Pre 30 %   Psych/Spiritual Pre 30 %   Family Pre 30 %   GLOBAL Pre 28.13 %      PHQ-9:     Recent Review Flowsheet Data    Depression screen [facility] 2/9 06/10/2016   Decreased Interest 1   Down, Depressed, Hopeless 0   PHQ - 2 Score 1   Altered sleeping 0   Tired, decreased energy 1   Change in appetite 0   Feeling bad or failure about yourself  0   Trouble concentrating 0   Moving slowly or fidgety/restless 0   Suicidal thoughts 0   PHQ-9 Score 2   Difficult doing work/chores Somewhat difficult      Psychosocial Evaluation and Intervention:   Psychosocial Re-Evaluation:   Vocational Rehabilitation: Provide vocational rehab assistance to qualifying candidates.   Vocational Rehab Evaluation & Intervention:     Vocational Rehab - 06/10/16 1307    Initial Vocational Rehab Evaluation & Intervention   Assessment shows need for Vocational Rehabilitation No      Education: Education Goals: Education classes will be provided on a weekly basis, covering required topics. Participant will state understanding/return demonstration of topics presented.  Learning Barriers/Preferences:     Learning Barriers/Preferences - 06/10/16 1309    Learning Barriers/Preferences   Learning Barriers None   Learning Preferences None      Education Topics: General Nutrition Guidelines/Fats and Fiber: -Group instruction provided by verbal, written material, models and posters to present the general guidelines for heart healthy nutrition. Gives an explanation and review of dietary fats and fiber.   Controlling Sodium/Reading Food  Labels: -Group verbal and written material supporting the discussion of sodium use in heart healthy nutrition. Review and explanation with models, verbal and written materials for utilization of the food label.   Exercise Physiology & Risk Factors: - Group verbal and written instruction with models to review the exercise physiology of the cardiovascular system and associated critical values. Details cardiovascular disease risk factors and the goals associated with each risk factor.   Aerobic Exercise & Resistance Training: - Gives group verbal and written discussion on the health impact of inactivity. On the components of aerobic and resistive training programs and the benefits of this training and how to safely progress through these programs.   Flexibility, Balance, General Exercise Guidelines: - Provides group verbal and written instruction on the benefits of flexibility and balance training programs. Provides general exercise guidelines with specific guidelines to those with heart or lung disease. Demonstration and skill practice provided.   Stress Management: - Provides group verbal and written instruction about the health risks of elevated stress, cause of high stress, and healthy ways to reduce stress.   Depression: - Provides group verbal and written instruction on the correlation between heart/lung disease and depressed mood, treatment options, and the stigmas associated with seeking treatment.   Anatomy & Physiology of the Heart: - Group verbal and written instruction and models provide basic cardiac anatomy and physiology, with the coronary electrical and arterial systems. Review of: AMI, Angina, Valve disease, Heart Failure, Cardiac Arrhythmia, Pacemakers, and the  ICD.   Cardiac Procedures: - Group verbal and written instruction and models to describe the testing methods done to diagnose heart disease. Reviews the outcomes of the test results. Describes the treatment choices:  Medical Management, Angioplasty, or Coronary Bypass Surgery.   Cardiac Medications: - Group verbal and written instruction to review commonly prescribed medications for heart disease. Reviews the medication, class of the drug, and side effects. Includes the steps to properly store meds and maintain the prescription regimen.   Go Sex-Intimacy & Heart Disease, Get SMART - Goal Setting: - Group verbal and written instruction through game format to discuss heart disease and the return to sexual intimacy. Provides group verbal and written material to discuss and apply goal setting through the application of the S.M.A.R.T. Method.   Other Matters of the Heart: - Provides group verbal, written materials and models to describe Heart Failure, Angina, Valve Disease, and Diabetes in the realm of heart disease. Includes description of the disease process and treatment options available to the cardiac patient.   Exercise & Equipment Safety: - Individual verbal instruction and demonstration of equipment use and safety with use of the equipment.          Cardiac Rehab from 06/10/2016 in [facility] Cardiac and Pulmonary Rehab   Date  06/10/16   Educator  C.EnterkinRN   Instruction Review Code  1- partially meets, needs review/practice      Infection Prevention: - Provides verbal and written material to individual with discussion of infection control including proper hand washing and proper equipment cleaning during exercise session.      Cardiac Rehab from 06/10/2016 in [facility] Cardiac and Pulmonary Rehab   Date  06/10/16   Educator  C. EnterkinRN [facility] 0.1so reinforced to use wipes to clean exercise equipmen]   Instruction Review Code  2- meets goals/outcomes      Falls Prevention: - Provides verbal and written material to individual with discussion of falls prevention and safety.      Cardiac Rehab from 06/10/2016 in [facility] Cardiac and Pulmonary Rehab   Date  06/10/16   Educator  C. [place]   Instruction  Review Code  2- meets goals/outcomes      Diabetes: - Individual verbal and written instruction to review signs/symptoms of diabetes, desired ranges of glucose level fasting, after meals and with exercise. Advice that pre and post exercise glucose checks will be done for 3 sessions at entry of program.      Cardiac Rehab from 06/10/2016 in [facility] Cardiac and Pulmonary Rehab   Date  06/10/16   Educator  C. [place]   Instruction Review Code  1- partially meets, needs review/practice       Knowledge Questionnaire Score:     Knowledge Questionnaire Score - 06/10/16 1307    Knowledge Questionnaire Score   Pre Score 19      Core Components/Risk Factors/Patient Goals at Admission:     Personal Goals and Risk Factors at Admission - 06/10/16 1305    Core Components/Risk Factors/Patient Goals on Admission   Sedentary Yes  Abdirizak said he does walk some.    Intervention Provide advice, education, support and counseling about physical activity/exercise needs.;Develop an individualized exercise prescription for aerobic and resistive training based on initial evaluation findings, risk stratification, comorbidities and participant's personal goals.   Expected Outcomes Achievement of increased cardiorespiratory fitness and enhanced flexibility, muscular endurance and strength shown through measurements of functional capacity and personal statement of participant.   Diabetes Yes   Intervention Provide  education about signs/symptoms and action to take for hypo/hyperglycemia.;Provide education about proper nutrition, including hydration, and aerobic/resistive exercise prescription along with prescribed medications to achieve blood glucose in normal ranges: Fasting glucose 65-99 mg/dL   Expected Outcomes Short Term: Participant verbalizes understanding of the signs/symptoms and immediate care of hyper/hypoglycemia, proper foot care and importance of medication, aerobic/resistive exercise and nutrition  plan for blood glucose control.;Long Term: Attainment of HbA1C < 7%.   Hypertension Yes   Intervention Provide education on lifestyle modifcations including regular physical activity/exercise, weight management, moderate sodium restriction and increased consumption of fresh fruit, vegetables, and low fat dairy, alcohol moderation, and smoking cessation.;Monitor prescription use compliance.   Expected Outcomes Short Term: Continued assessment and intervention until BP is < 140/75m HG in hypertensive participants. < 130/859mHG in hypertensive participants with diabetes, heart failure or chronic kidney disease.;Long Term: Maintenance of blood pressure at goal levels.      Core Components/Risk Factors/Patient Goals Review:    Core Components/Risk Factors/Patient Goals at Discharge (Final Review):    ITP Comments:     ITP Comments      06/10/16 1310           ITP Comments [place] for each Cardiac REhab visit. Reinforced for Lenny to clean his exercise equipment before use since he reports his WBC has been 0.1 and he is at risk for infections. LeJulianaid he will talk to the Cardiac REhab registered dietician since it has been about 16 years since he met with one.           Comments: LeMaximill start Cardiac Rehab on Friday.

## 2016-06-10 NOTE — Progress Notes (Signed)
Daily Session Note  Patient Details  Name: JAYLEON MCFARLANE MRN: [id number] Date of Birth: [birth year] Referring Provider:        Cardiac Rehab from 06/10/2016 in [facility] Cardiac and Pulmonary Rehab   Referring Provider  Dorthula Perfect Date: 06/10/2016  Check In:     Session Check In - 06/10/16 1303    Check-In   Location [facility]-Cardiac & Pulmonary Rehab   Staff Present Nada Maclachlan, BA, ACSM CEP, Exercise Physiologist;Nicolas Sisler, RN, BSN   Supervising physician immediately available to respond to emergencies See telemetry face sheet for immediately available ER MD   Medication changes reported     No   Fall or balance concerns reported    No   Warm-up and Cool-down Not performed (comment)  6 minute walk test done in hallway   Resistance Training Performed No   VAD Patient? No   Pain Assessment   Currently in Pain? No/denies         Goals Met:  Personal goals reviewed  Goals Unmet:  Not Applicable  Comments:     Dr. Emily Filbert is Medical Director for [place] and [facility].

## 2016-06-10 NOTE — Patient Instructions (Signed)
Patient Instructions  Patient Details  Name: Brandon Wilkerson MRN: [id number] Date of Birth: 11/17/1941 Referring Provider:  Laurier NancyKhan, Shaukat A, MD  Below are the personal goals you chose as well as exercise and nutrition goals. Our goal is to help you keep on track towards obtaining and maintaining your goals. We will be discussing your progress on these goals with you throughout the program.  Initial Exercise Prescription:     Initial Exercise Prescription - 06/10/16 1200    Date of Initial Exercise RX and Referring Provider   Date 06/10/16   Referring Provider Welton FlakesKhan   Treadmill   MPH 2.5   Grade 1   Minutes 15   METs 3.26   Bike   Level 2   Watts 30   Minutes 15   METs 3   Recumbant Bike   Level 2   RPM 60   Watts 30   Minutes 15   METs 3   NuStep   Level 3   Watts 60   Minutes 15   METs 3.2   Recumbant Elliptical   Level 3   RPM 60   Watts 60   Minutes 15   METs 3.2   Elliptical   Level 1   Speed 50   Minutes 15   METs 3   REL-XR   Level 3   Watts 50   Minutes 15   METs 3   T5 Nustep   Level 3   Watts 50   Minutes 15   METs 3   Biostep-RELP   Level 3   Watts 60   Minutes 15   METs 3.2   Prescription Details   Frequency (times per week) 3   Duration Progress to 30 minutes of continuous aerobic without signs/symptoms of physical distress   Intensity   THRR 40-80% of Max Heartrate 101-132   Ratings of Perceived Exertion 11-15   Perceived Dyspnea 0-4   Progression   Progression Continue to progress workloads to maintain intensity without signs/symptoms of physical distress.   Resistance Training   Training Prescription Yes   Weight 3   Reps 10-15      Exercise Goals: Frequency: Be able to perform aerobic exercise three times per week working toward 3-5 days per week.  Intensity: Work with a perceived exertion of 11 (fairly light) - 15 (hard) as tolerated. Follow your new exercise prescription and watch for changes in prescription as you  progress with the program. Changes will be reviewed with you when they are made.  Duration: You should be able to do 30 minutes of continuous aerobic exercise in addition to a 5 minute warm-up and a 5 minute cool-down routine.  Nutrition Goals: Your personal nutrition goals will be established when you do your nutrition analysis with the dietician.  The following are nutrition guidelines to follow: Cholesterol < 200mg /day Sodium < 1500mg /day Fiber: Men over 50 yrs - 30 grams per day  Personal Goals:     Personal Goals and Risk Factors at Admission - 06/10/16 1305    Core Components/Risk Factors/Patient Goals on Admission   Sedentary Yes  Brandon Wilkerson said he does walk some.    Intervention Provide advice, education, support and counseling about physical activity/exercise needs.;Develop an individualized exercise prescription for aerobic and resistive training based on initial evaluation findings, risk stratification, comorbidities and participant's personal goals.   Expected Outcomes Achievement of increased cardiorespiratory fitness and enhanced flexibility, muscular endurance and strength shown through measurements of functional capacity and  personal statement of participant.   Diabetes Yes   Intervention Provide education about signs/symptoms and action to take for hypo/hyperglycemia.;Provide education about proper nutrition, including hydration, and aerobic/resistive exercise prescription along with prescribed medications to achieve blood glucose in normal ranges: Fasting glucose 65-99 mg/dL   Expected Outcomes Short Term: Participant verbalizes understanding of the signs/symptoms and immediate care of hyper/hypoglycemia, proper foot care and importance of medication, aerobic/resistive exercise and nutrition plan for blood glucose control.;Long Term: Attainment of HbA1C < 7%.   Hypertension Yes   Intervention Provide education on lifestyle modifcations including regular physical  activity/exercise, weight management, moderate sodium restriction and increased consumption of fresh fruit, vegetables, and low fat dairy, alcohol moderation, and smoking cessation.;Monitor prescription use compliance.   Expected Outcomes Short Term: Continued assessment and intervention until BP is < 140/2290mm HG in hypertensive participants. < 130/1680mm HG in hypertensive participants with diabetes, heart failure or chronic kidney disease.;Long Term: Maintenance of blood pressure at goal levels.      Tobacco Use Initial Evaluation: History  Smoking status  . Former Smoker  . Types: Cigarettes  . Quit date: 11/22/1979  Smokeless tobacco  . Never Used    Copy of goals given to participant.

## 2016-06-12 ENCOUNTER — Encounter: Payer: Medicare HMO | Admitting: *Deleted

## 2016-06-12 DIAGNOSIS — Z955 Presence of coronary angioplasty implant and graft: Secondary | ICD-10-CM | POA: Diagnosis not present

## 2016-06-12 LAB — GLUCOSE, CAPILLARY: Glucose-Capillary: 196 mg/dL — ABNORMAL HIGH (ref 65–99)

## 2016-06-12 NOTE — Progress Notes (Signed)
Daily Session Note  Patient Details  Name: IAAN OREGEL MRN: [id number] Date of Birth: 21-Dec-1940 Referring Provider:            Cardiac Rehab from 06/10/2016 in [facility] Cardiac and Pulmonary Rehab   Referring Provider  Dorthula Perfect Date: 06/12/2016  Check In:     Session Check In - 06/12/16 0935    Check-In   Location [facility]-Cardiac & Pulmonary Rehab   Staff Present Nyoka Cowden, RN, BSN, MA;Carroll Enterkin, RN, Levie Heritage, MA, ACSM RCEP, Exercise Physiologist   Supervising physician immediately available to respond to emergencies See telemetry face sheet for immediately available ER MD   Medication changes reported     No   Fall or balance concerns reported    No   Resistance Training Performed Yes   VAD Patient? No   Pain Assessment   Currently in Pain? No/denies   Multiple Pain Sites No         Goals Met:  Exercise tolerated well No report of cardiac concerns or symptoms Strength training completed today  Goals Unmet:  Not Applicable  Comments: First full day of exercise!  Patient was oriented to gym and equipment including functions, settings, policies, and procedures.  Patient's individual exercise prescription and treatment plan were reviewed.  All starting workloads were established based on the results of the 6 minute walk test done at initial orientation visit.  The plan for exercise progression was also introduced and progression will be customized based on patient's performance and goals.    Dr. Emily Filbert is Medical Director for [place] and [facility].2

## 2016-06-15 ENCOUNTER — Encounter: Payer: Medicare HMO | Admitting: *Deleted

## 2016-06-15 DIAGNOSIS — Z955 Presence of coronary angioplasty implant and graft: Secondary | ICD-10-CM | POA: Diagnosis not present

## 2016-06-15 NOTE — Progress Notes (Signed)
Daily Session Note  Patient Details  Name: SHAHRAM ALEXOPOULOS MRN: [id number] Date of Birth: Nov 24, 1941 Referring Provider:        Cardiac Rehab from 06/10/2016 in [facility] Cardiac and Pulmonary Rehab   Referring Provider  Dorthula Perfect Date: 06/15/2016  Check In:     Session Check In - 06/15/16 0816    Check-In   Location [facility]-Cardiac & Pulmonary Rehab   Staff Present Heath Lark, RN, BSN, Laveda Norman, BS, ACSM CEP, Exercise Physiologist;Jessica [place], [place], ACSM RCEP, Exercise Physiologist   Supervising physician immediately available to respond to emergencies See telemetry face sheet for immediately available ER MD   Medication changes reported     No   Fall or balance concerns reported    No   Warm-up and Cool-down Performed on first and last piece of equipment   Resistance Training Performed Yes   VAD Patient? No   Pain Assessment   Currently in Pain? No/denies   Multiple Pain Sites No         Goals Met:  Exercise tolerated well No report of cardiac concerns or symptoms  Goals Unmet:  Not Applicable  Comments: Doing well with exercise prescription progression.    Dr. Emily Filbert is Medical Director for [place] and [facility].

## 2016-06-15 NOTE — Progress Notes (Signed)
Daily Session Note  Patient Details  Name: Brandon Wilkerson MRN: [id number] Date of Birth: [birth year] Referring Provider:        Cardiac Rehab from 06/10/2016 in [facility] Cardiac and Pulmonary Rehab   Referring Provider  Dorthula Perfect Date: 06/15/2016  Check In:     Session Check In - 06/15/16 0816    Check-In   Location [facility]-Cardiac & Pulmonary Rehab   Staff Present Heath Lark, RN, BSN, Laveda Norman, BS, ACSM CEP, Exercise Physiologist;Jessica [place], [place], ACSM RCEP, Exercise Physiologist   Supervising physician immediately available to respond to emergencies See telemetry face sheet for immediately available ER MD   Medication changes reported     No   Fall or balance concerns reported    No   Warm-up and Cool-down Performed on first and last piece of equipment   Resistance Training Performed Yes   VAD Patient? No   Pain Assessment   Currently in Pain? No/denies   Multiple Pain Sites No         Goals Met:  Proper associated with RPD/PD & O2 Sat Independence with exercise equipment Exercise tolerated well No report of cardiac concerns or symptoms Strength training completed today  Goals Unmet:  Not Applicable  Comments: Patient completed exercise prescription and all exercise goals during rehab session. The exercise was tolerated well and the patient is progressing in the program.     Dr. Emily Filbert is Medical Director for [place] and [facility].

## 2016-06-17 DIAGNOSIS — Z955 Presence of coronary angioplasty implant and graft: Secondary | ICD-10-CM | POA: Diagnosis not present

## 2016-06-17 NOTE — Progress Notes (Signed)
Daily Session Note  Patient Details  Name: ZANDEN COLVER MRN: [id number] Date of Birth: 07/17/1941 Referring Provider:        Cardiac Rehab from 06/10/2016 in [facility] Cardiac and Pulmonary Rehab   Referring Provider  Dorthula Perfect Date: 06/17/2016  Check In:     Session Check In - 06/17/16 0858    Check-In   Location [facility]-Cardiac & Pulmonary Rehab   Staff Present Gerlene Burdock, RN, BSN;Jessica Luan Pulling, MA, ACSM RCEP, Exercise Physiologist;Amanda Oletta Darter, BA, ACSM CEP, Exercise Physiologist   Supervising physician immediately available to respond to emergencies See telemetry face sheet for immediately available ER MD   Medication changes reported     No   Fall or balance concerns reported    No   Warm-up and Cool-down Performed on first and last piece of equipment   Resistance Training Performed Yes   VAD Patient? No   Pain Assessment   Currently in Pain? No/denies   Multiple Pain Sites No         Goals Met:  Independence with exercise equipment Exercise tolerated well No report of cardiac concerns or symptoms Strength training completed today  Goals Unmet:  Not Applicable  Comments: Pt able to follow exercise prescription today without complaint.  Will continue to monitor for progression.    Dr. Emily Filbert is Medical Director for [place] and [facility].

## 2016-06-19 ENCOUNTER — Encounter: Payer: Medicare HMO | Admitting: *Deleted

## 2016-06-19 DIAGNOSIS — Z955 Presence of coronary angioplasty implant and graft: Secondary | ICD-10-CM

## 2016-06-19 NOTE — Progress Notes (Signed)
Daily Session Note  Patient Details  Name: BOBAK OGUINN MRN: [id number] Date of Birth: Apr 13, 1941 Referring Provider:        Cardiac Rehab from 06/10/2016 in [facility] Cardiac and Pulmonary Rehab   Referring Provider  Dorthula Perfect Date: 06/19/2016  Check In:     Session Check In - 06/19/16 0948    Check-In   Location [facility]-Cardiac & Pulmonary Rehab   Staff Present Nyoka Cowden, RN, BSN, MA;Carroll Enterkin, RN, Levie Heritage, MA, ACSM RCEP, Exercise Physiologist   Supervising physician immediately available to respond to emergencies See telemetry face sheet for immediately available ER MD   Medication changes reported     No   Fall or balance concerns reported    No   Warm-up and Cool-down Performed on first and last piece of equipment   Resistance Training Performed Yes   VAD Patient? No   Pain Assessment   Currently in Pain? No/denies   Multiple Pain Sites No         Goals Met:  Independence with exercise equipment Exercise tolerated well No report of cardiac concerns or symptoms Strength training completed today  Goals Unmet:  Not Applicable  Comments: Pt able to follow exercise prescription today without complaint.  Will continue to monitor for progression.    Dr. Emily Filbert is Medical Director for [place] and [facility].

## 2016-06-22 ENCOUNTER — Encounter: Payer: Medicare HMO | Admitting: *Deleted

## 2016-06-22 DIAGNOSIS — Z955 Presence of coronary angioplasty implant and graft: Secondary | ICD-10-CM | POA: Diagnosis not present

## 2016-06-22 NOTE — Progress Notes (Signed)
Daily Session Note  Patient Details  Name: Brandon Wilkerson MRN: [id number] Date of Birth: 1941-08-15 Referring Provider:        Cardiac Rehab from 06/10/2016 in [facility] Cardiac and Pulmonary Rehab   Referring Provider  Dorthula Perfect Date: 06/22/2016  Check In:     Session Check In - 06/22/16 0818    Check-In   Location [facility]-Cardiac & Pulmonary Rehab   Staff Present Heath Lark, RN, BSN, Laveda Norman, BS, ACSM CEP, Exercise Physiologist;Jessica [place], [place], ACSM RCEP, Exercise Physiologist   Supervising physician immediately available to respond to emergencies See telemetry face sheet for immediately available ER MD   Medication changes reported     No   Fall or balance concerns reported    No   Warm-up and Cool-down Performed on first and last piece of equipment   Resistance Training Performed Yes   VAD Patient? No   Pain Assessment   Currently in Pain? No/denies   Multiple Pain Sites No         Goals Met:  Independence with exercise equipment Exercise tolerated well No report of cardiac concerns or symptoms Strength training completed today  Goals Unmet:  Not Applicable  Comments: Patient completed exercise prescription and all exercise goals during rehab session. The exercise was tolerated well and the patient is progressing in the program.     Dr. Emily Filbert is Medical Director for [place] and [facility].

## 2016-06-24 ENCOUNTER — Encounter: Payer: Self-pay | Admitting: *Deleted

## 2016-06-24 DIAGNOSIS — Z955 Presence of coronary angioplasty implant and graft: Secondary | ICD-10-CM

## 2016-06-24 NOTE — Progress Notes (Signed)
Daily Session Note  Patient Details  Name: Brandon Wilkerson MRN: [id number] Date of Birth: [birth year] Referring Provider:        Cardiac Rehab from 06/10/2016 in [facility] Cardiac and Pulmonary Rehab   Referring Provider  Dorthula Perfect Date: 06/24/2016  Check In:     Session Check In - 06/24/16 0830    Check-In   Location [facility]-Cardiac & Pulmonary Rehab   Staff Present Alberteen Sam, MA, ACSM RCEP, Exercise Physiologist;Aeric Burnham Oletta Darter, BA, ACSM CEP, Exercise Physiologist;Carroll Enterkin, RN, BSN   Supervising physician immediately available to respond to emergencies See telemetry face sheet for immediately available ER MD   Medication changes reported     No   Fall or balance concerns reported    No   Warm-up and Cool-down Performed on first and last piece of equipment   Resistance Training Performed Yes   VAD Patient? No   Pain Assessment   Currently in Pain? No/denies   Multiple Pain Sites No         Goals Met:  Independence with exercise equipment Exercise tolerated well No report of cardiac concerns or symptoms Strength training completed today  Goals Unmet:  Not Applicable  Comments: Pt able to follow exercise prescription today without complaint.  Will continue to monitor for progression.    Dr. Emily Filbert is Medical Director for [place] and [facility].

## 2016-06-24 NOTE — Progress Notes (Signed)
Cardiac Individual Treatment Plan  Patient Details  Name: Brandon Wilkerson MRN: [id number] Date of Birth: August 20, 1941 Referring Provider:        Cardiac Rehab from 06/10/2016 in [facility] Cardiac and Pulmonary Rehab   Referring Provider  Humphrey Rolls      Initial Encounter Date:       Cardiac Rehab from 06/10/2016 in [facility] Cardiac and Pulmonary Rehab   Date  06/10/16   Referring Provider  Humphrey Rolls      Visit Diagnosis: Status post coronary artery stent placement  Patient's Home Medications on Admission:  Current outpatient prescriptions:  .  aspirin 81 MG tablet, Take 81 mg by mouth daily., Disp: , Rfl:  .  atorvastatin (LIPITOR) 80 MG tablet, Take 80 mg by mouth daily., Disp: , Rfl:  .  cloNIDine (CATAPRES) 0.3 MG tablet, Take 0.3 mg by mouth 2 (two) times daily., Disp: , Rfl:  .  clopidogrel (PLAVIX) 75 MG tablet, Take 75 mg by mouth daily., Disp: , Rfl:  .  Dulaglutide (TRULICITY [place]), Inject into the skin., Disp: , Rfl:  .  fluticasone (FLONASE) 50 MCG/ACT nasal spray, Place 1 spray into both nostrils daily., Disp: , Rfl:  .  gabapentin (NEURONTIN) 300 MG capsule, Take 1 capsule (300 mg total) by mouth 3 (three) times daily., Disp: 90 capsule, Rfl: 0 .  glipiZIDE (GLUCOTROL) 5 MG tablet, Take 2.5 mg by mouth 2 (two) times daily., Disp: , Rfl:  .  isosorbide mononitrate (IMDUR) 60 MG 24 hr tablet, Take 60 mg by mouth daily., Disp: , Rfl:  .  nitroGLYCERIN (NITROSTAT) 0.4 MG SL tablet, Place 0.4 mg under the tongue every 5 (five) minutes as needed for chest pain., Disp: , Rfl:  .  ONE TOUCH ULTRA TEST test strip, use as directed;  twice a day OR MORE if needed, Disp: , Rfl: 1  Past Medical History: Past Medical History  Diagnosis Date  . Hypertension   . PVD (peripheral vascular disease) ([place])   . Diabetes mellitus without complication ([place])   . Arthritis   . Rheumatic aortic stenosis   . Sleep apnea   . Chronic leukopenia     Tobacco Use: History  Smoking status  . Former Smoker  .  Types: Cigarettes  . Quit date: 11/22/1979  Smokeless tobacco  . Never Used    Labs: Recent Review Flowsheet Data    Labs for ITP Cardiac and Pulmonary Rehab Latest Ref Rng 04/26/2016   Hemoglobin A1c 4.0 - 6.0 % 6.2(H)       Exercise Target Goals:    Exercise Program Goal: Individual exercise prescription set with THRR, safety & activity barriers. Participant demonstrates ability to understand and report RPE using BORG scale, to self-measure pulse accurately, and to acknowledge the importance of the exercise prescription.  Exercise Prescription Goal: Starting with aerobic activity 30 plus minutes a day, 3 days per week for initial exercise prescription. Provide home exercise prescription and guidelines that participant acknowledges understanding prior to discharge.  Activity Barriers & Risk Stratification:     Activity Barriers & Cardiac Risk Stratification - 06/10/16 1309    Activity Barriers & Cardiac Risk Stratification   Activity Barriers None   Cardiac Risk Stratification Moderate      6 Minute Walk:     6 Minute Walk      06/10/16 1230       6 Minute Walk   Phase Initial     Distance 1545 feet     Walk Time 6  minutes     # of Rest Breaks 0     MPH 2.9     METS 3.5     RPE 13     VO2 Peak 12.3     Symptoms No     Resting HR 70 bpm     Resting BP 132/80 mmHg     Max Ex. HR 95 bpm     Max Ex. BP 164/60 mmHg        Initial Exercise Prescription:     Initial Exercise Prescription - 06/10/16 1200    Date of Initial Exercise RX and Referring Provider   Date 06/10/16   Referring Provider Humphrey Rolls   Treadmill   MPH 2.5   Grade 1   Minutes 15   METs 3.26   Bike   Level 2   Watts 30   Minutes 15   METs 3   Recumbant Bike   Level 2   RPM 60   Watts 30   Minutes 15   METs 3   NuStep   Level 3   Watts 60   Minutes 15   METs 3.2   Recumbant Elliptical   Level 3   RPM 60   Watts 60   Minutes 15   METs 3.2   Elliptical   Level 1   Speed 50    Minutes 15   METs 3   REL-XR   Level 3   Watts 50   Minutes 15   METs 3   T5 Nustep   Level 3   Watts 50   Minutes 15   METs 3   Biostep-RELP   Level 3   Watts 60   Minutes 15   METs 3.2   Prescription Details   Frequency (times per week) 3   Duration Progress to 30 minutes of continuous aerobic without signs/symptoms of physical distress   Intensity   THRR 40-80% of Max Heartrate 101-132   Ratings of Perceived Exertion 11-15   Perceived Dyspnea 0-4   Progression   Progression Continue to progress workloads to maintain intensity without signs/symptoms of physical distress.   Resistance Training   Training Prescription Yes   Weight 3   Reps 10-15      Perform Capillary Blood Glucose checks as needed.  Exercise Prescription Changes:     Exercise Prescription Changes      06/17/16 1600           Exercise Review   Progression Yes       Response to Exercise   Blood Pressure (Admit) 140/60 mmHg       Blood Pressure (Exercise) 114/66 mmHg       Blood Pressure (Exit) 124/66 mmHg       Heart Rate (Admit) 59 bpm       Heart Rate (Exercise) 118 bpm       Heart Rate (Exit) 68 bpm       Rating of Perceived Exertion (Exercise) 14       Symptoms none       Duration Progress to 45 minutes of aerobic exercise without signs/symptoms of physical distress       Intensity THRR unchanged       Progression   Progression Continue to progress workloads to maintain intensity without signs/symptoms of physical distress.       Average METs 2.65       Resistance Training   Training Prescription Yes       Weight 3  Reps 10-15       Interval Training   Interval Training No       Treadmill   MPH 2.5       Grade 1       Minutes 15       METs 3.26       REL-XR   Level 3       Minutes 15       METs 2.3       T5 Nustep   Level 4       Minutes 15       METs 2.4          Exercise Comments:     Exercise Comments      06/12/16 0934 06/17/16 1619          Exercise Comments First full day of exercise!  Patient was oriented to gym and equipment including functions, settings, policies, and procedures.  Patient's individual exercise prescription and treatment plan were reviewed.  All starting workloads were established based on the results of the 6 minute walk test done at initial orientation visit.  The plan for exercise progression was also introduced and progression will be customized based on patient's performance and goals. Kieran is off to a good start with exercise.  He is already noticing a difference in his energy levels.  He was able to use his push mower over the weekend!  We will continue to work with Nancee Liter to increase his workloads.         Discharge Exercise Prescription (Final Exercise Prescription Changes):     Exercise Prescription Changes - 06/17/16 1600    Exercise Review   Progression Yes   Response to Exercise   Blood Pressure (Admit) 140/60 mmHg   Blood Pressure (Exercise) 114/66 mmHg   Blood Pressure (Exit) 124/66 mmHg   Heart Rate (Admit) 59 bpm   Heart Rate (Exercise) 118 bpm   Heart Rate (Exit) 68 bpm   Rating of Perceived Exertion (Exercise) 14   Symptoms none   Duration Progress to 45 minutes of aerobic exercise without signs/symptoms of physical distress   Intensity THRR unchanged   Progression   Progression Continue to progress workloads to maintain intensity without signs/symptoms of physical distress.   Average METs 2.65   Resistance Training   Training Prescription Yes   Weight 3   Reps 10-15   Interval Training   Interval Training No   Treadmill   MPH 2.5   Grade 1   Minutes 15   METs 3.26   REL-XR   Level 3   Minutes 15   METs 2.3   T5 Nustep   Level 4   Minutes 15   METs 2.4      Nutrition:  Target Goals: Understanding of nutrition guidelines, daily intake of sodium <1542m, cholesterol <2028m calories 30% from fat and 7% or less from saturated fats, daily to have 5 or more servings of  fruits and vegetables.  Biometrics:     Pre Biometrics - 06/10/16 1228    Pre Biometrics   Height 6' 0.4" (1.839 m)   Weight 201 lb 12.8 oz (91.536 kg)   Waist Circumference 40.5 inches   Hip Circumference 42 inches   Waist to Hip Ratio 0.96 %   BMI (Calculated) 27.1   Single Leg Stand 30 seconds       Nutrition Therapy Plan and Nutrition Goals:   Nutrition Discharge: Rate Your Plate Scores:   Nutrition Goals Re-Evaluation:  Psychosocial: Target Goals: Acknowledge presence or absence of depression, maximize coping skills, provide positive support system. Participant is able to verbalize types and ability to use techniques and skills needed for reducing stress and depression.  Initial Review & Psychosocial Screening:     Initial Psych Review & Screening - 06/10/16 1306    Family Dynamics   Good Support System? Yes   Barriers   Psychosocial barriers to participate in program There are no identifiable barriers or psychosocial needs.   Screening Interventions   Interventions Encouraged to exercise      Quality of Life Scores:     Quality of Life - 06/10/16 1303    Quality of Life Scores   Health/Function Pre 25.71 %   Socioeconomic Pre 30 %   Psych/Spiritual Pre 30 %   Family Pre 30 %   GLOBAL Pre 28.13 %      PHQ-9:     Recent Review Flowsheet Data    Depression screen [facility] 2/9 06/10/2016   Decreased Interest 1   Down, Depressed, Hopeless 0   PHQ - 2 Score 1   Altered sleeping 0   Tired, decreased energy 1   Change in appetite 0   Feeling bad or failure about yourself  0   Trouble concentrating 0   Moving slowly or fidgety/restless 0   Suicidal thoughts 0   PHQ-9 Score 2   Difficult doing work/chores Somewhat difficult      Psychosocial Evaluation and Intervention:     Psychosocial Evaluation - 06/15/16 0931    Psychosocial Evaluation & Interventions   Interventions Encouraged to exercise with the program and follow exercise prescription    Comments Counselor met today with Mr. Kusek.  He is a 75 year old gentleman who had a stent inserted on 5/22.  He has a strong support system with a spouse of 46 years and an adult daughter who lives close by.  Mr. Miceli also is actively involved in his local church community.  He has diabetes as well as heart problems, but, otherwise reports he is in good health.  Mr. Wasco states he sleeps well, has a good appetite, and denies a history or current symptoms of depression or anxiety.  He reports that his mood is generally positive and he has minimal stress in his life.  His goals for this program are to improve his overall health and increase his longevity.  He plans to continue exercise upon completion of this program in the [employer] program as a follow up.       Psychosocial Re-Evaluation:   Vocational Rehabilitation: Provide vocational rehab assistance to qualifying candidates.   Vocational Rehab Evaluation & Intervention:     Vocational Rehab - 06/10/16 1307    Initial Vocational Rehab Evaluation & Intervention   Assessment shows need for Vocational Rehabilitation No      Education: Education Goals: Education classes will be provided on a weekly basis, covering required topics. Participant will state understanding/return demonstration of topics presented.  Learning Barriers/Preferences:     Learning Barriers/Preferences - 06/10/16 1309    Learning Barriers/Preferences   Learning Barriers None   Learning Preferences None      Education Topics: General Nutrition Guidelines/Fats and Fiber: -Group instruction provided by verbal, written material, models and posters to present the general guidelines for heart healthy nutrition. Gives an explanation and review of dietary fats and fiber.   Controlling Sodium/Reading Food Labels: -Group verbal and written material supporting the discussion of sodium use in  heart healthy nutrition. Review and explanation with  models, verbal and written materials for utilization of the food label.   Exercise Physiology & Risk Factors: - Group verbal and written instruction with models to review the exercise physiology of the cardiovascular system and associated critical values. Details cardiovascular disease risk factors and the goals associated with each risk factor.   Aerobic Exercise & Resistance Training: - Gives group verbal and written discussion on the health impact of inactivity. On the components of aerobic and resistive training programs and the benefits of this training and how to safely progress through these programs.   Flexibility, Balance, General Exercise Guidelines: - Provides group verbal and written instruction on the benefits of flexibility and balance training programs. Provides general exercise guidelines with specific guidelines to those with heart or lung disease. Demonstration and skill practice provided.   Stress Management: - Provides group verbal and written instruction about the health risks of elevated stress, cause of high stress, and healthy ways to reduce stress.   Depression: - Provides group verbal and written instruction on the correlation between heart/lung disease and depressed mood, treatment options, and the stigmas associated with seeking treatment.   Anatomy & Physiology of the Heart: - Group verbal and written instruction and models provide basic cardiac anatomy and physiology, with the coronary electrical and arterial systems. Review of: AMI, Angina, Valve disease, Heart Failure, Cardiac Arrhythmia, Pacemakers, and the ICD.   Cardiac Procedures: - Group verbal and written instruction and models to describe the testing methods done to diagnose heart disease. Reviews the outcomes of the test results. Describes the treatment choices: Medical Management, Angioplasty, or Coronary Bypass Surgery.   Cardiac Medications: - Group verbal and written instruction to review  commonly prescribed medications for heart disease. Reviews the medication, class of the drug, and side effects. Includes the steps to properly store meds and maintain the prescription regimen.          Cardiac Rehab from 06/22/2016 in [facility] Cardiac and Pulmonary Rehab   Date  06/22/16   Educator  SB   Instruction Review Code  2- meets goals/outcomes [cardiac meds lecture 2]      Go Sex-Intimacy & Heart Disease, Get SMART - Goal Setting: - Group verbal and written instruction through game format to discuss heart disease and the return to sexual intimacy. Provides group verbal and written material to discuss and apply goal setting through the application of the S.M.A.R.T. Method.   Other Matters of the Heart: - Provides group verbal, written materials and models to describe Heart Failure, Angina, Valve Disease, and Diabetes in the realm of heart disease. Includes description of the disease process and treatment options available to the cardiac patient.   Exercise & Equipment Safety: - Individual verbal instruction and demonstration of equipment use and safety with use of the equipment.      Cardiac Rehab from 06/22/2016 in [facility] Cardiac and Pulmonary Rehab   Date  06/10/16   Educator  C.EnterkinRN   Instruction Review Code  1- partially meets, needs review/practice      Infection Prevention: - Provides verbal and written material to individual with discussion of infection control including proper hand washing and proper equipment cleaning during exercise session.      Cardiac Rehab from 06/22/2016 in [facility] Cardiac and Pulmonary Rehab   Date  06/10/16   Educator  C. EnterkinRN [facility] 0.1so reinforced to use wipes to clean exercise equipmen]   Instruction Review Code  2- meets goals/outcomes  Falls Prevention: - Provides verbal and written material to individual with discussion of falls prevention and safety.      Cardiac Rehab from 06/22/2016 in [facility] Cardiac and Pulmonary Rehab   Date   06/10/16   Educator  C. [place]   Instruction Review Code  2- meets goals/outcomes      Diabetes: - Individual verbal and written instruction to review signs/symptoms of diabetes, desired ranges of glucose level fasting, after meals and with exercise. Advice that pre and post exercise glucose checks will be done for 3 sessions at entry of program.      Cardiac Rehab from 06/22/2016 in [facility] Cardiac and Pulmonary Rehab   Date  06/10/16   Educator  C. [place]   Instruction Review Code  1- partially meets, needs review/practice       Knowledge Questionnaire Score:     Knowledge Questionnaire Score - 06/10/16 1307    Knowledge Questionnaire Score   Pre Score 19      Core Components/Risk Factors/Patient Goals at Admission:     Personal Goals and Risk Factors at Admission - 06/10/16 1305    Core Components/Risk Factors/Patient Goals on Admission   Sedentary Yes  Tighe said he does walk some.    Intervention Provide advice, education, support and counseling about physical activity/exercise needs.;Develop an individualized exercise prescription for aerobic and resistive training based on initial evaluation findings, risk stratification, comorbidities and participant's personal goals.   Expected Outcomes Achievement of increased cardiorespiratory fitness and enhanced flexibility, muscular endurance and strength shown through measurements of functional capacity and personal statement of participant.   Diabetes Yes   Intervention Provide education about signs/symptoms and action to take for hypo/hyperglycemia.;Provide education about proper nutrition, including hydration, and aerobic/resistive exercise prescription along with prescribed medications to achieve blood glucose in normal ranges: Fasting glucose 65-99 mg/dL   Expected Outcomes Short Term: Participant verbalizes understanding of the signs/symptoms and immediate care of hyper/hypoglycemia, proper foot care and importance of  medication, aerobic/resistive exercise and nutrition plan for blood glucose control.;Long Term: Attainment of HbA1C < 7%.   Hypertension Yes   Intervention Provide education on lifestyle modifcations including regular physical activity/exercise, weight management, moderate sodium restriction and increased consumption of fresh fruit, vegetables, and low fat dairy, alcohol moderation, and smoking cessation.;Monitor prescription use compliance.   Expected Outcomes Short Term: Continued assessment and intervention until BP is < 140/38m HG in hypertensive participants. < 130/826mHG in hypertensive participants with diabetes, heart failure or chronic kidney disease.;Long Term: Maintenance of blood pressure at goal levels.      Core Components/Risk Factors/Patient Goals Review:      Goals and Risk Factor Review      06/17/16 0934           Core Components/Risk Factors/Patient Goals Review   Personal Goals Review Sedentary;Increase Strength and Stamina;Improve shortness of breath with ADL's;Hypertension;Diabetes       Review LeWilbons off to good start with program.  He is already able to tell a difference in how he is feeling.  He was able to use his push mower the other day.  His blood sugars have been stable.  His blood pressures have a stable pattern of being higher in the morning and lower by the afternoon.       Expected Outcomes We will continue to work with LeNancee Litern the progream and watching his blood pressures.  He will continue to feel better and stronger.  Core Components/Risk Factors/Patient Goals at Discharge (Final Review):      Goals and Risk Factor Review - 06/17/16 0934    Core Components/Risk Factors/Patient Goals Review   Personal Goals Review Sedentary;Increase Strength and Stamina;Improve shortness of breath with ADL's;Hypertension;Diabetes   Review Sholom is off to good start with program.  He is already able to tell a difference in how he is feeling.  He was  able to use his push mower the other day.  His blood sugars have been stable.  His blood pressures have a stable pattern of being higher in the morning and lower by the afternoon.   Expected Outcomes We will continue to work with Nancee Liter in the progream and watching his blood pressures.  He will continue to feel better and stronger.      ITP Comments:     ITP Comments      06/10/16 1310 06/24/16 0753         ITP Comments Ladavion is going to check with the [place] since he has a $45copay for each Cardiac REhab visit. Reinforced for Lenny to clean his exercise equipment before use since he reports his WBC has been 0.1 and he is at risk for infections. Prakash said he will talk to the Cardiac REhab registered dietician since it has been about 16 years since he met with one.  30 day review. Continue with ITP         Comments:

## 2016-06-25 ENCOUNTER — Inpatient Hospital Stay: Payer: Medicare HMO | Attending: Internal Medicine

## 2016-06-25 ENCOUNTER — Telehealth: Payer: Self-pay | Admitting: *Deleted

## 2016-06-25 ENCOUNTER — Inpatient Hospital Stay: Payer: Medicare HMO

## 2016-06-25 DIAGNOSIS — Z7689 Persons encountering health services in other specified circumstances: Secondary | ICD-10-CM | POA: Diagnosis not present

## 2016-06-25 DIAGNOSIS — D709 Neutropenia, unspecified: Secondary | ICD-10-CM | POA: Insufficient documentation

## 2016-06-25 DIAGNOSIS — D708 Other neutropenia: Secondary | ICD-10-CM

## 2016-06-25 LAB — CBC WITH DIFFERENTIAL/PLATELET
BASOS ABS: 0.1 10*3/uL (ref 0–0.1)
EOS ABS: 0.1 10*3/uL (ref 0–0.7)
HCT: 29.4 % — ABNORMAL LOW (ref 40.0–52.0)
Hemoglobin: 10.1 g/dL — ABNORMAL LOW (ref 13.0–18.0)
Lymphs Abs: 0.8 10*3/uL — ABNORMAL LOW (ref 1.0–3.6)
MCH: 30 pg (ref 26.0–34.0)
MCHC: 34.5 g/dL (ref 32.0–36.0)
MCV: 86.8 fL (ref 80.0–100.0)
MONO ABS: 0.5 10*3/uL (ref 0.2–1.0)
Monocytes Relative: 31 %
Neutro Abs: 0.1 10*3/uL — ABNORMAL LOW (ref 1.4–6.5)
Neutrophils Relative %: 9 %
PLATELETS: 172 10*3/uL (ref 150–440)
RBC: 3.39 MIL/uL — AB (ref 4.40–5.90)
RDW: 14.5 % (ref 11.5–14.5)
WBC: 1.7 10*3/uL — AB (ref 3.8–10.6)

## 2016-06-25 MED ORDER — TBO-FILGRASTIM 480 MCG/0.8ML ~~LOC~~ SOSY
480.0000 ug | PREFILLED_SYRINGE | Freq: Once | SUBCUTANEOUS | Status: AC
  Administered 2016-06-25: 480 ug via SUBCUTANEOUS
  Filled 2016-06-25: qty 0.8

## 2016-06-25 NOTE — Telephone Encounter (Signed)
Critical ANC - 0.1  MD notified. 

## 2016-06-26 ENCOUNTER — Inpatient Hospital Stay: Payer: Medicare HMO

## 2016-06-26 DIAGNOSIS — D708 Other neutropenia: Secondary | ICD-10-CM

## 2016-06-26 DIAGNOSIS — D709 Neutropenia, unspecified: Secondary | ICD-10-CM | POA: Diagnosis not present

## 2016-06-26 MED ORDER — TBO-FILGRASTIM 480 MCG/0.8ML ~~LOC~~ SOSY
480.0000 ug | PREFILLED_SYRINGE | Freq: Once | SUBCUTANEOUS | Status: AC
  Administered 2016-06-26: 480 ug via SUBCUTANEOUS
  Filled 2016-06-26: qty 0.8

## 2016-07-01 ENCOUNTER — Encounter: Payer: Medicare HMO | Admitting: *Deleted

## 2016-07-01 DIAGNOSIS — Z955 Presence of coronary angioplasty implant and graft: Secondary | ICD-10-CM | POA: Diagnosis not present

## 2016-07-01 NOTE — Progress Notes (Signed)
Daily Session Note  Patient Details  Name: Brandon Wilkerson MRN: [id number] Date of Birth: 10/22/1941 Referring Provider:   Flowsheet Row Cardiac Rehab from 06/10/2016 in [facility] Cardiac and Pulmonary Rehab  Referring Provider  Dorthula Perfect Date: 07/01/2016  Check In:     Session Check In - 07/01/16 0848      Check-In   Location [facility]-Cardiac & Pulmonary Rehab   Staff Present Alberteen Sam, MA, ACSM RCEP, Exercise Physiologist;Amanda Oletta Darter, BA, ACSM CEP, Exercise Physiologist;Carroll Enterkin, RN, BSN   Supervising physician immediately available to respond to emergencies See telemetry face sheet for immediately available ER MD   Medication changes reported     No   Fall or balance concerns reported    No   Warm-up and Cool-down Performed on first and last piece of equipment   Resistance Training Performed Yes   VAD Patient? No     Pain Assessment   Currently in Pain? No/denies   Multiple Pain Sites No           Exercise Prescription Changes - 06/30/16 1400      Exercise Review   Progression Yes     Response to Exercise   Blood Pressure (Admit) 114/62   Blood Pressure (Exercise) 142/68   Blood Pressure (Exit) 112/60   Heart Rate (Admit) 63 bpm   Heart Rate (Exercise) 113 bpm   Heart Rate (Exit) 70 bpm   Rating of Perceived Exertion (Exercise) 11   Symptoms none   Duration Progress to 45 minutes of aerobic exercise without signs/symptoms of physical distress   Intensity THRR unchanged     Progression   Progression Continue to progress workloads to maintain intensity without signs/symptoms of physical distress.   Average METs 2.98     Resistance Training   Training Prescription Yes   Weight 4   Reps 10-15     Interval Training   Interval Training No     Treadmill   MPH 2.8   Grade 1   Minutes 15   METs 3.53     REL-XR   Level 3   Minutes 15   METs 2.8     T5 Nustep   Level 4   Minutes 15   METs 2.6      Goals Met:  Independence  with exercise equipment Exercise tolerated well No report of cardiac concerns or symptoms Strength training completed today  Goals Unmet:  Not Applicable  Comments: Pt able to follow exercise prescription today without complaint.  Will continue to monitor for progression.    Dr. Emily Filbert is Medical Director for [name].

## 2016-07-08 ENCOUNTER — Encounter: Payer: Medicare HMO | Attending: Cardiovascular Disease

## 2016-07-08 DIAGNOSIS — Z955 Presence of coronary angioplasty implant and graft: Secondary | ICD-10-CM | POA: Insufficient documentation

## 2016-07-08 NOTE — Progress Notes (Signed)
Daily Session Note  Patient Details  Name: Brandon Wilkerson MRN: [id number] Date of Birth: [birth year] Referring Provider:   Flowsheet Row Cardiac Rehab from 06/10/2016 in [facility] Cardiac and Pulmonary Rehab  Referring Provider  Dorthula Perfect Date: 07/08/2016  Check In:     Session Check In - 07/08/16 0849      Check-In   Location [facility]-Cardiac & Pulmonary Rehab   Staff Present Alberteen Sam, MA, ACSM RCEP, Exercise Physiologist;Lynsay Fesperman Oletta Darter, BA, ACSM CEP, Exercise Physiologist;Carroll Enterkin, RN, BSN   Supervising physician immediately available to respond to emergencies See telemetry face sheet for immediately available ER MD   Medication changes reported     No   Fall or balance concerns reported    No   Warm-up and Cool-down Performed on first and last piece of equipment   Resistance Training Performed Yes   VAD Patient? No     Pain Assessment   Currently in Pain? No/denies   Multiple Pain Sites No         Goals Met:  Independence with exercise equipment Exercise tolerated well Personal goals reviewed Strength training completed today  Goals Unmet:  Not Applicable  Comments: Pt able to follow exercise prescription today without complaint.  Will continue to monitor for progression.    Dr. Emily Filbert is Medical Director for [place] and [facility].

## 2016-07-10 ENCOUNTER — Encounter: Payer: Medicare HMO | Admitting: *Deleted

## 2016-07-10 DIAGNOSIS — Z955 Presence of coronary angioplasty implant and graft: Secondary | ICD-10-CM

## 2016-07-10 NOTE — Progress Notes (Addendum)
Daily Session Note  Patient Details  Name: Brandon Wilkerson MRN: [id number] Date of Birth: Oct 31, 1941 Referring Provider:   Flowsheet Row Cardiac Rehab from 06/10/2016 in [facility] Cardiac and Pulmonary Rehab  Referring Provider  Dorthula Perfect Date: 07/10/2016  Check In:     Session Check In - 07/10/16 0854      Check-In   Staff Present Heath Lark, RN, BSN, CCRP;Jessica Luan Pulling, MA, ACSM RCEP, Exercise Physiologist;Carroll Enterkin, RN, BSN   Supervising physician immediately available to respond to emergencies See telemetry face sheet for immediately available ER MD   Medication changes reported     No   Fall or balance concerns reported    No   Warm-up and Cool-down Performed on first and last piece of equipment   Resistance Training Performed Yes   VAD Patient? No     Pain Assessment   Currently in Pain? No/denies         Goals Met:  Independence with exercise equipment Exercise tolerated well No report of cardiac concerns or symptoms Strength training completed today  Goals Unmet:  Not Applicable  Comments: Doing well with exercise prescription progression.  Reviewed home exercise with pt today.  Pt plans to walk at home for exercise.  Reviewed THR, pulse, RPE, sign and symptoms, NTG use, and when to call 911 or MD.  Also discussed weather considerations and indoor options.  Pt voiced understanding. Alberteen Sam, MA, ACSM RCEP 07/10/2016 10:34 AM  Dr. Emily Filbert is Medical Director for [place] and [facility].

## 2016-07-13 ENCOUNTER — Encounter: Payer: Medicare HMO | Admitting: *Deleted

## 2016-07-13 DIAGNOSIS — Z955 Presence of coronary angioplasty implant and graft: Secondary | ICD-10-CM | POA: Diagnosis not present

## 2016-07-13 NOTE — Progress Notes (Signed)
Daily Session Note  Patient Details  Name: LAWARENCE MEEK MRN: [id number] Date of Birth: 1941/01/23 Referring Provider:   Flowsheet Row Cardiac Rehab from 06/10/2016 in [facility] Cardiac and Pulmonary Rehab  Referring Provider  Dorthula Perfect Date: 07/13/2016  Check In:     Session Check In - 07/13/16 0911      Check-In   Location [facility]-Cardiac & Pulmonary Rehab   Staff Present Alberteen Sam, MA, ACSM RCEP, Exercise Physiologist;Susanne Bice, RN, BSN, Laveda Norman, BS, ACSM CEP, Exercise Physiologist   Supervising physician immediately available to respond to emergencies See telemetry face sheet for immediately available ER MD   Medication changes reported     No   Fall or balance concerns reported    No   Warm-up and Cool-down Performed on first and last piece of equipment   Resistance Training Performed Yes   VAD Patient? No     Pain Assessment   Currently in Pain? No/denies   Multiple Pain Sites No         Goals Met:  Independence with exercise equipment Exercise tolerated well No report of cardiac concerns or symptoms Strength training completed today  Goals Unmet:  Not Applicable  Comments: Pt able to follow exercise prescription today without complaint.  Will continue to monitor for progression.    Dr. Emily Filbert is Medical Director for [place] and [facility].

## 2016-07-15 DIAGNOSIS — Z955 Presence of coronary angioplasty implant and graft: Secondary | ICD-10-CM | POA: Diagnosis not present

## 2016-07-15 NOTE — Progress Notes (Signed)
Daily Session Note  Patient Details  Name: Brandon Wilkerson MRN: [id number] Date of Birth: Jun 28, 1941 Referring Provider:   Flowsheet Row Cardiac Rehab from 06/10/2016 in [facility] Cardiac and Pulmonary Rehab  Referring Provider  Dorthula Perfect Date: 07/15/2016  Check In:     Session Check In - 07/15/16 0814      Check-In   Location [facility]-Cardiac & Pulmonary Rehab   Staff Present Alberteen Sam, MA, ACSM RCEP, Exercise Physiologist;Michiah Mudry Oletta Darter, BA, ACSM CEP, Exercise Physiologist;Carroll Enterkin, RN, BSN   Supervising physician immediately available to respond to emergencies See telemetry face sheet for immediately available ER MD   Medication changes reported     No   Fall or balance concerns reported    No   Warm-up and Cool-down Performed on first and last piece of equipment   Resistance Training Performed Yes   VAD Patient? No     Pain Assessment   Currently in Pain? No/denies   Multiple Pain Sites No         Goals Met:  Independence with exercise equipment Exercise tolerated well Strength training completed today  Goals Unmet:  Not Applicable  Comments: Pt able to follow exercise prescription today without complaint.  Will continue to monitor for progression.    Dr. Emily Filbert is Medical Director for [place] and [facility].

## 2016-07-16 ENCOUNTER — Telehealth: Payer: Self-pay | Admitting: *Deleted

## 2016-07-16 ENCOUNTER — Inpatient Hospital Stay: Payer: Medicare HMO | Attending: Internal Medicine

## 2016-07-16 ENCOUNTER — Inpatient Hospital Stay: Payer: Medicare HMO

## 2016-07-16 VITALS — BP 111/51 | HR 74 | Resp 16

## 2016-07-16 DIAGNOSIS — I739 Peripheral vascular disease, unspecified: Secondary | ICD-10-CM | POA: Insufficient documentation

## 2016-07-16 DIAGNOSIS — Z7982 Long term (current) use of aspirin: Secondary | ICD-10-CM | POA: Diagnosis not present

## 2016-07-16 DIAGNOSIS — M129 Arthropathy, unspecified: Secondary | ICD-10-CM | POA: Diagnosis not present

## 2016-07-16 DIAGNOSIS — I060 Rheumatic aortic stenosis: Secondary | ICD-10-CM | POA: Diagnosis not present

## 2016-07-16 DIAGNOSIS — Z79899 Other long term (current) drug therapy: Secondary | ICD-10-CM | POA: Insufficient documentation

## 2016-07-16 DIAGNOSIS — Z803 Family history of malignant neoplasm of breast: Secondary | ICD-10-CM | POA: Insufficient documentation

## 2016-07-16 DIAGNOSIS — D631 Anemia in chronic kidney disease: Secondary | ICD-10-CM | POA: Insufficient documentation

## 2016-07-16 DIAGNOSIS — D708 Other neutropenia: Secondary | ICD-10-CM

## 2016-07-16 DIAGNOSIS — N189 Chronic kidney disease, unspecified: Secondary | ICD-10-CM | POA: Diagnosis not present

## 2016-07-16 DIAGNOSIS — G4730 Sleep apnea, unspecified: Secondary | ICD-10-CM | POA: Diagnosis not present

## 2016-07-16 DIAGNOSIS — E119 Type 2 diabetes mellitus without complications: Secondary | ICD-10-CM | POA: Insufficient documentation

## 2016-07-16 DIAGNOSIS — K649 Unspecified hemorrhoids: Secondary | ICD-10-CM | POA: Insufficient documentation

## 2016-07-16 DIAGNOSIS — D709 Neutropenia, unspecified: Secondary | ICD-10-CM | POA: Diagnosis present

## 2016-07-16 DIAGNOSIS — I129 Hypertensive chronic kidney disease with stage 1 through stage 4 chronic kidney disease, or unspecified chronic kidney disease: Secondary | ICD-10-CM | POA: Insufficient documentation

## 2016-07-16 DIAGNOSIS — Z7689 Persons encountering health services in other specified circumstances: Secondary | ICD-10-CM | POA: Insufficient documentation

## 2016-07-16 DIAGNOSIS — D72819 Decreased white blood cell count, unspecified: Secondary | ICD-10-CM | POA: Diagnosis not present

## 2016-07-16 DIAGNOSIS — I10 Essential (primary) hypertension: Secondary | ICD-10-CM | POA: Insufficient documentation

## 2016-07-16 LAB — CBC WITH DIFFERENTIAL/PLATELET
BASOS ABS: 0 10*3/uL (ref 0–0.1)
Basophils Relative: 3 %
Eosinophils Absolute: 0.1 10*3/uL (ref 0–0.7)
Eosinophils Relative: 3 %
HEMATOCRIT: 30.1 % — AB (ref 40.0–52.0)
HEMOGLOBIN: 10.3 g/dL — AB (ref 13.0–18.0)
Lymphs Abs: 0.8 10*3/uL — ABNORMAL LOW (ref 1.0–3.6)
MCH: 29.8 pg (ref 26.0–34.0)
MCHC: 34.2 g/dL (ref 32.0–36.0)
MCV: 87 fL (ref 80.0–100.0)
Monocytes Absolute: 1 10*3/uL (ref 0.2–1.0)
Monocytes Relative: 49 %
NEUTROS ABS: 0.1 10*3/uL — AB (ref 1.4–6.5)
Neutrophils Relative %: 6 %
Platelets: 188 10*3/uL (ref 150–440)
RBC: 3.46 MIL/uL — AB (ref 4.40–5.90)
RDW: 14.8 % — ABNORMAL HIGH (ref 11.5–14.5)
WBC: 2 10*3/uL — AB (ref 3.8–10.6)

## 2016-07-16 MED ORDER — TBO-FILGRASTIM 480 MCG/0.8ML ~~LOC~~ SOSY
480.0000 ug | PREFILLED_SYRINGE | Freq: Once | SUBCUTANEOUS | Status: AC
Start: 2016-07-16 — End: 2016-07-16
  Administered 2016-07-16: 480 ug via SUBCUTANEOUS
  Filled 2016-07-16: qty 0.8

## 2016-07-16 NOTE — Telephone Encounter (Signed)
Critical ANC - 0.1  MD notified. 

## 2016-07-17 ENCOUNTER — Inpatient Hospital Stay: Payer: Medicare HMO

## 2016-07-17 DIAGNOSIS — D709 Neutropenia, unspecified: Secondary | ICD-10-CM | POA: Diagnosis not present

## 2016-07-17 DIAGNOSIS — D708 Other neutropenia: Secondary | ICD-10-CM

## 2016-07-17 MED ORDER — TBO-FILGRASTIM 480 MCG/0.8ML ~~LOC~~ SOSY
480.0000 ug | PREFILLED_SYRINGE | Freq: Once | SUBCUTANEOUS | Status: AC
  Administered 2016-07-17: 480 ug via SUBCUTANEOUS
  Filled 2016-07-17: qty 0.8

## 2016-07-20 ENCOUNTER — Encounter: Payer: Medicare HMO | Admitting: *Deleted

## 2016-07-20 DIAGNOSIS — Z955 Presence of coronary angioplasty implant and graft: Secondary | ICD-10-CM | POA: Diagnosis not present

## 2016-07-20 NOTE — Progress Notes (Signed)
Daily Session Note  Patient Details  Name: Brandon Wilkerson MRN: [id number] Date of Birth: 09-12-1941 Referring Provider:   Flowsheet Row Cardiac Rehab from 06/10/2016 in [facility] Cardiac and Pulmonary Rehab  Referring Provider  Dorthula Perfect Date: 07/20/2016  Check In:     Session Check In - 07/20/16 0813      Check-In   Location [facility]-Cardiac & Pulmonary Rehab   Staff Present Alberteen Sam, MA, ACSM RCEP, Exercise Physiologist;Kelly Amedeo Plenty, BS, ACSM CEP, Exercise Physiologist;Carroll Enterkin, RN, BSN   Supervising physician immediately available to respond to emergencies See telemetry face sheet for immediately available ER MD   Medication changes reported     No   Fall or balance concerns reported    No   Warm-up and Cool-down Performed on first and last piece of equipment   Resistance Training Performed Yes   VAD Patient? No     Pain Assessment   Currently in Pain? No/denies   Multiple Pain Sites No         Goals Met:  Independence with exercise equipment Exercise tolerated well No report of cardiac concerns or symptoms Strength training completed today  Goals Unmet:  Not Applicable  Comments: Pt able to follow exercise prescription today without complaint.  Will continue to monitor for progression.    Dr. Emily Filbert is Medical Director for [place] and [facility].

## 2016-07-22 ENCOUNTER — Encounter: Payer: Self-pay | Admitting: *Deleted

## 2016-07-22 DIAGNOSIS — Z955 Presence of coronary angioplasty implant and graft: Secondary | ICD-10-CM | POA: Diagnosis not present

## 2016-07-22 NOTE — Progress Notes (Signed)
Daily Session Note  Patient Details  Name: ERLIN GARDELLA MRN: [id number] Date of Birth: 1941/03/29 Referring Provider:   Flowsheet Row Cardiac Rehab from 06/10/2016 in [facility] Cardiac and Pulmonary Rehab  Referring Provider  Dorthula Perfect Date: 07/22/2016  Check In:     Session Check In - 07/22/16 0846      Check-In   Location [facility]-Cardiac & Pulmonary Rehab   Staff Present Alberteen Sam, MA, ACSM RCEP, Exercise Physiologist;Jilleen Essner Oletta Darter, BA, ACSM CEP, Exercise Physiologist;Carroll Enterkin, RN, BSN   Supervising physician immediately available to respond to emergencies See telemetry face sheet for immediately available ER MD   Medication changes reported     No   Fall or balance concerns reported    No   Warm-up and Cool-down Performed on first and last piece of equipment   Resistance Training Performed Yes   VAD Patient? No     Pain Assessment   Currently in Pain? No/denies   Multiple Pain Sites No         Goals Met:  Proper associated with RPD/PD & O2 Sat Independence with exercise equipment No report of cardiac concerns or symptoms Strength training completed today  Goals Unmet:  Not Applicable  Comments: Pt able to follow exercise prescription today without complaint.  Will continue to monitor for progression.    Dr. Emily Filbert is Medical Director for [place] and [facility].

## 2016-07-22 NOTE — Progress Notes (Signed)
Cardiac Individual Treatment Plan  Patient Details  Name: Brandon Wilkerson MRN: [id number] Date of Birth: May 30, 1941 Referring Provider:   Flowsheet Row Cardiac Rehab from 06/10/2016 in [facility] Cardiac and Pulmonary Rehab  Referring Provider  Humphrey Rolls      Initial Encounter Date:  Flowsheet Row Cardiac Rehab from 06/10/2016 in [facility] Cardiac and Pulmonary Rehab  Date  06/10/16  Referring Provider  Humphrey Rolls      Visit Diagnosis: Status post coronary artery stent placement  Patient's Home Medications on Admission:  Current Outpatient Prescriptions:  .  aspirin 81 MG tablet, Take 81 mg by mouth daily., Disp: , Rfl:  .  atorvastatin (LIPITOR) 80 MG tablet, Take 80 mg by mouth daily., Disp: , Rfl:  .  cloNIDine (CATAPRES) 0.3 MG tablet, Take 0.3 mg by mouth 2 (two) times daily., Disp: , Rfl:  .  clopidogrel (PLAVIX) 75 MG tablet, Take 75 mg by mouth daily., Disp: , Rfl:  .  Dulaglutide (TRULICITY [place]), Inject into the skin., Disp: , Rfl:  .  fluticasone (FLONASE) 50 MCG/ACT nasal spray, Place 1 spray into both nostrils daily., Disp: , Rfl:  .  gabapentin (NEURONTIN) 300 MG capsule, Take 1 capsule (300 mg total) by mouth 3 (three) times daily., Disp: 90 capsule, Rfl: 0 .  glipiZIDE (GLUCOTROL) 5 MG tablet, Take 2.5 mg by mouth 2 (two) times daily., Disp: , Rfl:  .  isosorbide mononitrate (IMDUR) 60 MG 24 hr tablet, Take 60 mg by mouth daily., Disp: , Rfl:  .  nitroGLYCERIN (NITROSTAT) 0.4 MG SL tablet, Place 0.4 mg under the tongue every 5 (five) minutes as needed for chest pain., Disp: , Rfl:  .  ONE TOUCH ULTRA TEST test strip, use as directed;  twice a day OR MORE if needed, Disp: , Rfl: 1  Past Medical History: Past Medical History:  Diagnosis Date  . Arthritis   . Chronic leukopenia   . Diabetes mellitus without complication ([place])   . Hypertension   . PVD (peripheral vascular disease) ([place])   . Rheumatic aortic stenosis   . Sleep apnea     Tobacco Use: History  Smoking Status  . Former  Smoker  . Types: Cigarettes  . Quit date: 11/22/1979  Smokeless Tobacco  . Never Used    Labs: Recent Review Flowsheet Data    Labs for ITP Cardiac and Pulmonary Rehab Latest Ref Rng & Units 04/26/2016   Hemoglobin A1c 4.0 - 6.0 % 6.2(H)       Exercise Target Goals:    Exercise Program Goal: Individual exercise prescription set with THRR, safety & activity barriers. Participant demonstrates ability to understand and report RPE using BORG scale, to self-measure pulse accurately, and to acknowledge the importance of the exercise prescription.  Exercise Prescription Goal: Starting with aerobic activity 30 plus minutes a day, 3 days per week for initial exercise prescription. Provide home exercise prescription and guidelines that participant acknowledges understanding prior to discharge.  Activity Barriers & Risk Stratification:     Activity Barriers & Cardiac Risk Stratification - 06/10/16 1309      Activity Barriers & Cardiac Risk Stratification   Activity Barriers None   Cardiac Risk Stratification Moderate      6 Minute Walk:     6 Minute Walk    Row Name 06/10/16 1230         6 Minute Walk   Phase Initial     Distance 1545 feet     Walk Time 6 minutes     #  of Rest Breaks 0     MPH 2.9     METS 3.5     RPE 13     VO2 Peak 12.3     Symptoms No     Resting HR 70 bpm     Resting BP 132/80     Max Ex. HR 95 bpm     Max Ex. BP 164/60        Initial Exercise Prescription:     Initial Exercise Prescription - 06/10/16 1200      Date of Initial Exercise RX and Referring Provider   Date 06/10/16   Referring Provider Humphrey Rolls     Treadmill   MPH 2.5   Grade 1   Minutes 15   METs 3.26     Bike   Level 2   Watts 30   Minutes 15   METs 3     Recumbant Bike   Level 2   RPM 60   Watts 30   Minutes 15   METs 3     NuStep   Level 3   Watts 60   Minutes 15   METs 3.2     Recumbant Elliptical   Level 3   RPM 60   Watts 60   Minutes 15   METs  3.2     Elliptical   Level 1   Speed 50   Minutes 15   METs 3     REL-XR   Level 3   Watts 50   Minutes 15   METs 3     T5 Nustep   Level 3   Watts 50   Minutes 15   METs 3     Biostep-RELP   Level 3   Watts 60   Minutes 15   METs 3.2     Prescription Details   Frequency (times per week) 3   Duration Progress to 30 minutes of continuous aerobic without signs/symptoms of physical distress     Intensity   THRR 40-80% of Max Heartrate 101-132   Ratings of Perceived Exertion 11-15   Perceived Dyspnea 0-4     Progression   Progression Continue to progress workloads to maintain intensity without signs/symptoms of physical distress.     Resistance Training   Training Prescription Yes   Weight 3   Reps 10-15      Perform Capillary Blood Glucose checks as needed.  Exercise Prescription Changes:     Exercise Prescription Changes    Row Name 06/17/16 1600 06/30/16 1400 07/10/16 1000 07/15/16 1100       Exercise Review   Progression Yes Yes Yes Yes      Response to Exercise   Blood Pressure (Admit) 140/60 114/62  - 126/64    Blood Pressure (Exercise) 114/66 142/68  - 152/64    Blood Pressure (Exit) 124/66 112/60  - 112/64    Heart Rate (Admit) 59 bpm 63 bpm  - 80 bpm    Heart Rate (Exercise) 118 bpm 113 bpm  - 112 bpm    Heart Rate (Exit) 68 bpm 70 bpm  - 81 bpm    Rating of Perceived Exertion (Exercise) 14 11  - 11    Symptoms none none none none    Comments  -  - Home Exercise Guidelines given 07/10/16 Home Exercise Guidelines given 07/10/16    Duration Progress to 45 minutes of aerobic exercise without signs/symptoms of physical distress Progress to 45 minutes of aerobic exercise without signs/symptoms of  physical distress Progress to 45 minutes of aerobic exercise without signs/symptoms of physical distress Progress to 45 minutes of aerobic exercise without signs/symptoms of physical distress    Intensity THRR unchanged THRR unchanged THRR unchanged THRR  unchanged      Progression   Progression Continue to progress workloads to maintain intensity without signs/symptoms of physical distress. Continue to progress workloads to maintain intensity without signs/symptoms of physical distress. Continue to progress workloads to maintain intensity without signs/symptoms of physical distress. Continue to progress workloads to maintain intensity without signs/symptoms of physical distress.    Average METs 2.65 2.98 2.98 3.27      Resistance Training   Training Prescription Yes Yes Yes Yes    Weight _0 Reps 10-15 10-15 10-15 10-15      Interval Training   Interval Training No No No No      Treadmill   MPH 2.5 2.8 2.8 3    Grade _1 Minutes _2 METs 3.26 3.53 3.53 3.71      Recumbant Elliptical   Level  -  -  - 3    RPM  -  -  - 60    Minutes  -  -  - 15    METs  -  -  - 3.5      REL-XR   Level _3 -    Minutes _4 -    METs 2.3 2.8 2.8  -      T5 Nustep   Level _5 Minutes _6 METs 2.4 2.6 2.6 2.6      Home Exercise Plan   Plans to continue exercise at  -  - Maintenance  walking Home  walking    Frequency  -  - Add 4 additional days to program exercise sessions. Add 4 additional days to program exercise sessions.       Exercise Comments:     Exercise Comments    Row Name 06/12/16 3662 06/17/16 1619 06/30/16 1404 07/08/16 1049 07/10/16 1035   Exercise Comments First full day of exercise!  Patient was oriented to gym and equipment including functions, settings, policies, and procedures.  Patient's individual exercise prescription and treatment plan were reviewed.  All starting workloads were established based on the results of the 6 minute walk test done at initial orientation visit.  The plan for exercise progression was also introduced and progression will be customized based on patient's performance and goals. Franke is off to a good start with exercise.  He is already  noticing a difference in his energy levels.  He was able to use his push mower over the weekend!  We will continue to work with Nancee Liter to increase his workloads. Garan continues to do well with exercise and continues to make progress.  We will continue to monitor for progression. Corney is walking on is off days.  He has expressed some interest in joing the [facility] upon graduation with his wife through [employer].  He was given information about the program.  He acknowledge that he needs a place to go so that he will stay consisitent with his exercise. Reviewed home exercise with pt today.  Pt plans to walk at home for exercise.  Reviewed THR, pulse, RPE, sign and symptoms, NTG use, and when to call  911 or MD.  Also discussed weather considerations and indoor options.  Pt voiced understanding.   [place] Name 07/15/16 1108           Exercise Comments Faustino is doing great with exercise.  He continues to walk at home on his off days from rehab.  He is now up to 3.5 METs on the recumbent elliptical.  We will continue to monitor for progress.          Discharge Exercise Prescription (Final Exercise Prescription Changes):     Exercise Prescription Changes - 07/15/16 1100      Exercise Review   Progression Yes     Response to Exercise   Blood Pressure (Admit) 126/64   Blood Pressure (Exercise) 152/64   Blood Pressure (Exit) 112/64   Heart Rate (Admit) 80 bpm   Heart Rate (Exercise) 112 bpm   Heart Rate (Exit) 81 bpm   Rating of Perceived Exertion (Exercise) 11   Symptoms none   Comments Home Exercise Guidelines given 07/10/16   Duration Progress to 45 minutes of aerobic exercise without signs/symptoms of physical distress   Intensity THRR unchanged     Progression   Progression Continue to progress workloads to maintain intensity without signs/symptoms of physical distress.   Average METs 3.27     Resistance Training   Training Prescription Yes   Weight 4   Reps 10-15      Interval Training   Interval Training No     Treadmill   MPH 3   Grade 1   Minutes 15   METs 3.71     Recumbant Elliptical   Level 3   RPM 60   Minutes 15   METs 3.5     T5 Nustep   Level 4   Minutes 15   METs 2.6     Home Exercise Plan   Plans to continue exercise at Home  walking   Frequency Add 4 additional days to program exercise sessions.      Nutrition:  Target Goals: Understanding of nutrition guidelines, daily intake of sodium <1562m, cholesterol <2058m calories 30% from fat and 7% or less from saturated fats, daily to have 5 or more servings of fruits and vegetables.  Biometrics:     Pre Biometrics - 06/10/16 1228      Pre Biometrics   Height 6' 0.4" (1.839 m)   Weight 201 lb 12.8 oz (91.5 kg)   Waist Circumference 40.5 inches   Hip Circumference 42 inches   Waist to Hip Ratio 0.96 %   BMI (Calculated) 27.1   Single Leg Stand 30 seconds       Nutrition Therapy Plan and Nutrition Goals:     Nutrition Therapy & Goals - 07/10/16 1045      Nutrition Therapy   Diet Instructed patient on a heart healthy meal plan including dietary guidelines for diabetes.  Meal plan is based on 2000 calories as patient wants to lose a few pounds.   Drug/Food Interactions Statins/Certain Fruits   Protein (specify units) 8   Fiber 30 grams   Whole Grain Foods 3 servings   Saturated Fats 13 max. grams   Fruits and Vegetables 5 servings/day  Patient loves fruit and reports he eats larger portions than recommended on diabetes meal plan.   Sodium 2000 grams  [id number]     Personal Nutrition Goals   Personal Goal #1 Read labels for saturated fat, trans fat and sodium   Personal Goal #2 Be more  aware of portion control especially of starches and fruits.   Personal Goal #3 Include at least 8 oz of protein daily.   Comments Overall, patient makes healthy food choices. He states he just needs to "get back on track" with portion control.     Intervention Plan    Intervention Prescribe, educate and counsel regarding individualized specific dietary modifications aiming towards targeted core components such as weight, hypertension, lipid management, diabetes, heart failure and other comorbidities.;Nutrition handout(s) given to patient.   Expected Outcomes Short Term Goal: Understand basic principles of dietary content, such as calories, fat, sodium, cholesterol and nutrients.;Short Term Goal: A plan has been developed with personal nutrition goals set during dietitian appointment.;Long Term Goal: Adherence to prescribed nutrition plan.      Nutrition Discharge: Rate Your Plate Scores:   Nutrition Goals Re-Evaluation:   Psychosocial: Target Goals: Acknowledge presence or absence of depression, maximize coping skills, provide positive support system. Participant is able to verbalize types and ability to use techniques and skills needed for reducing stress and depression.  Initial Review & Psychosocial Screening:     Initial Psych Review & Screening - 06/10/16 [place]? Yes     Barriers   Psychosocial barriers to participate in program There are no identifiable barriers or psychosocial needs.     Screening Interventions   Interventions Encouraged to exercise      Quality of Life Scores:     Quality of Life - 06/10/16 1303      Quality of Life Scores   Health/Function Pre 25.71 %   Socioeconomic Pre 30 %   Psych/Spiritual Pre 30 %   Family Pre 30 %   GLOBAL Pre 28.13 %      PHQ-9: Recent Review Flowsheet Data    Depression screen [facility] 2/9 06/10/2016   Decreased Interest 1   Down, Depressed, Hopeless 0   PHQ - 2 Score 1   Altered sleeping 0   Tired, decreased energy 1   Change in appetite 0   Feeling bad or failure about yourself  0   Trouble concentrating 0   Moving slowly or fidgety/restless 0   Suicidal thoughts 0   PHQ-9 Score 2   Difficult doing work/chores Somewhat difficult       Psychosocial Evaluation and Intervention:     Psychosocial Evaluation - 06/15/16 0931      Psychosocial Evaluation & Interventions   Interventions Encouraged to exercise with the program and follow exercise prescription   Comments Counselor met today with Mr. Steege.  He is a 75 year old gentleman who had a stent inserted on 5/22.  He has a strong support system with a spouse of 76 years and an adult daughter who lives close by.  Mr. Kocsis also is actively involved in his local church community.  He has diabetes as well as heart problems, but, otherwise reports he is in good health.  Mr. Dennie states he sleeps well, has a good appetite, and denies a history or current symptoms of depression or anxiety.  He reports that his mood is generally positive and he has minimal stress in his life.  His goals for this program are to improve his overall health and increase his longevity.  He plans to continue exercise upon completion of this program in the [employer] program as a follow up.       Psychosocial Re-Evaluation:   Vocational Rehabilitation: Provide vocational rehab assistance  to qualifying candidates.   Vocational Rehab Evaluation & Intervention:     Vocational Rehab - 06/10/16 1307      Initial Vocational Rehab Evaluation & Intervention   Assessment shows need for Vocational Rehabilitation No      Education: Education Goals: Education classes will be provided on a weekly basis, covering required topics. Participant will state understanding/return demonstration of topics presented.  Learning Barriers/Preferences:     Learning Barriers/Preferences - 06/10/16 1309      Learning Barriers/Preferences   Learning Barriers None   Learning Preferences None      Education Topics: General Nutrition Guidelines/Fats and Fiber: -Group instruction provided by verbal, written material, models and posters to present the general guidelines for heart healthy nutrition.  Gives an explanation and review of dietary fats and fiber.   Controlling Sodium/Reading Food Labels: -Group verbal and written material supporting the discussion of sodium use in heart healthy nutrition. Review and explanation with models, verbal and written materials for utilization of the food label. Flowsheet Row Cardiac Rehab from 07/20/2016 in [facility] Cardiac and Pulmonary Rehab  Date  07/13/16  Educator  CR  Instruction Review Code  2- meets goals/outcomes      Exercise Physiology & Risk Factors: - Group verbal and written instruction with models to review the exercise physiology of the cardiovascular system and associated critical values. Details cardiovascular disease risk factors and the goals associated with each risk factor.   Aerobic Exercise & Resistance Training: - Gives group verbal and written discussion on the health impact of inactivity. On the components of aerobic and resistive training programs and the benefits of this training and how to safely progress through these programs.   Flexibility, Balance, General Exercise Guidelines: - Provides group verbal and written instruction on the benefits of flexibility and balance training programs. Provides general exercise guidelines with specific guidelines to those with heart or lung disease. Demonstration and skill practice provided. Flowsheet Row Cardiac Rehab from 07/20/2016 in [facility] Cardiac and Pulmonary Rehab  Date  07/20/16  Educator  [facility]  Instruction Review Code  2- meets goals/outcomes      Stress Management: - Provides group verbal and written instruction about the health risks of elevated stress, cause of high stress, and healthy ways to reduce stress.   Depression: - Provides group verbal and written instruction on the correlation between heart/lung disease and depressed mood, treatment options, and the stigmas associated with seeking treatment.   Anatomy & Physiology of the Heart: - Group verbal and written  instruction and models provide basic cardiac anatomy and physiology, with the coronary electrical and arterial systems. Review of: AMI, Angina, Valve disease, Heart Failure, Cardiac Arrhythmia, Pacemakers, and the ICD.   Cardiac Procedures: - Group verbal and written instruction and models to describe the testing methods done to diagnose heart disease. Reviews the outcomes of the test results. Describes the treatment choices: Medical Management, Angioplasty, or Coronary Bypass Surgery.   Cardiac Medications: - Group verbal and written instruction to review commonly prescribed medications for heart disease. Reviews the medication, class of the drug, and side effects. Includes the steps to properly store meds and maintain the prescription regimen. Flowsheet Row Cardiac Rehab from 07/20/2016 in [facility] Cardiac and Pulmonary Rehab  Date  06/22/16  Educator  SB  Instruction Review Code  2- meets goals/outcomes [cardiac meds lecture 2]      Go Sex-Intimacy & Heart Disease, Get SMART - Goal Setting: - Group verbal and written instruction through game format to discuss  heart disease and the return to sexual intimacy. Provides group verbal and written material to discuss and apply goal setting through the application of the S.M.A.R.T. Method.   Other Matters of the Heart: - Provides group verbal, written materials and models to describe Heart Failure, Angina, Valve Disease, and Diabetes in the realm of heart disease. Includes description of the disease process and treatment options available to the cardiac patient.   Exercise & Equipment Safety: - Individual verbal instruction and demonstration of equipment use and safety with use of the equipment. Flowsheet Row Cardiac Rehab from 07/20/2016 in [facility] Cardiac and Pulmonary Rehab  Date  06/10/16  Educator  C.EnterkinRN  Instruction Review Code  1- partially meets, needs review/practice      Infection Prevention: - Provides verbal and written  material to individual with discussion of infection control including proper hand washing and proper equipment cleaning during exercise session. Flowsheet Row Cardiac Rehab from 07/20/2016 in [facility] Cardiac and Pulmonary Rehab  Date  06/10/16  Educator  C. EnterkinRN [facility] 0.1so reinforced to use wipes to clean exercise equipmen]  Instruction Review Code  2- meets goals/outcomes      Falls Prevention: - Provides verbal and written material to individual with discussion of falls prevention and safety. Flowsheet Row Cardiac Rehab from 07/20/2016 in [facility] Cardiac and Pulmonary Rehab  Date  06/10/16  Educator  C. [place]  Instruction Review Code  2- meets goals/outcomes      Diabetes: - Individual verbal and written instruction to review signs/symptoms of diabetes, desired ranges of glucose level fasting, after meals and with exercise. Advice that pre and post exercise glucose checks will be done for 3 sessions at entry of program. [place] from 07/20/2016 in [facility] Cardiac and Pulmonary Rehab  Date  06/10/16  Educator  C. [place]  Instruction Review Code  1- partially meets, needs review/practice       Knowledge Questionnaire Score:     Knowledge Questionnaire Score - 06/10/16 1307      Knowledge Questionnaire Score   Pre Score 19      Core Components/Risk Factors/Patient Goals at Admission:     Personal Goals and Risk Factors at Admission - 06/10/16 1305      Core Components/Risk Factors/Patient Goals on Admission   Sedentary Yes  Quantae said he does walk some.    Intervention Provide advice, education, support and counseling about physical activity/exercise needs.;Develop an individualized exercise prescription for aerobic and resistive training based on initial evaluation findings, risk stratification, comorbidities and participant's personal goals.   Expected Outcomes Achievement of increased cardiorespiratory fitness and enhanced flexibility, muscular  endurance and strength shown through measurements of functional capacity and personal statement of participant.   Diabetes Yes   Intervention Provide education about signs/symptoms and action to take for hypo/hyperglycemia.;Provide education about proper nutrition, including hydration, and aerobic/resistive exercise prescription along with prescribed medications to achieve blood glucose in normal ranges: Fasting glucose 65-99 mg/dL   Expected Outcomes Short Term: Participant verbalizes understanding of the signs/symptoms and immediate care of hyper/hypoglycemia, proper foot care and importance of medication, aerobic/resistive exercise and nutrition plan for blood glucose control.;Long Term: Attainment of HbA1C < 7%.   Hypertension Yes   Intervention Provide education on lifestyle modifcations including regular physical activity/exercise, weight management, moderate sodium restriction and increased consumption of fresh fruit, vegetables, and low fat dairy, alcohol moderation, and smoking cessation.;Monitor prescription use compliance.   Expected Outcomes Short Term: Continued assessment and intervention until BP is < 140/23m  HG in hypertensive participants. < 130/61m HG in hypertensive participants with diabetes, heart failure or chronic kidney disease.;Long Term: Maintenance of blood pressure at goal levels.      Core Components/Risk Factors/Patient Goals Review:      Goals and Risk Factor Review    Row Name 06/17/16 0934 07/08/16 1046           Core Components/Risk Factors/Patient Goals Review   Personal Goals Review Sedentary;Increase Strength and Stamina;Improve shortness of breath with ADL's;Hypertension;Diabetes Diabetes;Hypertension;Sedentary;Increase Strength and Stamina      Review LLadanianis off to good start with program.  He is already able to tell a difference in how he is feeling.  He was able to use his push mower the other day.  His blood sugars have been stable.  His blood  pressures have a stable pattern of being higher in the morning and lower by the afternoon. LThorntonenjoyed his vacation and his grandkids.  As a result, his blood sugar has been a little elevated, but he is getting back on track.  He did walk on the beach daily while away on vacation.  His blood pressure have been the 130s/70s.      Expected Outcomes We will continue to work with LNancee Literin the progream and watching his blood pressures.  He will continue to feel better and stronger. LStevenswill get back on his heart healthy diet.  He will continue to come to exercise and education classes.  We will talk about home exercise guidelines soon.         Core Components/Risk Factors/Patient Goals at Discharge (Final Review):      Goals and Risk Factor Review - 07/08/16 1046      Core Components/Risk Factors/Patient Goals Review   Personal Goals Review Diabetes;Hypertension;Sedentary;Increase Strength and Stamina   Review LAshanteenjoyed his vacation and his grandkids.  As a result, his blood sugar has been a little elevated, but he is getting back on track.  He did walk on the beach daily while away on vacation.  His blood pressure have been the 130s/70s.   Expected Outcomes LSuhailwill get back on his heart healthy diet.  He will continue to come to exercise and education classes.  We will talk about home exercise guidelines soon.      ITP Comments:     ITP Comments    Row Name 06/10/16 1310 06/24/16 0753 07/22/16 0752       ITP Comments LSaahilis going to check with the VHaenasince he has a $45copay for each Cardiac REhab visit. Reinforced for Lenny to clean his exercise equipment before use since he reports his WBC has been 0.1 and he is at risk for infections. LDoytsaid he will talk to the Cardiac REhab registered dietician since it has been about 16 years since he met with one.  30 day review. Continue with ITP 30 day review. Continue with ITP unless changes noted by Medical Director at signature  of review.        Comments:

## 2016-07-24 ENCOUNTER — Encounter: Payer: Medicare HMO | Admitting: *Deleted

## 2016-07-24 DIAGNOSIS — Z955 Presence of coronary angioplasty implant and graft: Secondary | ICD-10-CM

## 2016-07-24 NOTE — Progress Notes (Signed)
Daily Session Note  Patient Details  Name: Brandon Wilkerson MRN: [id number] Date of Birth: 09-07-1941 Referring Provider:   Flowsheet Row Cardiac Rehab from 06/10/2016 in [facility] Cardiac and Pulmonary Rehab  Referring Provider  Dorthula Perfect Date: 07/24/2016  Check In:     Session Check In - 07/24/16 0852      Check-In   Location [facility]-Cardiac & Pulmonary Rehab   Staff Present Alberteen Sam, MA, ACSM RCEP, Exercise Physiologist;Amanda Oletta Darter, BA, ACSM CEP, Exercise Physiologist;Carroll Enterkin, RN, BSN   Supervising physician immediately available to respond to emergencies See telemetry face sheet for immediately available ER MD   Medication changes reported     No   Fall or balance concerns reported    No   Warm-up and Cool-down Performed on first and last piece of equipment   Resistance Training Performed Yes   VAD Patient? No     Pain Assessment   Currently in Pain? No/denies   Multiple Pain Sites No         Goals Met:  Independence with exercise equipment Exercise tolerated well No report of cardiac concerns or symptoms Strength training completed today  Goals Unmet:  Not Applicable  Comments: Pt able to follow exercise prescription today without complaint.  Will continue to monitor for progression.    Dr. Emily Filbert is Medical Director for [place] and [facility].

## 2016-07-25 ENCOUNTER — Telehealth: Payer: Self-pay | Admitting: Internal Medicine

## 2016-07-25 NOTE — Telephone Encounter (Signed)
x

## 2016-07-27 ENCOUNTER — Encounter: Payer: Medicare HMO | Admitting: *Deleted

## 2016-07-27 DIAGNOSIS — Z955 Presence of coronary angioplasty implant and graft: Secondary | ICD-10-CM | POA: Diagnosis not present

## 2016-07-27 NOTE — Progress Notes (Signed)
Daily Session Note  Patient Details  Name: JAEDEN MESSER MRN: [id number] Date of Birth: [birth year] Referring Provider:   Flowsheet Row Cardiac Rehab from 06/10/2016 in [facility] Cardiac and Pulmonary Rehab  Referring Provider  Dorthula Perfect Date: 07/27/2016  Check In:     Session Check In - 07/27/16 0807      Check-In   Location [facility]-Cardiac & Pulmonary Rehab   Staff Present Heath Lark, RN, BSN, Laveda Norman, BS, ACSM CEP, Exercise Physiologist;Jessica [place], [place], ACSM RCEP, Exercise Physiologist   Supervising physician immediately available to respond to emergencies See telemetry face sheet for immediately available ER MD   Medication changes reported     No   Fall or balance concerns reported    No   Warm-up and Cool-down Performed on first and last piece of equipment   Resistance Training Performed Yes   VAD Patient? No     Pain Assessment   Currently in Pain? No/denies   Multiple Pain Sites No         Goals Met:  Independence with exercise equipment Exercise tolerated well No report of cardiac concerns or symptoms Strength training completed today  Goals Unmet:  Not Applicable  Comments: Pt able to follow exercise prescription today without complaint.  Will continue to monitor for progression.    Dr. Emily Filbert is Medical Director for [place] and [facility].

## 2016-07-29 ENCOUNTER — Encounter: Payer: Medicare HMO | Admitting: *Deleted

## 2016-07-29 DIAGNOSIS — Z955 Presence of coronary angioplasty implant and graft: Secondary | ICD-10-CM | POA: Diagnosis not present

## 2016-07-29 NOTE — Progress Notes (Signed)
Daily Session Note  Patient Details  Name: EDVARDO HONSE MRN: [id number] Date of Birth: 1941/01/25 Referring Provider:   Flowsheet Row Cardiac Rehab from 06/10/2016 in [facility] Cardiac and Pulmonary Rehab  Referring Provider  Dorthula Perfect Date: 07/29/2016  Check In:     Session Check In - 07/29/16 1033      Check-In   Location [facility]-Cardiac & Pulmonary Rehab   Staff Present Nyoka Cowden, RN, BSN, Willette Pa, MA, ACSM RCEP, Exercise Physiologist;Amanda Oletta Darter, [place], ACSM CEP, Exercise Physiologist   Supervising physician immediately available to respond to emergencies See telemetry face sheet for immediately available ER MD   Medication changes reported     No   Fall or balance concerns reported    No   Warm-up and Cool-down Performed on first and last piece of equipment   Resistance Training Performed Yes   VAD Patient? No     Pain Assessment   Currently in Pain? No/denies   Multiple Pain Sites No         Goals Met:  Independence with exercise equipment Exercise tolerated well Personal goals reviewed No report of cardiac concerns or symptoms Strength training completed today  Goals Unmet:  Not Applicable  Comments: Pt able to follow exercise prescription today without complaint.  Will continue to monitor for progression.    Dr. Emily Filbert is Medical Director for [place] and [facility].

## 2016-07-31 ENCOUNTER — Encounter: Payer: Medicare HMO | Admitting: *Deleted

## 2016-07-31 DIAGNOSIS — Z955 Presence of coronary angioplasty implant and graft: Secondary | ICD-10-CM

## 2016-07-31 NOTE — Progress Notes (Signed)
Daily Session Note  Patient Details  Name: Brandon Wilkerson MRN: [id number] Date of Birth: Apr 16, 1941 Referring Provider:   Flowsheet Row Cardiac Rehab from 06/10/2016 in [facility] Cardiac and Pulmonary Rehab  Referring Provider  Dorthula Perfect Date: 07/31/2016  Check In:     Session Check In - 07/31/16 0956      Check-In   Location [facility]-Cardiac & Pulmonary Rehab   Staff Present Heath Lark, RN, BSN, CCRP;Bowman Higbie Luan Pulling, MA, ACSM RCEP, Exercise Physiologist;Mary Kellie Shropshire, RN, BSN, MA;Other   Supervising physician immediately available to respond to emergencies See telemetry face sheet for immediately available ER MD   Medication changes reported     No   Fall or balance concerns reported    No   Warm-up and Cool-down Performed on first and last piece of equipment   Resistance Training Performed Yes   VAD Patient? No     Pain Assessment   Currently in Pain? No/denies   Multiple Pain Sites No           Exercise Prescription Changes - 07/30/16 1400      Exercise Review   Progression Yes     Response to Exercise   Blood Pressure (Admit) 130/70   Blood Pressure (Exercise) 136/64   Blood Pressure (Exit) 126/70   Heart Rate (Admit) 92 bpm   Heart Rate (Exercise) 114 bpm   Heart Rate (Exit) 82 bpm   Rating of Perceived Exertion (Exercise) 11   Symptoms none   Comments Home Exercise Guidelines given 07/10/16   Duration Progress to 45 minutes of aerobic exercise without signs/symptoms of physical distress   Intensity THRR unchanged     Progression   Progression Continue to progress workloads to maintain intensity without signs/symptoms of physical distress.   Average METs 3.3     Resistance Training   Training Prescription Yes   Weight 5 lbs   Reps 10-15     Interval Training   Interval Training No     Treadmill   MPH 3   Grade 1   Minutes 15   METs 3.71     Recumbant Elliptical   Level 3   RPM 60   Minutes 15   METs 3.5     T5 Nustep   Level 5    Minutes 15   METs 2.7     Home Exercise Plan   Plans to continue exercise at Home  walking   Frequency Add 4 additional days to program exercise sessions.      Goals Met:  Independence with exercise equipment Exercise tolerated well No report of cardiac concerns or symptoms Strength training completed today  Goals Unmet:  Not Applicable  Comments: Pt able to follow exercise prescription today without complaint.  Will continue to monitor for progression.    Dr. Emily Filbert is Medical Director for [place] and [facility].

## 2016-08-03 ENCOUNTER — Encounter: Payer: Medicare HMO | Admitting: *Deleted

## 2016-08-03 DIAGNOSIS — Z955 Presence of coronary angioplasty implant and graft: Secondary | ICD-10-CM

## 2016-08-03 NOTE — Progress Notes (Signed)
Daily Session Note  Patient Details  Name: Brandon Wilkerson MRN: [id number] Date of Birth: [birth year] Referring Provider:   Flowsheet Row Cardiac Rehab from 06/10/2016 in [facility] Cardiac and Pulmonary Rehab  Referring Provider  Dorthula Perfect Date: 08/03/2016  Check In:     Session Check In - 08/03/16 0818      Check-In   Location [facility]-Cardiac & Pulmonary Rehab   Staff Present Heath Lark, RN, BSN, Laveda Norman, BS, ACSM CEP, Exercise Physiologist;Jessica [place], [place], ACSM RCEP, Exercise Physiologist   Supervising physician immediately available to respond to emergencies See telemetry face sheet for immediately available ER MD   Medication changes reported     No   Fall or balance concerns reported    No   Warm-up and Cool-down Performed on first and last piece of equipment   Resistance Training Performed Yes   VAD Patient? No     Pain Assessment   Currently in Pain? No/denies   Multiple Pain Sites No         Goals Met:  Independence with exercise equipment Exercise tolerated well No report of cardiac concerns or symptoms Strength training completed today  Goals Unmet:  Not Applicable  Comments: Pt able to follow exercise prescription today without complaint.  Will continue to monitor for progression.    Dr. Emily Filbert is Medical Director for [place] and [facility].

## 2016-08-05 ENCOUNTER — Encounter: Payer: Medicare HMO | Admitting: *Deleted

## 2016-08-05 DIAGNOSIS — Z955 Presence of coronary angioplasty implant and graft: Secondary | ICD-10-CM

## 2016-08-05 NOTE — Progress Notes (Signed)
Daily Session Note  Patient Details  Name: Brandon Wilkerson MRN: [id number] Date of Birth: 06-30-1941 Referring Provider:   Flowsheet Row Cardiac Rehab from 06/10/2016 in [facility] Cardiac and Pulmonary Rehab  Referring Provider  Dorthula Perfect Date: 08/05/2016  Check In:     Session Check In - 08/05/16 0804      Check-In   Location [facility]-Cardiac & Pulmonary Rehab   Staff Present Nyoka Cowden, RN, BSN, Willette Pa, MA, ACSM RCEP, Exercise Physiologist;Amanda Oletta Darter, [place], ACSM CEP, Exercise Physiologist   Supervising physician immediately available to respond to emergencies See telemetry face sheet for immediately available ER MD   Medication changes reported     No   Fall or balance concerns reported    No   Warm-up and Cool-down Performed on first and last piece of equipment   Resistance Training Performed Yes   VAD Patient? No     Pain Assessment   Currently in Pain? No/denies   Multiple Pain Sites No         Goals Met:  Independence with exercise equipment Exercise tolerated well No report of cardiac concerns or symptoms Strength training completed today  Goals Unmet:  Not Applicable  Comments: Pt able to follow exercise prescription today without complaint.  Will continue to monitor for progression.    Dr. Emily Filbert is Medical Director for [place] and [facility].

## 2016-08-06 ENCOUNTER — Inpatient Hospital Stay: Payer: Medicare HMO

## 2016-08-06 ENCOUNTER — Inpatient Hospital Stay (HOSPITAL_BASED_OUTPATIENT_CLINIC_OR_DEPARTMENT_OTHER): Payer: Medicare HMO | Admitting: Internal Medicine

## 2016-08-06 VITALS — BP 147/76 | HR 58 | Temp 95.2°F | Resp 18 | Wt 202.4 lb

## 2016-08-06 DIAGNOSIS — D708 Other neutropenia: Secondary | ICD-10-CM

## 2016-08-06 DIAGNOSIS — G4730 Sleep apnea, unspecified: Secondary | ICD-10-CM

## 2016-08-06 DIAGNOSIS — M129 Arthropathy, unspecified: Secondary | ICD-10-CM

## 2016-08-06 DIAGNOSIS — I060 Rheumatic aortic stenosis: Secondary | ICD-10-CM

## 2016-08-06 DIAGNOSIS — D72819 Decreased white blood cell count, unspecified: Secondary | ICD-10-CM

## 2016-08-06 DIAGNOSIS — D631 Anemia in chronic kidney disease: Secondary | ICD-10-CM | POA: Diagnosis not present

## 2016-08-06 DIAGNOSIS — D709 Neutropenia, unspecified: Secondary | ICD-10-CM

## 2016-08-06 DIAGNOSIS — I10 Essential (primary) hypertension: Secondary | ICD-10-CM

## 2016-08-06 DIAGNOSIS — I129 Hypertensive chronic kidney disease with stage 1 through stage 4 chronic kidney disease, or unspecified chronic kidney disease: Secondary | ICD-10-CM

## 2016-08-06 DIAGNOSIS — I739 Peripheral vascular disease, unspecified: Secondary | ICD-10-CM

## 2016-08-06 DIAGNOSIS — N189 Chronic kidney disease, unspecified: Secondary | ICD-10-CM

## 2016-08-06 DIAGNOSIS — Z79899 Other long term (current) drug therapy: Secondary | ICD-10-CM

## 2016-08-06 DIAGNOSIS — Z7689 Persons encountering health services in other specified circumstances: Secondary | ICD-10-CM

## 2016-08-06 DIAGNOSIS — E119 Type 2 diabetes mellitus without complications: Secondary | ICD-10-CM

## 2016-08-06 DIAGNOSIS — Z7982 Long term (current) use of aspirin: Secondary | ICD-10-CM

## 2016-08-06 DIAGNOSIS — Z803 Family history of malignant neoplasm of breast: Secondary | ICD-10-CM

## 2016-08-06 DIAGNOSIS — K649 Unspecified hemorrhoids: Secondary | ICD-10-CM

## 2016-08-06 LAB — CBC WITH DIFFERENTIAL/PLATELET
BASOS PCT: 3 %
Basophils Absolute: 0.1 10*3/uL (ref 0–0.1)
Eosinophils Absolute: 0.2 10*3/uL (ref 0–0.7)
Eosinophils Relative: 10 %
HEMATOCRIT: 32.6 % — AB (ref 40.0–52.0)
HEMOGLOBIN: 11.4 g/dL — AB (ref 13.0–18.0)
Lymphocytes Relative: 52 %
Lymphs Abs: 0.9 10*3/uL — ABNORMAL LOW (ref 1.0–3.6)
MCH: 30.2 pg (ref 26.0–34.0)
MCHC: 34.9 g/dL (ref 32.0–36.0)
MCV: 86.4 fL (ref 80.0–100.0)
MONOS PCT: 22 %
Monocytes Absolute: 0.4 10*3/uL (ref 0.2–1.0)
NEUTROS ABS: 0.2 10*3/uL — AB (ref 1.4–6.5)
NEUTROS PCT: 13 %
Platelets: 160 10*3/uL (ref 150–440)
RBC: 3.78 MIL/uL — ABNORMAL LOW (ref 4.40–5.90)
RDW: 15.1 % — ABNORMAL HIGH (ref 11.5–14.5)
WBC: 1.6 10*3/uL — ABNORMAL LOW (ref 3.8–10.6)

## 2016-08-06 MED ORDER — TBO-FILGRASTIM 480 MCG/0.8ML ~~LOC~~ SOSY
480.0000 ug | PREFILLED_SYRINGE | Freq: Once | SUBCUTANEOUS | Status: AC
  Administered 2016-08-06: 480 ug via SUBCUTANEOUS
  Filled 2016-08-06: qty 0.8

## 2016-08-06 NOTE — Telephone Encounter (Signed)
Has attended Cardiac Rehab in the past.

## 2016-08-06 NOTE — Assessment & Plan Note (Addendum)
1. Leukopenia/Neutropenia  of unclear origin-as previously stated, extensive workup including multiple bone marrow biopsies failed to reveal clear etiology of persistent neutropenia.  Currently on granix  2  Every 3 weeks; however patient continues to have extreme/severe neutropenia.  # Recommend changing to Granix every week/weekly CBC. Understands that if this does not work we might have to increase of frequency of the Granix.  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  # CKD  Follow-up with me in 2 months/weekly labs/Granix. Above plan of care was discussed with the patient and family in detail. They agree.

## 2016-08-06 NOTE — Progress Notes (Signed)
[place]  Telephone:(336) [phone] Fax:(336) [phone]     ID: Brandon Wilkerson OB: 23-Aug-1941  MR#: [id number]  CSN#:[id number]  Patient Care Team: Jodi Marble, MD as PCP - General (Internal Medicine)  CHIEF COMPLAINT/DIAGNOSIS:  Leukopenia/Neutropenia, Mild Anemia - Mild, asymptomatic, of unclear etiology. Workup so far unremarkable as detailed below.  Bone marrow biopsy 05/10/13 - no diagnostic morphologic evidence of B-cell lymphoproliferative disorder or other hematopoietic neoplasia identified.  Normocellular to slightly hypercellular marrow for age 73-50% with maturing trilineage hematopoiesis, storage iron present, slight patchy increase in reticulin  Flow study negative for any B-cell monoclonality, nonspecific myeloid and normocytic findings with no increase in blasts,relatively increased eosinophils 7% is nonspecific,relatively increased normal B-cell precursors (hematogones)and.  Normal male karyotype, 46XY.  Labs done on 11/02/12 - Hb 13.3, platelets 172K, retic 0.036, WBC 3400 with 38% neutrophils, 35% lymphocytes, 10% variant lymphocytes, 12% monocytes. Iron study, B12, folate, ANA, serum immunoelectrophoresis (SIEP), PT, PTT, LDH, haptoglobin, Coombs test, HBsAg, HCV antibody, and HIV antibody all unremarkable.   05/03/13 - Peripheral blood Flow Cytometry. A very small (0.3% of leukocytes) B cell clone is detected. Previous phenotyping results showed similar findings. The clone is very small and is not diagnostic of any specific type of lymphoma. If the patient has a previous history of lymphoma, the finding is consistent with persistent involvement by lymphoma. 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  07/23/15 -  Bone Marrow Biopsy Report  -  nonspecific marrow findings with no diagnostic morphologic or immunophenotypic evidence of hematopoietic neoplasia. Mildly hypercellular marrow for age (40-50%) with myeloid hypoplasia,  increased eosinophils, erythroid hyperplasia with mild nonspecific dyserythropoiesis and mild megakaryocytic atypia. Diffuse mild to focally moderate increase in reticulin. Storage iron present. Flow cytometry reports relatively decreased neutrophilic cells with no immunophenotypic abnormalities or increase in blasts, relatively increased eosinophils (17%) nonspecific. Cytogenetics normal 46XY. SNP microarray result is Normal Male. The treatment with short course of steroids was attempted in November 2016, however there was no response. Treatment with G-CSF was initiated in early December 2016  HISTORY OF PRESENT ILLNESS:   75 year old male patient with a history of neutropenia of unclear etiology/ currently on Neupogen daily 2 every 3 weeks is here for follow-up.   Patient has not had any infection/or admission the hospital recently. No weight loss. No night sweats.  REVIEW  OF SYSTEMS:   ROS: As in HPI above. In addition, no new headaches or focal weakness.  No sore throat, cough, shortness of breath, sputum, hemoptysis or chest pain. No abdominal pain, constipation, diarrhea, dysuria or hematuria.   PAST MEDICAL HISTORY: Reviewed.         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 As above.  FAMILY HISTORY: Reviewed. Remarkable for diabetes, heart disease, hypertension, breast cancer.   SOCIAL HISTORY: Reviewed. Nonsmoker.  Denies alcohol or recreational drug usage.    Allergies  Allergen Reactions  . Pollen Extract Other (See Comments)    Runny nose    Current Outpatient Prescriptions  Medication Sig Dispense Refill  . aspirin 81 MG tablet Take 81 mg by mouth daily.    Marland Kitchen atorvastatin (LIPITOR) 80 MG tablet Take 80 mg by mouth daily.    . carvedilol (COREG) 3.125 MG tablet      . cloNIDine (CATAPRES) 0.3  MG tablet Take 0.3 mg by mouth 2 (two) times daily.    . clopidogrel (PLAVIX) 75 MG tablet Take 75 mg by mouth daily.    . Dulaglutide (TRULICITY Brandon Wilkerson) Inject into the skin.    . fluticasone (FLONASE) 50 MCG/ACT nasal spray Place 1 spray into both nostrils daily.    Marland Kitchen gabapentin (NEURONTIN) 300 MG capsule Take 1 capsule (300 mg total) by mouth 3 (three) times daily. 90 capsule 0  . glipiZIDE (GLUCOTROL) 5 MG tablet Take 2.5 mg by mouth 2 (two) times daily.    . isosorbide mononitrate (IMDUR) 60 MG 24 hr tablet Take 60 mg by mouth daily.    . nitroGLYCERIN (NITROSTAT) 0.4 MG SL tablet Place 0.4 mg under the tongue every 5 (five) minutes as needed for chest pain.    . ONE TOUCH ULTRA TEST test strip use as directed;  twice a day OR MORE if needed  1   No current facility-administered medications for this visit.     PHYSICAL EXAM: Vitals:   08/06/16 1112  BP: (!) 147/76  Pulse: (!) 58  Resp: 18  Temp: (!) 95.2 F (35.1 C)     Body mass index is 27.14 kg/m.     BP (!) 147/76 (BP Location: Right Arm, Patient Position: Sitting)   Pulse (!) 58   Temp (!) 95.2 F (35.1 C) (Tympanic)   Resp 18   Wt 202 lb 6 oz (91.8 kg)   BMI 27.14 kg/m   General Appearance:    Alert, cooperative, no distress, appears stated age;  accompanied by his wife   Head:    Normocephalic, without obvious abnormality, atraumatic  Eyes:    PERRL, conjunctiva/corneas clear, EOM's intact, fundi    benign, both eyes       Ears:    Normal TM's and external ear canals, both ears  Nose:   Nares normal, septum midline, mucosa normal, no drainage   or sinus tenderness  Throat:   Lips, mucosa, and tongue normal; teeth and gums normal  Neck:   Supple, symmetrical, trachea midline, no adenopathy;       thyroid:  No enlargement/tenderness/nodules; carotid   bruit on L  Back:     Symmetric, no curvature, ROM normal, no CVA tenderness  Lungs:     Clear to auscultation bilaterally, respirations  unlabored  Chest wall:    No tenderness or deformity  Heart:    Regular rate and rhythm, S1 and S2 normal, systolic ejection murmur 3 out of 6 on aorta   Abdomen:     Soft, non-tender, bowel sounds active all four quadrants,    no masses, no organomegaly  Extremities:   Extremities normal, atraumatic, no cyanosis or edema  Pulses:   2+ and symmetric all extremities  Skin:   Skin color, texture, turgor normal, no rashes or lesions  Lymph nodes:   Cervical, supraclavicular, and axillary nodes normal  Neurologic:   CNII-XII intact. Normal strength, sensation and reflexes      throughout     LAB RESULTS: Recent Results (from the past 2160 hour(s))  CBC with Differential/Platelet     Status: Abnormal   Collection Time: 05/14/16 10:20 AM  Result Value Ref Range   WBC 1.7 (L) 3.8 - 10.6 K/uL   RBC 3.63 (L) 4.40 - 5.90 MIL/uL   Hemoglobin 11.0 (L) 13.0 - 18.0 g/dL   HCT 31.3 (L) 40.0 - 52.0 %   MCV 86.2 80.0 - 100.0 fL   MCH 30.4  26.0 - 34.0 pg   MCHC 35.3 32.0 - 36.0 g/dL   RDW 13.8 11.5 - 14.5 %   Platelets 206 150 - 440 K/uL   Neutrophils Relative % 5 %   Neutro Abs 0.1 (L) 1.7 - 7.7 K/uL    Comment: RESULT REPEATED AND VERIFIED CRITICAL RESULT CALLED TO, READ BACK BY AND VERIFIED WITH: HEATHER JONES AT 0923 05/14/2016 KMR    Lymphocytes Relative 55 %   Lymphs Abs 0.9 (L) 1.0 - 3.6 K/uL   Monocytes Relative 24 %   Monocytes Absolute 0.4 0.2 - 1.0 K/uL   Eosinophils Relative 12 %   Eosinophils Absolute 0.2 0 - 0.7 K/uL   Basophils Relative 4 %   Basophils Absolute 0.1 0 - 0.1 K/uL  Comprehensive metabolic panel     Status: Abnormal   Collection Time: 05/14/16 10:20 AM  Result Value Ref Range   Sodium 135 135 - 145 mmol/L   Potassium 4.4 3.5 - 5.1 mmol/L   Chloride 106 101 - 111 mmol/L   CO2 22 22 - 32 mmol/L   Glucose, Bld 192 (H) 65 - 99 mg/dL   BUN 36 (H) 6 - 20 mg/dL   Creatinine, Ser 1.92 (H) 0.61 - 1.24 mg/dL   Calcium 9.0 8.9 - 10.3 mg/dL   Total Protein 7.2 6.5 -  8.1 g/dL   Albumin 4.2 3.5 - 5.0 g/dL   AST 19 15 - 41 U/L   ALT 13 (L) 17 - 63 U/L   Alkaline Phosphatase 89 38 - 126 U/L   Total Bilirubin 0.6 0.3 - 1.2 mg/dL   GFR calc non Af Amer 33 (L) >60 mL/min   GFR calc Af Amer 39 (L) >60 mL/min    Comment: (NOTE) The eGFR has been calculated using the CKD EPI equation. This calculation has not been validated in all clinical situations. eGFR's persistently <60 mL/min signify possible Chronic Kidney Disease.    Anion gap 7 5 - 15  Lactate dehydrogenase     Status: None   Collection Time: 05/14/16 10:20 AM  Result Value Ref Range   LDH 156 98 - 192 U/L  Iron and TIBC     Status: Abnormal   Collection Time: 05/14/16 10:20 AM  Result Value Ref Range   Iron 60 45 - 182 ug/dL   TIBC 356 250 - 450 ug/dL   Saturation Ratios 17 (L) 17.9 - 39.5 %   UIBC 296 ug/dL  Ferritin     Status: None   Collection Time: 05/14/16 10:20 AM  Result Value Ref Range   Ferritin 55 24 - 336 ng/mL  CBC with Differential     Status: Abnormal   Collection Time: 06/04/16 10:52 AM  Result Value Ref Range   WBC 2.2 (L) 3.8 - 10.6 K/uL   RBC 3.41 (L) 4.40 - 5.90 MIL/uL   Hemoglobin 10.3 (L) 13.0 - 18.0 g/dL   HCT 29.4 (L) 40.0 - 52.0 %   MCV 86.1 80.0 - 100.0 fL   MCH 30.2 26.0 - 34.0 pg   MCHC 35.1 32.0 - 36.0 g/dL   RDW 13.7 11.5 - 14.5 %   Platelets 210 150 - 440 K/uL   Neutrophils Relative % 11 %   Neutro Abs 0.2 (L) 1.7 - 7.7 K/uL    Comment: [facility] CRITICAL VALUE PROTOCOL RESULT REPEATED AND VERIFIED    Lymphocytes Relative 45 %   Lymphs Abs 1.0 1.0 - 3.6 K/uL   Monocytes Relative 36 %  Monocytes Absolute 0.8 0.2 - 1.0 K/uL   Eosinophils Relative 6 %   Eosinophils Absolute 0.1 0 - 0.7 K/uL   Basophils Relative 2 %   Basophils Absolute 0.1 0 - 0.1 K/uL  Glucose, capillary     Status: Abnormal   Collection Time: 06/12/16  7:56 AM  Result Value Ref Range   Glucose-Capillary 196 (H) 65 - 99 mg/dL  CBC with Differential     Status: Abnormal     Collection Time: 06/25/16 10:15 AM  Result Value Ref Range   WBC 1.7 (L) 3.8 - 10.6 K/uL   RBC 3.39 (L) 4.40 - 5.90 MIL/uL   Hemoglobin 10.1 (L) 13.0 - 18.0 g/dL   HCT 29.4 (L) 40.0 - 52.0 %   MCV 86.8 80.0 - 100.0 fL   MCH 30.0 26.0 - 34.0 pg   MCHC 34.5 32.0 - 36.0 g/dL   RDW 14.5 11.5 - 14.5 %   Platelets 172 150 - 440 K/uL   Neutrophils Relative % 9% %   Neutro Abs 0.1 (L) 1.4 - 6.5 K/uL    Comment: RESULT REPEATED AND VERIFIED CRITICAL RESULT CALLED TO, READ BACK BY AND VERIFIED WITH: ANITA BLACK AT 1044 06/25/2016 KMR    Lymphocytes Relative 50% %   Lymphs Abs 0.8 (L) 1.0 - 3.6 K/uL   Monocytes Relative 31% %   Monocytes Absolute 0.5 0.2 - 1.0 K/uL   Eosinophils Relative 7% %   Eosinophils Absolute 0.1 0 - 0.7 K/uL   Basophils Relative 3% %   Basophils Absolute 0.1 0 - 0.1 K/uL  CBC with Differential     Status: Abnormal   Collection Time: 07/16/16 10:06 AM  Result Value Ref Range   WBC 2.0 (L) 3.8 - 10.6 K/uL   RBC 3.46 (L) 4.40 - 5.90 MIL/uL   Hemoglobin 10.3 (L) 13.0 - 18.0 g/dL   HCT 30.1 (L) 40.0 - 52.0 %   MCV 87.0 80.0 - 100.0 fL   MCH 29.8 26.0 - 34.0 pg   MCHC 34.2 32.0 - 36.0 g/dL   RDW 14.8 (H) 11.5 - 14.5 %   Platelets 188 150 - 440 K/uL   Neutrophils Relative % 6% %   Neutro Abs 0.1 (L) 1.4 - 6.5 K/uL    Comment: RESULT REPEATED AND VERIFIED CRITICAL RESULT CALLED TO, READ BACK BY AND VERIFIED WITH: ANITA BLACK AT 1017 07/16/2016 KMR    Lymphocytes Relative 39% %   Lymphs Abs 0.8 (L) 1.0 - 3.6 K/uL   Monocytes Relative 49% %   Monocytes Absolute 1.0 0.2 - 1.0 K/uL   Eosinophils Relative 3% %   Eosinophils Absolute 0.1 0 - 0.7 K/uL   Basophils Relative 3% %   Basophils Absolute 0.0 0 - 0.1 K/uL  CBC with Differential     Status: Abnormal   Collection Time: 08/06/16 10:15 AM  Result Value Ref Range   WBC 1.6 (L) 3.8 - 10.6 K/uL   RBC 3.78 (L) 4.40 - 5.90 MIL/uL   Hemoglobin 11.4 (L) 13.0 - 18.0 g/dL   HCT 32.6 (L) 40.0 - 52.0 %   MCV 86.4  80.0 - 100.0 fL   MCH 30.2 26.0 - 34.0 pg   MCHC 34.9 32.0 - 36.0 g/dL   RDW 15.1 (H) 11.5 - 14.5 %   Platelets 160 150 - 440 K/uL   Neutrophils Relative % 13 %   Neutro Abs 0.2 (L) 1.4 - 6.5 K/uL    Comment: CRITICAL RESULT CALLED TO, READ BACK  BY AND VERIFIED WITH: BRENDA ELLINGTON ON 08/06/16 AT 1037 QSD    Lymphocytes Relative 52 %   Lymphs Abs 0.9 (L) 1.0 - 3.6 K/uL   Monocytes Relative 22 %   Monocytes Absolute 0.4 0.2 - 1.0 K/uL   Eosinophils Relative 10 %   Eosinophils Absolute 0.2 0 - 0.7 K/uL   Basophils Relative 3 %   Basophils Absolute 0.1 0 - 0.1 K/uL     STUDIES: No results found. 07/23/15 -  Bone Marrow Biopsy Report  -  nonspecific marrow findings with no diagnostic morphologic or immunophenotypic evidence of hematopoietic neoplasia. Mildly hypercellular marrow for age (40-50%) with myeloid hypoplasia, increased eosinophils, erythroid hyperplasia with mild nonspecific dyserythropoiesis and mild megakaryocytic atypia. Diffuse mild to focally moderate increase in reticulin. Storage iron present. Flow cytometry reports relatively decreased neutrophilic cells with no immunophenotypic abnormalities or increase in blasts, relatively increased eosinophils (17%) nonspecific. Cytogenetics normal 46XY. SNP microarray result is Normal Male.    ASSESSMENT / PLAN:   Neutropenia, unspecified (Brandon Wilkerson) 1. Leukopenia/Neutropenia  of unclear origin-as previously stated, extensive workup including multiple bone marrow biopsies failed to reveal clear etiology of persistent neutropenia.  Currently on granix  2  Every 3 weeks; however patient continues to have extreme/severe neutropenia.  # Recommend changing to Granix every week/weekly CBC. Understands that if this does not work we might have to increase of frequency of the Granix.  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  # CKD  Follow-up with me in 2 months/weekly labs/Granix. Above plan of care was discussed with  the patient and family in detail. They agree.     Cammie Sickle, MD   08/06/2016 1:43 PM

## 2016-08-07 ENCOUNTER — Inpatient Hospital Stay: Payer: Medicare HMO

## 2016-08-07 ENCOUNTER — Encounter: Payer: Medicare HMO | Attending: Cardiovascular Disease | Admitting: *Deleted

## 2016-08-07 DIAGNOSIS — Z955 Presence of coronary angioplasty implant and graft: Secondary | ICD-10-CM | POA: Insufficient documentation

## 2016-08-07 NOTE — Progress Notes (Signed)
Daily Session Note  Patient Details  Name: Brandon Wilkerson MRN: [id number] Date of Birth: 1941-06-08 Referring Provider:   Flowsheet Row Cardiac Rehab from 06/10/2016 in [facility] Cardiac and Pulmonary Rehab  Referring Provider  Dorthula Perfect Date: 08/07/2016  Check In:     Session Check In - 08/07/16 0813      Check-In   Location [facility]-Cardiac & Pulmonary Rehab   Staff Present Alberteen Sam, MA, ACSM RCEP, Exercise Physiologist;Amanda Oletta Darter, BA, ACSM CEP, Exercise Physiologist;Carroll Enterkin, RN, BSN   Supervising physician immediately available to respond to emergencies See telemetry face sheet for immediately available ER MD   Medication changes reported     No   Fall or balance concerns reported    No   Warm-up and Cool-down Performed on first and last piece of equipment   Resistance Training Performed Yes   VAD Patient? No     Pain Assessment   Currently in Pain? No/denies   Multiple Pain Sites No         Goals Met:  Independence with exercise equipment Exercise tolerated well No report of cardiac concerns or symptoms Strength training completed today  Goals Unmet:  Not Applicable  Comments: Pt able to follow exercise prescription today without complaint.  Will continue to monitor for progression.    Dr. Emily Filbert is Medical Director for [facility].

## 2016-08-12 ENCOUNTER — Encounter: Payer: Medicare HMO | Admitting: *Deleted

## 2016-08-12 VITALS — Ht 72.4 in | Wt 202.0 lb

## 2016-08-12 DIAGNOSIS — Z955 Presence of coronary angioplasty implant and graft: Secondary | ICD-10-CM | POA: Diagnosis not present

## 2016-08-12 NOTE — Progress Notes (Signed)
Daily Session Note  Patient Details  Name: Brandon Wilkerson MRN: [id number] Date of Birth: Feb 19, 1941 Referring Provider:   Flowsheet Row Cardiac Rehab from 06/10/2016 in [facility] Cardiac and Pulmonary Rehab  Referring Provider  Dorthula Perfect Date: 08/12/2016  Check In:     Session Check In - 08/12/16 0911      Check-In   Location [facility]-Cardiac & Pulmonary Rehab   Staff Present Alberteen Sam, MA, ACSM RCEP, Exercise Physiologist;Susanne Bice, RN, BSN, Lance Sell, BA, ACSM CEP, Exercise Physiologist   Supervising physician immediately available to respond to emergencies See telemetry face sheet for immediately available ER MD   Medication changes reported     No   Fall or balance concerns reported    No   Warm-up and Cool-down Performed on first and last piece of equipment   Resistance Training Performed Yes   VAD Patient? No     Pain Assessment   Currently in Pain? No/denies   Multiple Pain Sites No         Goals Met:  Independence with exercise equipment Exercise tolerated well No report of cardiac concerns or symptoms Strength training completed today  Goals Unmet:  Not Applicable  Comments: Pt able to follow exercise prescription today without complaint.  Will continue to monitor for progression.      [place] Name 06/10/16 1230 08/12/16 0917       6 Minute Walk   Phase Initial Discharge    Distance 1545 feet 1786 feet    Distance % Change  - 15.6 %    Walk Time 6 minutes 6 minutes    # of Rest Breaks 0 0    MPH 2.9 3.38    METS 3.5 4.03    RPE 13 11    VO2 Peak 12.3 14.1    Symptoms No No    Resting HR 70 bpm 66 bpm    Resting BP 132/80 106/56    Max Ex. HR 95 bpm 114 bpm    Max Ex. BP 164/60 148/62        Dr. Emily Filbert is Medical Director for [facility].

## 2016-08-13 ENCOUNTER — Ambulatory Visit: Payer: Medicare HMO

## 2016-08-13 ENCOUNTER — Other Ambulatory Visit: Payer: Self-pay

## 2016-08-13 ENCOUNTER — Ambulatory Visit: Payer: Medicare HMO | Admitting: Internal Medicine

## 2016-08-13 ENCOUNTER — Inpatient Hospital Stay: Payer: Medicare HMO | Attending: Internal Medicine

## 2016-08-13 ENCOUNTER — Other Ambulatory Visit: Payer: Medicare HMO

## 2016-08-13 ENCOUNTER — Inpatient Hospital Stay: Payer: Medicare HMO

## 2016-08-13 DIAGNOSIS — D709 Neutropenia, unspecified: Secondary | ICD-10-CM | POA: Insufficient documentation

## 2016-08-13 DIAGNOSIS — D708 Other neutropenia: Secondary | ICD-10-CM

## 2016-08-13 DIAGNOSIS — Z7689 Persons encountering health services in other specified circumstances: Secondary | ICD-10-CM | POA: Insufficient documentation

## 2016-08-13 LAB — CBC WITH DIFFERENTIAL/PLATELET
Basophils Absolute: 0 10*3/uL (ref 0–0.1)
Basophils Relative: 2 %
EOS ABS: 0.2 10*3/uL (ref 0–0.7)
Eosinophils Relative: 9 %
HEMATOCRIT: 28.7 % — AB (ref 40.0–52.0)
HEMOGLOBIN: 10 g/dL — AB (ref 13.0–18.0)
LYMPHS ABS: 0.8 10*3/uL — AB (ref 1.0–3.6)
LYMPHS PCT: 44 %
MCH: 29.7 pg (ref 26.0–34.0)
MCHC: 34.9 g/dL (ref 32.0–36.0)
MCV: 85.3 fL (ref 80.0–100.0)
MONOS PCT: 15 %
Monocytes Absolute: 0.3 10*3/uL (ref 0.2–1.0)
NEUTROS ABS: 0.6 10*3/uL — AB (ref 1.4–6.5)
NEUTROS PCT: 30 %
Platelets: 131 10*3/uL — ABNORMAL LOW (ref 150–440)
RBC: 3.36 MIL/uL — AB (ref 4.40–5.90)
RDW: 15.2 % — ABNORMAL HIGH (ref 11.5–14.5)
WBC: 1.9 10*3/uL — AB (ref 3.8–10.6)

## 2016-08-13 MED ORDER — TBO-FILGRASTIM 480 MCG/0.8ML ~~LOC~~ SOSY
480.0000 ug | PREFILLED_SYRINGE | Freq: Once | SUBCUTANEOUS | Status: AC
  Administered 2016-08-13: 480 ug via SUBCUTANEOUS
  Filled 2016-08-13: qty 0.8

## 2016-08-14 ENCOUNTER — Ambulatory Visit: Payer: Medicare HMO | Admitting: Internal Medicine

## 2016-08-14 ENCOUNTER — Encounter: Payer: Medicare HMO | Admitting: *Deleted

## 2016-08-14 DIAGNOSIS — Z955 Presence of coronary angioplasty implant and graft: Secondary | ICD-10-CM | POA: Diagnosis not present

## 2016-08-14 NOTE — Patient Instructions (Signed)
Discharge Instructions  Patient Details  Name: Brandon Wilkerson MRN: [id number] Date of Birth: 10/26/1941 Referring Provider:  Laurier NancyKhan, Shaukat A, MD   Number of Visits: [phone]  Reason for Discharge:  Patient reached a stable level of exercise. Patient independent in their exercise.  Smoking History:  History  Smoking Status  . Former Smoker  . Types: Cigarettes  . Quit date: 11/22/1979  Smokeless Tobacco  . Never Used    Diagnosis:  Status post coronary artery stent placement  Initial Exercise Prescription:     Initial Exercise Prescription - 06/10/16 1200      Date of Initial Exercise RX and Referring Provider   Date 06/10/16   Referring Provider Welton FlakesKhan     Treadmill   MPH 2.5   Grade 1   Minutes 15   METs 3.26     Bike   Level 2   Watts 30   Minutes 15   METs 3     Recumbant Bike   Level 2   RPM 60   Watts 30   Minutes 15   METs 3     NuStep   Level 3   Watts 60   Minutes 15   METs 3.2     Recumbant Elliptical   Level 3   RPM 60   Watts 60   Minutes 15   METs 3.2     Elliptical   Level 1   Speed 50   Minutes 15   METs 3     REL-XR   Level 3   Watts 50   Minutes 15   METs 3     T5 Nustep   Level 3   Watts 50   Minutes 15   METs 3     Biostep-RELP   Level 3   Watts 60   Minutes 15   METs 3.2     Prescription Details   Frequency (times per week) 3   Duration Progress to 30 minutes of continuous aerobic without signs/symptoms of physical distress     Intensity   THRR 40-80% of Max Heartrate 101-132   Ratings of Perceived Exertion 11-15   Perceived Dyspnea 0-4     Progression   Progression Continue to progress workloads to maintain intensity without signs/symptoms of physical distress.     Resistance Training   Training Prescription Yes   Weight 3   Reps 10-15      Discharge Exercise Prescription (Final Exercise Prescription Changes):     Exercise Prescription Changes - 08/12/16 1600      Exercise Review   Progression Yes     Response to Exercise   Blood Pressure (Admit) 106/56   Blood Pressure (Exercise) 148/62   Blood Pressure (Exit) 126/60   Heart Rate (Admit) 77 bpm   Heart Rate (Exercise) 114 bpm   Heart Rate (Exit) 72 bpm   Rating of Perceived Exertion (Exercise) 11   Symptoms none   Comments Home Exercise Guidelines given 07/10/16   Duration Progress to 45 minutes of aerobic exercise without signs/symptoms of physical distress   Intensity THRR unchanged     Progression   Progression Continue to progress workloads to maintain intensity without signs/symptoms of physical distress.   Average METs 3.78     Resistance Training   Training Prescription Yes   Weight 5 lbs   Reps 10-15     Interval Training   Interval Training No     Treadmill   MPH 3.2   Grade  2   Minutes 15   METs 4.33     Recumbant Elliptical   Level 4.2   RPM 60   Minutes 15   METs 4.3     T5 Nustep   Level 5   Minutes 15   METs 2.7     Home Exercise Plan   Plans to continue exercise at Home  walking   Frequency Add 4 additional days to program exercise sessions.      Functional Capacity:     6 Minute Walk    Row Name 06/10/16 1230 08/12/16 0917       6 Minute Walk   Phase Initial Discharge    Distance 1545 feet 1786 feet    Distance % Change  - 15.6 %    Walk Time 6 minutes 6 minutes    # of Rest Breaks 0 0    MPH 2.9 3.38    METS 3.5 4.03    RPE 13 11    VO2 Peak 12.3 14.1    Symptoms No No    Resting HR 70 bpm 66 bpm    Resting BP 132/80 106/56    Max Ex. HR 95 bpm 114 bpm    Max Ex. BP 164/60 148/62       Quality of Life:     Quality of Life - 08/05/16 0806      Quality of Life Scores   Health/Function Pre 25.71 %   Health/Function Post 25.4 %   Health/Function % Change -1.21 %   Socioeconomic Pre 30 %   Socioeconomic Post 27.75 %   Socioeconomic % Change  -7.5 %   Psych/Spiritual Pre 30 %   Psych/Spiritual Post 30 %   Psych/Spiritual % Change 0 %   Family  Pre 30 %   Family Post 27.6 %   Family % Change -8 %   GLOBAL Pre 28.13 %   GLOBAL Post 27.14 %   GLOBAL % Change -3.52 %      Personal Goals: Goals established at orientation with interventions provided to work toward goal.     Personal Goals and Risk Factors at Admission - 06/10/16 1305      Core Components/Risk Factors/Patient Goals on Admission   Sedentary Yes  Brandon Wilkerson said he does walk some.    Intervention Provide advice, education, support and counseling about physical activity/exercise needs.;Develop an individualized exercise prescription for aerobic and resistive training based on initial evaluation findings, risk stratification, comorbidities and participant's personal goals.   Expected Outcomes Achievement of increased cardiorespiratory fitness and enhanced flexibility, muscular endurance and strength shown through measurements of functional capacity and personal statement of participant.   Diabetes Yes   Intervention Provide education about signs/symptoms and action to take for hypo/hyperglycemia.;Provide education about proper nutrition, including hydration, and aerobic/resistive exercise prescription along with prescribed medications to achieve blood glucose in normal ranges: Fasting glucose 65-99 mg/dL   Expected Outcomes Short Term: Participant verbalizes understanding of the signs/symptoms and immediate care of hyper/hypoglycemia, proper foot care and importance of medication, aerobic/resistive exercise and nutrition plan for blood glucose control.;Long Term: Attainment of HbA1C < 7%.   Hypertension Yes   Intervention Provide education on lifestyle modifcations including regular physical activity/exercise, weight management, moderate sodium restriction and increased consumption of fresh fruit, vegetables, and low fat dairy, alcohol moderation, and smoking cessation.;Monitor prescription use compliance.   Expected Outcomes Short Term: Continued assessment and intervention  until BP is < 140/31mm HG in hypertensive participants. < 130/94mm HG  in hypertensive participants with diabetes, heart failure or chronic kidney disease.;Long Term: Maintenance of blood pressure at goal levels.       Personal Goals Discharge:     Goals and Risk Factor Review - 07/29/16 1033      Core Components/Risk Factors/Patient Goals Review   Personal Goals Review Diabetes;Hypertension;Increase Strength and Stamina   Review Brandon Wilkerson has been working hard in rehab and at home.  The Trulicity seems to be improving his blood sugars.  His blood pressure have been steady.  He feels that the program has helped him feel much better overall.   Expected Outcomes Brandon Wilkerson is nearing graduation and will continue to work hard.  We will continue to monitor his blood pressure during exercise.      Nutrition & Weight - Outcomes:     Pre Biometrics - 06/10/16 1228      Pre Biometrics   Height 6' 0.4" (1.839 m)   Weight 201 lb 12.8 oz (91.5 kg)   Waist Circumference 40.5 inches   Hip Circumference 42 inches   Waist to Hip Ratio 0.96 %   BMI (Calculated) 27.1   Single Leg Stand 30 seconds         Post Biometrics - 08/12/16 7829       Post  Biometrics   Height 6' 0.4" (1.839 m)   Weight 202 lb (91.6 kg)   Waist Circumference 40.5 inches   Hip Circumference 42 inches   Waist to Hip Ratio 0.96 %   BMI (Calculated) 27.2   Single Leg Stand 30 seconds      Nutrition:     Nutrition Therapy & Goals - 07/10/16 1045      Nutrition Therapy   Diet Instructed patient on a heart healthy meal plan including dietary guidelines for diabetes.  Meal plan is based on 2000 calories as patient wants to lose a few pounds.   Drug/Food Interactions Statins/Certain Fruits   Protein (specify units) 8   Fiber 30 grams   Whole Grain Foods 3 servings   Saturated Fats 13 max. grams   Fruits and Vegetables 5 servings/day  Patient loves fruit and reports he eats larger portions than recommended on diabetes  meal plan.   Sodium 2000 grams  1500mg  ideal     Personal Nutrition Goals   Personal Goal #1 Read labels for saturated fat, trans fat and sodium   Personal Goal #2 Be more aware of portion control especially of starches and fruits.   Personal Goal #3 Include at least 8 oz of protein daily.   Comments Overall, patient makes healthy food choices. He states he just needs to "get back on track" with portion control.     Intervention Plan   Intervention Prescribe, educate and counsel regarding individualized specific dietary modifications aiming towards targeted core components such as weight, hypertension, lipid management, diabetes, heart failure and other comorbidities.;Nutrition handout(s) given to patient.   Expected Outcomes Short Term Goal: Understand basic principles of dietary content, such as calories, fat, sodium, cholesterol and nutrients.;Short Term Goal: A plan has been developed with personal nutrition goals set during dietitian appointment.;Long Term Goal: Adherence to prescribed nutrition plan.      Nutrition Discharge:     Nutrition Assessments - 08/05/16 0806      Rate Your Plate Scores   Post Score 65   Post Score % 72 %      Education Questionnaire Score:     Knowledge Questionnaire Score - 08/05/16 1023  Knowledge Questionnaire Score   Pre Score 19/28   Post Score 25/28      Goals reviewed with patient; copy given to patient.

## 2016-08-14 NOTE — Progress Notes (Signed)
Daily Session Note  Patient Details  Name: Brandon Wilkerson MRN: [id number] Date of Birth: 1941-05-05 Referring Provider:   Flowsheet Row Cardiac Rehab from 06/10/2016 in [facility]  Referring Provider  Dorthula Perfect Date: 08/14/2016  Check In:     Session Check In - 08/14/16 0842      Check-In   Location [facility]   Staff Present Gerlene Burdock, RN, Levie Heritage, MA, ACSM RCEP, Exercise Physiologist   Supervising physician immediately available to respond to emergencies See telemetry face sheet for immediately available ER MD   Medication changes reported     No   Fall or balance concerns reported    No   Warm-up and Cool-down Performed on first and last piece of equipment   Resistance Training Performed Yes   VAD Patient? No     Pain Assessment   Currently in Pain? No/denies         Goals Met:  Proper associated with RPD/PD & O2 Sat No report of cardiac concerns or symptoms  Goals Unmet:  Not Applicable  Comments:     Dr. Emily Filbert is Medical Director for [place] and [facility].

## 2016-08-17 ENCOUNTER — Encounter: Payer: Medicare HMO | Admitting: *Deleted

## 2016-08-17 DIAGNOSIS — Z955 Presence of coronary angioplasty implant and graft: Secondary | ICD-10-CM

## 2016-08-17 NOTE — Progress Notes (Signed)
Daily Session Note  Patient Details  Name: Brandon Wilkerson MRN: [id number] Date of Birth: 07/02/1941 Referring Provider:   Flowsheet Row Cardiac Rehab from 06/10/2016 in [facility]  Referring Provider  Dorthula Perfect Date: 08/17/2016  Check In:     Session Check In - 08/17/16 0805      Check-In   Location [facility]   Staff Present Gerlene Burdock, RN, Moises Blood, BS, ACSM CEP, Exercise Physiologist;Jessica Luan [place], [place], ACSM RCEP, Exercise Physiologist   Supervising physician immediately available to respond to emergencies See telemetry face sheet for immediately available ER MD   Medication changes reported     No   Fall or balance concerns reported    No   Warm-up and Cool-down Performed on first and last piece of equipment   Resistance Training Performed Yes   VAD Patient? No     Pain Assessment   Currently in Pain? No/denies   Multiple Pain Sites No         Goals Met:  Independence with exercise equipment Exercise tolerated well No report of cardiac concerns or symptoms Strength training completed today  Goals Unmet:  Not Applicable  Comments:  Brandon Wilkerson graduated today from cardiac rehab with 36 sessions completed.  Details of the patient's exercise prescription and what he needs to do in order to continue the prescription and progress were discussed with patient.  Patient was given a copy of prescription and goals.  Patient verbalized understanding.  Brandon Wilkerson plans to continue to exercise by walking 2 miles a day at home and joining the community fitness center.    Dr. Emily Filbert is Medical Director for [place] [facility].

## 2016-08-17 NOTE — Progress Notes (Signed)
Cardiac Individual Treatment Plan  Patient Details  Name: Brandon Wilkerson MRN: [id number] Date of Birth: Nov 04, 1941 Referring Provider:   Flowsheet Row Cardiac Rehab from 06/10/2016 in [facility] Cardiac and Pulmonary Rehab  Referring Provider  Humphrey Rolls      Initial Encounter Date:  Flowsheet Row Cardiac Rehab from 06/10/2016 in [facility] Cardiac and Pulmonary Rehab  Date  06/10/16  Referring Provider  Humphrey Rolls      Visit Diagnosis: Status post coronary artery stent placement  Patient's Home Medications on Admission:  Current Outpatient Prescriptions:  .  aspirin 81 MG tablet, Take 81 mg by mouth daily., Disp: , Rfl:  .  atorvastatin (LIPITOR) 80 MG tablet, Take 80 mg by mouth daily., Disp: , Rfl:  .  carvedilol (COREG) 3.125 MG tablet, , Disp: , Rfl:  .  cloNIDine (CATAPRES) 0.3 MG tablet, Take 0.3 mg by mouth 2 (two) times daily., Disp: , Rfl:  .  clopidogrel (PLAVIX) 75 MG tablet, Take 75 mg by mouth daily., Disp: , Rfl:  .  Dulaglutide (TRULICITY [place]), Inject into the skin., Disp: , Rfl:  .  fluticasone (FLONASE) 50 MCG/ACT nasal spray, Place 1 spray into both nostrils daily., Disp: , Rfl:  .  gabapentin (NEURONTIN) 300 MG capsule, Take 1 capsule (300 mg total) by mouth 3 (three) times daily., Disp: 90 capsule, Rfl: 0 .  glipiZIDE (GLUCOTROL) 5 MG tablet, Take 2.5 mg by mouth 2 (two) times daily., Disp: , Rfl:  .  isosorbide mononitrate (IMDUR) 60 MG 24 hr tablet, Take 60 mg by mouth daily., Disp: , Rfl:  .  nitroGLYCERIN (NITROSTAT) 0.4 MG SL tablet, Place 0.4 mg under the tongue every 5 (five) minutes as needed for chest pain., Disp: , Rfl:  .  ONE TOUCH ULTRA TEST test strip, use as directed;  twice a day OR MORE if needed, Disp: , Rfl: 1  Past Medical History: Past Medical History:  Diagnosis Date  . Arthritis   . Chronic leukopenia   . Diabetes mellitus without complication ([place])   . Hypertension   . PVD (peripheral vascular disease) ([place])   . Rheumatic aortic stenosis   . Sleep apnea      Tobacco Use: History  Smoking Status  . Former Smoker  . Types: Cigarettes  . Quit date: 11/22/1979  Smokeless Tobacco  . Never Used    Labs: Recent Review Flowsheet Data    Labs for ITP Cardiac and Pulmonary Rehab Latest Ref Rng & Units 04/26/2016   Hemoglobin A1c 4.0 - 6.0 % 6.2(H)       Exercise Target Goals:    Exercise Program Goal: Individual exercise prescription set with THRR, safety & activity barriers. Participant demonstrates ability to understand and report RPE using BORG scale, to self-measure pulse accurately, and to acknowledge the importance of the exercise prescription.  Exercise Prescription Goal: Starting with aerobic activity 30 plus minutes a day, 3 days per week for initial exercise prescription. Provide home exercise prescription and guidelines that participant acknowledges understanding prior to discharge.  Activity Barriers & Risk Stratification:     Activity Barriers & Cardiac Risk Stratification - 06/10/16 1309      Activity Barriers & Cardiac Risk Stratification   Activity Barriers None   Cardiac Risk Stratification Moderate      6 Minute Walk:     6 Minute Walk    Row Name 06/10/16 1230 08/12/16 0917       6 Minute Walk   Phase Initial Discharge    Distance  1545 feet 1786 feet    Distance % Change  - 15.6 %    Walk Time 6 minutes 6 minutes    # of Rest Breaks 0 0    MPH 2.9 3.38    METS 3.5 4.03    RPE 13 11    VO2 Peak 12.3 14.1    Symptoms No No    Resting HR 70 bpm 66 bpm    Resting BP 132/80 106/56    Max Ex. HR 95 bpm 114 bpm    Max Ex. BP 164/60 148/62       Initial Exercise Prescription:     Initial Exercise Prescription - 06/10/16 1200      Date of Initial Exercise RX and Referring Provider   Date 06/10/16   Referring Provider Humphrey Rolls     Treadmill   MPH 2.5   Grade 1   Minutes 15   METs 3.26     Bike   Level 2   Watts 30   Minutes 15   METs 3     Recumbant Bike   Level 2   RPM 60   Watts 30    Minutes 15   METs 3     NuStep   Level 3   Watts 60   Minutes 15   METs 3.2     Recumbant Elliptical   Level 3   RPM 60   Watts 60   Minutes 15   METs 3.2     Elliptical   Level 1   Speed 50   Minutes 15   METs 3     REL-XR   Level 3   Watts 50   Minutes 15   METs 3     T5 Nustep   Level 3   Watts 50   Minutes 15   METs 3     Biostep-RELP   Level 3   Watts 60   Minutes 15   METs 3.2     Prescription Details   Frequency (times per week) 3   Duration Progress to 30 minutes of continuous aerobic without signs/symptoms of physical distress     Intensity   THRR 40-80% of Max Heartrate 101-132   Ratings of Perceived Exertion 11-15   Perceived Dyspnea 0-4     Progression   Progression Continue to progress workloads to maintain intensity without signs/symptoms of physical distress.     Resistance Training   Training Prescription Yes   Weight 3   Reps 10-15      Perform Capillary Blood Glucose checks as needed.  Exercise Prescription Changes:     Exercise Prescription Changes    Row Name 06/17/16 1600 06/30/16 1400 07/10/16 1000 07/15/16 1100 07/30/16 1400     Exercise Review   Progression Yes Yes Yes Yes Yes     Response to Exercise   Blood Pressure (Admit) 140/60 114/62  - 126/64 130/70   Blood Pressure (Exercise) 114/66 142/68  - 152/64 136/64   Blood Pressure (Exit) 124/66 112/60  - 112/64 126/70   Heart Rate (Admit) 59 bpm 63 bpm  - 80 bpm 92 bpm   Heart Rate (Exercise) 118 bpm 113 bpm  - 112 bpm 114 bpm   Heart Rate (Exit) 68 bpm 70 bpm  - 81 bpm 82 bpm   Rating of Perceived Exertion (Exercise) 14 11  - 11 11   Symptoms none none none none none   Comments  -  - Home Exercise Guidelines given  07/10/16 Home Exercise Guidelines given 07/10/16 Home Exercise Guidelines given 07/10/16   Duration Progress to 45 minutes of aerobic exercise without signs/symptoms of physical distress Progress to 45 minutes of aerobic exercise without signs/symptoms of  physical distress Progress to 45 minutes of aerobic exercise without signs/symptoms of physical distress Progress to 45 minutes of aerobic exercise without signs/symptoms of physical distress Progress to 45 minutes of aerobic exercise without signs/symptoms of physical distress   Intensity THRR unchanged THRR unchanged THRR unchanged THRR unchanged THRR unchanged     Progression   Progression Continue to progress workloads to maintain intensity without signs/symptoms of physical distress. Continue to progress workloads to maintain intensity without signs/symptoms of physical distress. Continue to progress workloads to maintain intensity without signs/symptoms of physical distress. Continue to progress workloads to maintain intensity without signs/symptoms of physical distress. Continue to progress workloads to maintain intensity without signs/symptoms of physical distress.   Average METs 2.65 2.98 2.98 3.27 3.3     Resistance Training   Training Prescription Yes Yes Yes Yes Yes   Weight 3 4 4 4 5  lbs   Reps 10-15 10-15 10-15 10-15 10-15     Interval Training   Interval Training No No No No No     Treadmill   MPH 2.5 2.8 2.8 3 3    Grade 1 1 1 1 1    Minutes 15 15 15 15 15    METs 3.26 3.53 3.53 3.71 3.71     Recumbant Elliptical   Level  -  -  - 3 3   RPM  -  -  - 60 60   Minutes  -  -  - 15 15   METs  -  -  - 3.5 3.5     REL-XR   Level 3 3 3   -  -   Minutes 15 15 15   -  -   METs 2.3 2.8 2.8  -  -     T5 Nustep   Level 4 4 4 4 5    Minutes 15 15 15 15 15    METs 2.4 2.6 2.6 2.6 2.7     Home Exercise Plan   Plans to continue exercise at  -  - Maintenance  walking Home  walking Home  walking   Frequency  -  - Add 4 additional days to program exercise sessions. Add 4 additional days to program exercise sessions. Add 4 additional days to program exercise sessions.   Row Name 08/12/16 1600             Exercise Review   Progression Yes         Response to Exercise   Blood  Pressure (Admit) 106/56       Blood Pressure (Exercise) 148/62       Blood Pressure (Exit) 126/60       Heart Rate (Admit) 77 bpm       Heart Rate (Exercise) 114 bpm       Heart Rate (Exit) 72 bpm       Rating of Perceived Exertion (Exercise) 11       Symptoms none       Comments Home Exercise Guidelines given 07/10/16       Duration Progress to 45 minutes of aerobic exercise without signs/symptoms of physical distress       Intensity THRR unchanged         Progression   Progression Continue to progress workloads to maintain intensity without signs/symptoms of physical  distress.       Average METs 3.78         Resistance Training   Training Prescription Yes       Weight 5 lbs       Reps 10-15         Interval Training   Interval Training No         Treadmill   MPH 3.2       Grade 2       Minutes 15       METs 4.33         Recumbant Elliptical   Level 4.2       RPM 60       Minutes 15       METs 4.3         T5 Nustep   Level 5       Minutes 15       METs 2.7         Home Exercise Plan   Plans to continue exercise at Home  walking       Frequency Add 4 additional days to program exercise sessions.          Exercise Comments:     Exercise Comments    Row Name 06/12/16 4235 06/17/16 1619 06/30/16 1404 07/08/16 1049 07/10/16 1035   Exercise Comments First full day of exercise!  Patient was oriented to gym and equipment including functions, settings, policies, and procedures.  Patient's individual exercise prescription and treatment plan were reviewed.  All starting workloads were established based on the results of the 6 minute walk test done at initial orientation visit.  The plan for exercise progression was also introduced and progression will be customized based on patient's performance and goals. Brandon Wilkerson is off to a good start with exercise.  He is already noticing a difference in his energy levels.  He was able to use his push mower over the weekend!  We will  continue to work with Brandon Wilkerson to increase his workloads. Brandon Wilkerson continues to do well with exercise and continues to make progress.  We will continue to monitor for progression. Brandon Wilkerson is walking on is off days.  He has expressed some interest in joing the [facility] upon graduation with his wife through [employer].  He was given information about the program.  He acknowledge that he needs a place to go so that he will stay consisitent with his exercise. Reviewed home exercise with pt today.  Pt plans to walk at home for exercise.  Reviewed THR, pulse, RPE, sign and symptoms, NTG use, and when to call 911 or MD.  Also discussed weather considerations and indoor options.  Pt voiced understanding.   Row Name 07/15/16 1108 07/30/16 1431 08/12/16 0918 08/12/16 1609 08/17/16 0809   Exercise Comments Author is doing great with exercise.  He continues to walk at home on his off days from rehab.  He is now up to 3.5 METs on the recumbent elliptical.  We will continue to monitor for progress. Brandon Wilkerson continues to do well with exercise.  He is now up to level 5 on the T5.  We will continue to monitor for progression. Completed post 6 min walk with a 15.6% increase!! Brandon Wilkerson has done great with exercise.  He walking 2 miles on his days off and hopes to continue upon graduation.  We will continue to monitor. Brandon Wilkerson graduated today from cardiac rehab with 36 sessions completed.  Details of the  patient's exercise prescription and what he needs to do in order to continue the prescription and progress were discussed with patient.  Patient was given a copy of prescription and goals.  Patient verbalized understanding.  Brandon Wilkerson plans to continue to exercise by walking 2 miles a day at home and joing the community fitness center!   Brandon Wilkerson Name 08/17/16 0843           Exercise Comments Reviewed METs average and discussed progression with pt today.          Discharge Exercise Prescription (Final Exercise Prescription  Changes):     Exercise Prescription Changes - 08/12/16 1600      Exercise Review   Progression Yes     Response to Exercise   Blood Pressure (Admit) 106/56   Blood Pressure (Exercise) 148/62   Blood Pressure (Exit) 126/60   Heart Rate (Admit) 77 bpm   Heart Rate (Exercise) 114 bpm   Heart Rate (Exit) 72 bpm   Rating of Perceived Exertion (Exercise) 11   Symptoms none   Comments Home Exercise Guidelines given 07/10/16   Duration Progress to 45 minutes of aerobic exercise without signs/symptoms of physical distress   Intensity THRR unchanged     Progression   Progression Continue to progress workloads to maintain intensity without signs/symptoms of physical distress.   Average METs 3.78     Resistance Training   Training Prescription Yes   Weight 5 lbs   Reps 10-15     Interval Training   Interval Training No     Treadmill   MPH 3.2   Grade 2   Minutes 15   METs 4.33     Recumbant Elliptical   Level 4.2   RPM 60   Minutes 15   METs 4.3     T5 Nustep   Level 5   Minutes 15   METs 2.7     Home Exercise Plan   Plans to continue exercise at Home  walking   Frequency Add 4 additional days to program exercise sessions.      Nutrition:  Target Goals: Understanding of nutrition guidelines, daily intake of sodium <1565m, cholesterol <2060m calories 30% from fat and 7% or less from saturated fats, daily to have 5 or more servings of fruits and vegetables.  Biometrics:     Pre Biometrics - 06/10/16 1228      Pre Biometrics   Height 6' 0.4" (1.839 m)   Weight 201 lb 12.8 oz (91.5 kg)   Waist Circumference 40.5 inches   Hip Circumference 42 inches   Waist to Hip Ratio 0.96 %   BMI (Calculated) 27.1   Single Leg Stand 30 seconds         Post Biometrics - 08/12/16 092355     Post  Biometrics   Height 6' 0.4" (1.839 m)   Weight 202 lb (91.6 kg)   Waist Circumference 40.5 inches   Hip Circumference 42 inches   Waist to Hip Ratio 0.96 %   BMI  (Calculated) 27.2   Single Leg Stand 30 seconds      Nutrition Therapy Plan and Nutrition Goals:     Nutrition Therapy & Goals - 07/10/16 1045      Nutrition Therapy   Diet Instructed patient on a heart healthy meal plan including dietary guidelines for diabetes.  Meal plan is based on 2000 calories as patient wants to lose a few pounds.   Drug/Food Interactions Statins/Certain Fruits   Protein (specify  units) 8   Fiber 30 grams   Whole Grain Foods 3 servings   Saturated Fats 13 max. grams   Fruits and Vegetables 5 servings/day  Patient loves fruit and reports he eats larger portions than recommended on diabetes meal plan.   Sodium 2000 grams  1559m ideal     Personal Nutrition Goals   Personal Goal #1 Read labels for saturated fat, trans fat and sodium   Personal Goal #2 Be more aware of portion control especially of starches and fruits.   Personal Goal #3 Include at least 8 oz of protein daily.   Comments Overall, patient makes healthy food choices. He states he just needs to "get back on track" with portion control.     Intervention Plan   Intervention Prescribe, educate and counsel regarding individualized specific dietary modifications aiming towards targeted core components such as weight, hypertension, lipid management, diabetes, heart failure and other comorbidities.;Nutrition handout(s) given to patient.   Expected Outcomes Short Term Goal: Understand basic principles of dietary content, such as calories, fat, sodium, cholesterol and nutrients.;Short Term Goal: A plan has been developed with personal nutrition goals set during dietitian appointment.;Long Term Goal: Adherence to prescribed nutrition plan.      Nutrition Discharge: Rate Your Plate Scores:     Nutrition Assessments - 08/05/16 0806      Rate Your Plate Scores   Post Score 65   Post Score % 72 %      Nutrition Goals Re-Evaluation:   Psychosocial: Target Goals: Acknowledge presence or absence of  depression, maximize coping skills, provide positive support system. Participant is able to verbalize types and ability to use techniques and skills needed for reducing stress and depression.  Initial Review & Psychosocial Screening:     Initial Psych Review & Screening - 06/10/16 1[place] Yes     Barriers   Psychosocial barriers to participate in program There are no identifiable barriers or psychosocial needs.     Screening Interventions   Interventions Encouraged to exercise      Quality of Life Scores:     Quality of Life - 08/05/16 0806      Quality of Life Scores   Health/Function Pre 25.71 %   Health/Function Post 25.4 %   Health/Function % Change -1.21 %   Socioeconomic Pre 30 %   Socioeconomic Post 27.75 %   Socioeconomic % Change  -7.5 %   Psych/Spiritual Pre 30 %   Psych/Spiritual Post 30 %   Psych/Spiritual % Change 0 %   Family Pre 30 %   Family Post 27.6 %   Family % Change -8 %   GLOBAL Pre 28.13 %   GLOBAL Post 27.14 %   GLOBAL % Change -3.52 %      PHQ-9: Recent Review Flowsheet Data    Depression screen [place]2/9 08/05/2016 06/10/2016   Decreased Interest 0 1   Down, Depressed, Hopeless 0 0   PHQ - 2 Score 0 1   Altered sleeping 0 0   Tired, decreased energy 0 1   Change in appetite 0 0   Feeling bad or failure about yourself  0 0   Trouble concentrating 0 0   Moving slowly or fidgety/restless 0 0   Suicidal thoughts 0 0   PHQ-9 Score 0 2   Difficult doing work/chores Not difficult at all Somewhat difficult      Psychosocial Evaluation and Intervention:  Psychosocial Evaluation - 06/15/16 0931      Psychosocial Evaluation & Interventions   Interventions Encouraged to exercise with the program and follow exercise prescription   Comments Counselor met today with Brandon Wilkerson.  He is a 75 year old gentleman who had a stent inserted on 5/22.  He has a strong support system with a spouse of 71 years  and an adult daughter who lives close by.  Brandon Wilkerson also is actively involved in his local church community.  He has diabetes as well as heart problems, but, otherwise reports he is in good health.  Brandon Wilkerson states he sleeps well, has a good appetite, and denies a history or current symptoms of depression or anxiety.  He reports that his mood is generally positive and he has minimal stress in his life.  His goals for this program are to improve his overall health and increase his longevity.  He plans to continue exercise upon completion of this program in the [employer] program as a follow up.       Psychosocial Re-Evaluation:   Vocational Rehabilitation: Provide vocational rehab assistance to qualifying candidates.   Vocational Rehab Evaluation & Intervention:     Vocational Rehab - 06/10/16 1307      Initial Vocational Rehab Evaluation & Intervention   Assessment shows need for Vocational Rehabilitation No      Education: Education Goals: Education classes will be provided on a weekly basis, covering required topics. Participant will state understanding/return demonstration of topics presented.  Learning Barriers/Preferences:     Learning Barriers/Preferences - 06/10/16 1309      Learning Barriers/Preferences   Learning Barriers None   Learning Preferences None      Education Topics: General Nutrition Guidelines/Fats and Fiber: -Group instruction provided by verbal, written material, models and posters to present the general guidelines for heart healthy nutrition. Gives an explanation and review of dietary fats and fiber.   Controlling Sodium/Reading Food Labels: -Group verbal and written material supporting the discussion of sodium use in heart healthy nutrition. Review and explanation with models, verbal and written materials for utilization of the food label. Flowsheet Row Cardiac Rehab from 08/17/2016 in [facility] Cardiac and Pulmonary Rehab  Date  07/13/16   Educator  CR  Instruction Review Code  2- meets goals/outcomes      Exercise Physiology & Risk Factors: - Group verbal and written instruction with models to review the exercise physiology of the cardiovascular system and associated critical values. Details cardiovascular disease risk factors and the goals associated with each risk factor.   Aerobic Exercise & Resistance Training: - Gives group verbal and written discussion on the health impact of inactivity. On the components of aerobic and resistive training programs and the benefits of this training and how to safely progress through these programs.   Flexibility, Balance, General Exercise Guidelines: - Provides group verbal and written instruction on the benefits of flexibility and balance training programs. Provides general exercise guidelines with specific guidelines to those with heart or lung disease. Demonstration and skill practice provided. Flowsheet Row Cardiac Rehab from 08/17/2016 in [facility] Cardiac and Pulmonary Rehab  Date  07/20/16  Educator  [facility]  Instruction Review Code  2- meets goals/outcomes      Stress Management: - Provides group verbal and written instruction about the health risks of elevated stress, cause of high stress, and healthy ways to reduce stress. Flowsheet Row Cardiac Rehab from 08/17/2016 in [facility] Cardiac and Pulmonary Rehab  Date  07/22/16  Educator  [place]  Instruction Review Code  2- meets goals/outcomes      Depression: - Provides group verbal and written instruction on the correlation between heart/lung disease and depressed mood, treatment options, and the stigmas associated with seeking treatment.   Anatomy & Physiology of the Heart: - Group verbal and written instruction and models provide basic cardiac anatomy and physiology, with the coronary electrical and arterial systems. Review of: AMI, Angina, Valve disease, Heart Failure, Cardiac Arrhythmia, Pacemakers, and the ICD. Flowsheet Row Cardiac  Rehab from 08/17/2016 in [facility] Cardiac and Pulmonary Rehab  Date  07/27/16  Educator  SB  Instruction Review Code  2- meets goals/outcomes      Cardiac Procedures: - Group verbal and written instruction and models to describe the testing methods done to diagnose heart disease. Reviews the outcomes of the test results. Describes the treatment choices: Medical Management, Angioplasty, or Coronary Bypass Surgery. Flowsheet Row Cardiac Rehab from 08/17/2016 in [facility] Cardiac and Pulmonary Rehab  Date  08/03/16  Educator  SB  Instruction Review Code  2- meets goals/outcomes      Cardiac Medications: - Group verbal and written instruction to review commonly prescribed medications for heart disease. Reviews the medication, class of the drug, and side effects. Includes the steps to properly store meds and maintain the prescription regimen. Flowsheet Row Cardiac Rehab from 08/17/2016 in [facility] Cardiac and Pulmonary Rehab  Date  08/05/16 [cardiac meds lecture 1]  Educator  MA  Instruction Review Code  R- Review/reinforce [Second Class]      Go Sex-Intimacy & Heart Disease, Get SMART - Goal Setting: - Group verbal and written instruction through game format to discuss heart disease and the return to sexual intimacy. Provides group verbal and written material to discuss and apply goal setting through the application of the S.M.A.R.T. Method. Flowsheet Row Cardiac Rehab from 08/17/2016 in [facility] Cardiac and Pulmonary Rehab  Date  08/03/16  Educator  SB  Instruction Review Code  2- meets goals/outcomes      Other Matters of the Heart: - Provides group verbal, written materials and models to describe Heart Failure, Angina, Valve Disease, and Diabetes in the realm of heart disease. Includes description of the disease process and treatment options available to the cardiac patient. Flowsheet Row Cardiac Rehab from 08/17/2016 in [facility] Cardiac and Pulmonary Rehab  Date  07/27/16  Educator  SB  Instruction  Review Code  2- meets goals/outcomes      Exercise & Equipment Safety: - Individual verbal instruction and demonstration of equipment use and safety with use of the equipment. Flowsheet Row Cardiac Rehab from 08/17/2016 in [facility] Cardiac and Pulmonary Rehab  Date  06/10/16  Educator  C.[email or web address]  Instruction Review Code  1- partially meets, needs review/practice      Infection Prevention: - Provides verbal and written material to individual with discussion of infection control including proper hand washing and proper equipment cleaning during exercise session. Flowsheet Row Cardiac Rehab from 08/17/2016 in [facility] Cardiac and Pulmonary Rehab  Date  06/10/16  Educator  C. EnterkinRN [facility] 0.1so reinforced to use wipes to clean exercise equipmen]  Instruction Review Code  2- meets goals/outcomes      Falls Prevention: - Provides verbal and written material to individual with discussion of falls prevention and safety. Flowsheet Row Cardiac Rehab from 08/17/2016 in [facility] Cardiac and Pul[facility] Rehab  Date  06/10/16  Educator  C. EnterkinRN  Instruction Review Code  2- meets goals/outcomes      Diabetes: -  Individual verbal and written instruction to review signs/symptoms of diabetes, desired ranges of glucose level fasting, after meals and with exercise. Advice that pre and post exercise glucose checks will be done for 3 sessions at entry of program. [place] from 08/17/2016 in [facility] Cardiac and Pulmonary Rehab  Date  06/10/16  Educator  C. [place]  Instruction Review Code  1- partially meets, needs review/practice       Knowledge Questionnaire Score:     Knowledge Questionnaire Score - 08/05/16 1023      Knowledge Questionnaire Score   Pre Score 19/28   Post Score 25/28      Core Components/Risk Factors/Patient Goals at Admission:     Personal Goals and Risk Factors at Admission - 06/10/16 1305      Core Components/Risk Factors/Patient Goals on  Admission   Sedentary Yes  Brandon Wilkerson said he does walk some.    Intervention Provide advice, education, support and counseling about physical activity/exercise needs.;Develop an individualized exercise prescription for aerobic and resistive training based on initial evaluation findings, risk stratification, comorbidities and participant's personal goals.   Expected Outcomes Achievement of increased cardiorespiratory fitness and enhanced flexibility, muscular endurance and strength shown through measurements of functional capacity and personal statement of participant.   Diabetes Yes   Intervention Provide education about signs/symptoms and action to take for hypo/hyperglycemia.;Provide education about proper nutrition, including hydration, and aerobic/resistive exercise prescription along with prescribed medications to achieve blood glucose in normal ranges: Fasting glucose 65-99 mg/dL   Expected Outcomes Short Term: Participant verbalizes understanding of the signs/symptoms and immediate care of hyper/hypoglycemia, proper foot care and importance of medication, aerobic/resistive exercise and nutrition plan for blood glucose control.;Long Term: Attainment of HbA1C < 7%.   Hypertension Yes   Intervention Provide education on lifestyle modifcations including regular physical activity/exercise, weight management, moderate sodium restriction and increased consumption of fresh fruit, vegetables, and low fat dairy, alcohol moderation, and smoking cessation.;Monitor prescription use compliance.   Expected Outcomes Short Term: Continued assessment and intervention until BP is < 140/7m HG in hypertensive participants. < 130/851mHG in hypertensive participants with diabetes, heart failure or chronic kidney disease.;Long Term: Maintenance of blood pressure at goal levels.      Core Components/Risk Factors/Patient Goals Review:      Goals and Risk Factor Review    Row Name 06/17/16 0934 07/08/16 1046 07/29/16  1033         Core Components/Risk Factors/Patient Goals Review   Personal Goals Review Sedentary;Increase Strength and Stamina;Improve shortness of breath with ADL's;Hypertension;Diabetes Diabetes;Hypertension;Sedentary;Increase Strength and Stamina Diabetes;Hypertension;Increase Strength and Stamina     Review Brandon Wilkerson off to good start with program.  He is already able to tell a difference in how he is feeling.  He was able to use his push mower the other day.  His blood sugars have been stable.  His blood pressures have a stable pattern of being higher in the morning and lower by the afternoon. Brandon Wilkerson his vacation and his grandkids.  As a result, his blood sugar has been a little elevated, but he is getting back on track.  He did walk on the beach daily while away on vacation.  His blood pressure have been the 130s/70s. Brandon Wilkerson been working hard in rehab and at home.  The Trulicity seems to be improving his blood sugars.  His blood pressure have been steady.  He feels that the program has helped him feel much better overall.  Expected Outcomes We will continue to work with Brandon Wilkerson in the progream and watching his blood pressures.  He will continue to feel better and stronger. Brandon Wilkerson will get back on his heart healthy diet.  He will continue to come to exercise and education classes.  We will talk about home exercise guidelines soon. Brandon Wilkerson is nearing graduation and will continue to work hard.  We will continue to monitor his blood pressure during exercise.        Core Components/Risk Factors/Patient Goals at Discharge (Final Review):      Goals and Risk Factor Review - 07/29/16 1033      Core Components/Risk Factors/Patient Goals Review   Personal Goals Review Diabetes;Hypertension;Increase Strength and Stamina   Review Arzell has been working hard in rehab and at home.  The Trulicity seems to be improving his blood sugars.  His blood pressure have been steady.  He feels that the  program has helped him feel much better overall.   Expected Outcomes Philmore is nearing graduation and will continue to work hard.  We will continue to monitor his blood pressure during exercise.      ITP Comments:     ITP Comments    Row Name 06/10/16 1310 06/24/16 0753 07/22/16 0752       ITP Comments Alfio is going to check with the [facility] since he has a $45copay for each Cardiac REhab visit. Reinforced for Lenny to clean his exercise equipment before use since he reports his WBC has been 0.1 and he is at risk for infections. Eulas said he will talk to the Cardiac REhab registered dietician since it has been about 16 years since he met with one.  30 day review. Continue with ITP 30 day review. Continue with ITP unless changes noted by Medical Director at signature of review.        Comments: Discharge ITP

## 2016-08-17 NOTE — Progress Notes (Signed)
Discharge Summary  Patient Details  Name: Brandon Wilkerson MRN: [id number] Date of Birth: 1941/10/05 Referring Provider:   Flowsheet Row Cardiac Rehab from 06/10/2016 in [facility] Cardiac and Pulmonary Rehab  Referring Provider  Welton Flakes       Number of Visits: 48  Reason for Discharge:  Patient reached a stable level of exercise. Patient independent in their exercise.  Smoking History:  History  Smoking Status  . Former Smoker  . Types: Cigarettes  . Quit date: 11/22/1979  Smokeless Tobacco  . Never Used    Diagnosis:  Status post coronary artery stent placement  ADL UCSD:   Initial Exercise Prescription:     Initial Exercise Prescription - 06/10/16 1200      Date of Initial Exercise RX and Referring Provider   Date 06/10/16   Referring Provider Welton Flakes     Treadmill   MPH 2.5   Grade 1   Minutes 15   METs 3.26     Bike   Level 2   Watts 30   Minutes 15   METs 3     Recumbant Bike   Level 2   RPM 60   Watts 30   Minutes 15   METs 3     NuStep   Level 3   Watts 60   Minutes 15   METs 3.2     Recumbant Elliptical   Level 3   RPM 60   Watts 60   Minutes 15   METs 3.2     Elliptical   Level 1   Speed 50   Minutes 15   METs 3     REL-XR   Level 3   Watts 50   Minutes 15   METs 3     T5 Nustep   Level 3   Watts 50   Minutes 15   METs 3     Biostep-RELP   Level 3   Watts 60   Minutes 15   METs 3.2     Prescription Details   Frequency (times per week) 3   Duration Progress to 30 minutes of continuous aerobic without signs/symptoms of physical distress     Intensity   THRR 40-80% of Max Heartrate 101-132   Ratings of Perceived Exertion 11-15   Perceived Dyspnea 0-4     Progression   Progression Continue to progress workloads to maintain intensity without signs/symptoms of physical distress.     Resistance Training   Training Prescription Yes   Weight 3   Reps 10-15      Discharge Exercise Prescription (Final Exercise  Prescription Changes):     Exercise Prescription Changes - 08/12/16 1600      Exercise Review   Progression Yes     Response to Exercise   Blood Pressure (Admit) 106/56   Blood Pressure (Exercise) 148/62   Blood Pressure (Exit) 126/60   Heart Rate (Admit) 77 bpm   Heart Rate (Exercise) 114 bpm   Heart Rate (Exit) 72 bpm   Rating of Perceived Exertion (Exercise) 11   Symptoms none   Comments Home Exercise Guidelines given 07/10/16   Duration Progress to 45 minutes of aerobic exercise without signs/symptoms of physical distress   Intensity THRR unchanged     Progression   Progression Continue to progress workloads to maintain intensity without signs/symptoms of physical distress.   Average METs 3.78     Resistance Training   Training Prescription Yes   Weight 5 lbs   Reps 10-15  Interval Training   Interval Training No     Treadmill   MPH 3.2   Grade 2   Minutes 15   METs 4.33     Recumbant Elliptical   Level 4.2   RPM 60   Minutes 15   METs 4.3     T5 Nustep   Level 5   Minutes 15   METs 2.7     Home Exercise Plan   Plans to continue exercise at Home  walking   Frequency Add 4 additional days to program exercise sessions.      Functional Capacity:     6 Minute Walk    Row Name 06/10/16 1230 08/12/16 0917       6 Minute Walk   Phase Initial Discharge    Distance 1545 feet 1786 feet    Distance % Change  - 15.6 %    Walk Time 6 minutes 6 minutes    # of Rest Breaks 0 0    MPH 2.9 3.38    METS 3.5 4.03    RPE 13 11    VO2 Peak 12.3 14.1    Symptoms No No    Resting HR 70 bpm 66 bpm    Resting BP 132/80 106/56    Max Ex. HR 95 bpm 114 bpm    Max Ex. BP 164/60 148/62       Psychological, QOL, Others - Outcomes: PHQ 2/9: Depression screen [facility] 2/9 08/05/2016 06/10/2016  Decreased Interest 0 1  Down, Depressed, Hopeless 0 0  PHQ - 2 Score 0 1  Altered sleeping 0 0  Tired, decreased energy 0 1  Change in appetite 0 0  Feeling bad or  failure about yourself  0 0  Trouble concentrating 0 0  Moving slowly or fidgety/restless 0 0  Suicidal thoughts 0 0  PHQ-9 Score 0 2  Difficult doing work/chores Not difficult at all Somewhat difficult    Quality of Life:     Quality of Life - 08/05/16 0806      Quality of Life Scores   Health/Function Pre 25.71 %   Health/Function Post 25.4 %   Health/Function % Change -1.21 %   Socioeconomic Pre 30 %   Socioeconomic Post 27.75 %   Socioeconomic % Change  -7.5 %   Psych/Spiritual Pre 30 %   Psych/Spiritual Post 30 %   Psych/Spiritual % Change 0 %   Family Pre 30 %   Family Post 27.6 %   Family % Change -8 %   GLOBAL Pre 28.13 %   GLOBAL Post 27.14 %   GLOBAL % Change -3.52 %      Personal Goals: Goals established at orientation with interventions provided to work toward goal.     Personal Goals and Risk Factors at Admission - 06/10/16 1305      Core Components/Risk Factors/Patient Goals on Admission   Sedentary Yes  Cordaryl said he does walk some.    Intervention Provide advice, education, support and counseling about physical activity/exercise needs.;Develop an individualized exercise prescription for aerobic and resistive training based on initial evaluation findings, risk stratification, comorbidities and participant's personal goals.   Expected Outcomes Achievement of increased cardiorespiratory fitness and enhanced flexibility, muscular endurance and strength shown through measurements of functional capacity and personal statement of participant.   Diabetes Yes   Intervention Provide education about signs/symptoms and action to take for hypo/hyperglycemia.;Provide education about proper nutrition, including hydration, and aerobic/resistive exercise prescription along with prescribed medications to  achieve blood glucose in normal ranges: Fasting glucose 65-99 mg/dL   Expected Outcomes Short Term: Participant verbalizes understanding of the signs/symptoms and  immediate care of hyper/hypoglycemia, proper foot care and importance of medication, aerobic/resistive exercise and nutrition plan for blood glucose control.;Long Term: Attainment of HbA1C < 7%.   Hypertension Yes   Intervention Provide education on lifestyle modifcations including regular physical activity/exercise, weight management, moderate sodium restriction and increased consumption of fresh fruit, vegetables, and low fat dairy, alcohol moderation, and smoking cessation.;Monitor prescription use compliance.   Expected Outcomes Short Term: Continued assessment and intervention until BP is < 140/4590mm HG in hypertensive participants. < 130/7780mm HG in hypertensive participants with diabetes, heart failure or chronic kidney disease.;Long Term: Maintenance of blood pressure at goal levels.       Personal Goals Discharge:     Goals and Risk Factor Review    Row Name 06/17/16 0934 07/08/16 1046 07/29/16 1033         Core Components/Risk Factors/Patient Goals Review   Personal Goals Review Sedentary;Increase Strength and Stamina;Improve shortness of breath with ADL's;Hypertension;Diabetes Diabetes;Hypertension;Sedentary;Increase Strength and Stamina Diabetes;Hypertension;Increase Strength and Stamina     Review Brandon Wilkerson is off to good start with program.  He is already able to tell a difference in how he is feeling.  He was able to use his push mower the other day.  His blood sugars have been stable.  His blood pressures have a stable pattern of being higher in the morning and lower by the afternoon. Brandon Wilkerson enjoyed his vacation and his grandkids.  As a result, his blood sugar has been a little elevated, but he is getting back on track.  He did walk on the beach daily while away on vacation.  His blood pressure have been the 130s/70s. Brandon Wilkerson has been working hard in rehab and at home.  The Trulicity seems to be improving his blood sugars.  His blood pressure have been steady.  He feels that the program  has helped him feel much better overall.     Expected Outcomes We will continue to work with Brandon Wilkerson in the progream and watching his blood pressures.  He will continue to feel better and stronger. Brandon Wilkerson will get back on his heart healthy diet.  He will continue to come to exercise and education classes.  We will talk about home exercise guidelines soon. Brandon Wilkerson is nearing graduation and will continue to work hard.  We will continue to monitor his blood pressure during exercise.        Nutrition & Weight - Outcomes:     Pre Biometrics - 06/10/16 1228      Pre Biometrics   Height 6' 0.4" (1.839 m)   Weight 201 lb 12.8 oz (91.5 kg)   Waist Circumference 40.5 inches   Hip Circumference 42 inches   Waist to Hip Ratio 0.96 %   BMI (Calculated) 27.1   Single Leg Stand 30 seconds         Post Biometrics - 08/12/16 0922       Post  Biometrics   Height 6' 0.4" (1.839 m)   Weight 202 lb (91.6 kg)   Waist Circumference 40.5 inches   Hip Circumference 42 inches   Waist to Hip Ratio 0.96 %   BMI (Calculated) 27.2   Single Leg Stand 30 seconds      Nutrition:     Nutrition Therapy & Goals - 07/10/16 1045      Nutrition Therapy   Diet Instructed patient  on a heart healthy meal plan including dietary guidelines for diabetes.  Meal plan is based on 2000 calories as patient wants to lose a few pounds.   Drug/Food Interactions Statins/Certain Fruits   Protein (specify units) 8   Fiber 30 grams   Whole Grain Foods 3 servings   Saturated Fats 13 max. grams   Fruits and Vegetables 5 servings/day  Patient loves fruit and reports he eats larger portions than recommended on diabetes meal plan.   Sodium 2000 grams  1500mg  ideal     Personal Nutrition Goals   Personal Goal #1 Read labels for saturated fat, trans fat and sodium   Personal Goal #2 Be more aware of portion control especially of starches and fruits.   Personal Goal #3 Include at least 8 oz of protein daily.   Comments  Overall, patient makes healthy food choices. He states he just needs to "get back on track" with portion control.     Intervention Plan   Intervention Prescribe, educate and counsel regarding individualized specific dietary modifications aiming towards targeted core components such as weight, hypertension, lipid management, diabetes, heart failure and other comorbidities.;Nutrition handout(s) given to patient.   Expected Outcomes Short Term Goal: Understand basic principles of dietary content, such as calories, fat, sodium, cholesterol and nutrients.;Short Term Goal: A plan has been developed with personal nutrition goals set during dietitian appointment.;Long Term Goal: Adherence to prescribed nutrition plan.      Nutrition Discharge:     Nutrition Assessments - 08/05/16 0806      Rate Your Plate Scores   Post Score 65   Post Score % 72 %      Education Questionnaire Score:     Knowledge Questionnaire Score - 08/05/16 1023      Knowledge Questionnaire Score   Pre Score 19/28   Post Score 25/28      Goals reviewed with patient; copy given to patient.

## 2016-08-20 ENCOUNTER — Inpatient Hospital Stay: Payer: Medicare HMO

## 2016-08-20 ENCOUNTER — Other Ambulatory Visit: Payer: Medicare HMO

## 2016-08-20 ENCOUNTER — Ambulatory Visit: Payer: Medicare HMO

## 2016-08-20 ENCOUNTER — Other Ambulatory Visit: Payer: Self-pay

## 2016-08-20 DIAGNOSIS — D709 Neutropenia, unspecified: Secondary | ICD-10-CM | POA: Diagnosis not present

## 2016-08-20 DIAGNOSIS — D708 Other neutropenia: Secondary | ICD-10-CM

## 2016-08-20 LAB — CBC WITH DIFFERENTIAL/PLATELET
BASOS ABS: 0 10*3/uL (ref 0–0.1)
BASOS PCT: 3 %
EOS ABS: 0.1 10*3/uL (ref 0–0.7)
Eosinophils Relative: 7 %
HCT: 29.7 % — ABNORMAL LOW (ref 40.0–52.0)
HEMOGLOBIN: 10.5 g/dL — AB (ref 13.0–18.0)
Lymphocytes Relative: 38 %
Lymphs Abs: 0.8 10*3/uL — ABNORMAL LOW (ref 1.0–3.6)
MCH: 30.2 pg (ref 26.0–34.0)
MCHC: 35.4 g/dL (ref 32.0–36.0)
MCV: 85.2 fL (ref 80.0–100.0)
Monocytes Absolute: 0.3 10*3/uL (ref 0.2–1.0)
Monocytes Relative: 16 %
NEUTROS PCT: 36 %
Neutro Abs: 0.7 10*3/uL — ABNORMAL LOW (ref 1.4–6.5)
Platelets: 141 10*3/uL — ABNORMAL LOW (ref 150–440)
RBC: 3.48 MIL/uL — AB (ref 4.40–5.90)
RDW: 15.1 % — ABNORMAL HIGH (ref 11.5–14.5)
WBC: 2 10*3/uL — AB (ref 3.8–10.6)

## 2016-08-20 MED ORDER — TBO-FILGRASTIM 480 MCG/0.8ML ~~LOC~~ SOSY
480.0000 ug | PREFILLED_SYRINGE | Freq: Once | SUBCUTANEOUS | Status: AC
  Administered 2016-08-20: 480 ug via SUBCUTANEOUS
  Filled 2016-08-20: qty 0.8

## 2016-08-24 ENCOUNTER — Telehealth: Payer: Self-pay | Admitting: *Deleted

## 2016-08-24 NOTE — Telephone Encounter (Signed)
spoke with Elizabeth-pt's wife.  WBC count ANC 700. Continue to monitor at this time. No new tx recommendations.  Teach back process performed with patient's wife.

## 2016-08-24 NOTE — Telephone Encounter (Signed)
-----   Message from Earna CoderGovinda R Brahmanday, MD sent at 08/21/2016  4:30 PM EDT ----- Please inform patient that his white count is slightly better ANC 700; continue the current plan at this time. No new recommendations.

## 2016-08-27 ENCOUNTER — Other Ambulatory Visit: Payer: Self-pay

## 2016-08-27 ENCOUNTER — Ambulatory Visit: Payer: Medicare HMO

## 2016-08-27 ENCOUNTER — Other Ambulatory Visit: Payer: Medicare HMO

## 2016-08-27 ENCOUNTER — Inpatient Hospital Stay: Payer: Medicare HMO

## 2016-08-27 DIAGNOSIS — D709 Neutropenia, unspecified: Secondary | ICD-10-CM

## 2016-08-27 LAB — CBC WITH DIFFERENTIAL/PLATELET
BASOS ABS: 0.1 10*3/uL (ref 0–0.1)
BASOS PCT: 3 %
EOS ABS: 0.1 10*3/uL (ref 0–0.7)
EOS PCT: 4 %
HCT: 29.6 % — ABNORMAL LOW (ref 40.0–52.0)
Hemoglobin: 10.5 g/dL — ABNORMAL LOW (ref 13.0–18.0)
Lymphocytes Relative: 22 %
Lymphs Abs: 0.7 10*3/uL — ABNORMAL LOW (ref 1.0–3.6)
MCH: 29.8 pg (ref 26.0–34.0)
MCHC: 35.4 g/dL (ref 32.0–36.0)
MCV: 84.3 fL (ref 80.0–100.0)
MONO ABS: 0.5 10*3/uL (ref 0.2–1.0)
Monocytes Relative: 14 %
Neutro Abs: 1.9 10*3/uL (ref 1.4–6.5)
Neutrophils Relative %: 57 %
PLATELETS: 164 10*3/uL (ref 150–440)
RBC: 3.51 MIL/uL — ABNORMAL LOW (ref 4.40–5.90)
RDW: 14.8 % — AB (ref 11.5–14.5)
WBC: 3.3 10*3/uL — AB (ref 3.8–10.6)

## 2016-08-28 ENCOUNTER — Ambulatory Visit: Payer: Medicare HMO

## 2016-08-31 ENCOUNTER — Telehealth: Payer: Self-pay | Admitting: *Deleted

## 2016-08-31 NOTE — Telephone Encounter (Signed)
-----   Message from Earna CoderGovinda R Brahmanday, MD sent at 08/28/2016  6:37 PM EDT ----- Please inform patient white counts improve; continue current plan of care.

## 2016-08-31 NOTE — Telephone Encounter (Signed)
Contacted pt. - left vm on home phone. white counts improved; continue current plan of care and keep all apts the same as scheduled.

## 2016-09-03 ENCOUNTER — Other Ambulatory Visit: Payer: Medicare HMO

## 2016-09-03 ENCOUNTER — Ambulatory Visit: Payer: Medicare HMO

## 2016-09-03 ENCOUNTER — Inpatient Hospital Stay: Payer: Medicare HMO

## 2016-09-03 DIAGNOSIS — D709 Neutropenia, unspecified: Secondary | ICD-10-CM | POA: Diagnosis not present

## 2016-09-03 LAB — CBC WITH DIFFERENTIAL/PLATELET
BASOS ABS: 0 10*3/uL (ref 0–0.1)
Basophils Relative: 0 %
Eosinophils Absolute: 0.2 10*3/uL (ref 0–0.7)
Eosinophils Relative: 3 %
HEMATOCRIT: 28.6 % — AB (ref 40.0–52.0)
Hemoglobin: 10.1 g/dL — ABNORMAL LOW (ref 13.0–18.0)
LYMPHS ABS: 0.8 10*3/uL — AB (ref 1.0–3.6)
LYMPHS PCT: 17 %
MCH: 29.7 pg (ref 26.0–34.0)
MCHC: 35.1 g/dL (ref 32.0–36.0)
MCV: 84.6 fL (ref 80.0–100.0)
MONO ABS: 0.4 10*3/uL (ref 0.2–1.0)
MONOS PCT: 8 %
NEUTROS ABS: 3.5 10*3/uL (ref 1.4–6.5)
Neutrophils Relative %: 72 %
Platelets: 172 10*3/uL (ref 150–440)
RBC: 3.38 MIL/uL — ABNORMAL LOW (ref 4.40–5.90)
RDW: 15 % — AB (ref 11.5–14.5)
WBC: 4.9 10*3/uL (ref 3.8–10.6)

## 2016-09-10 ENCOUNTER — Other Ambulatory Visit: Payer: Medicare HMO

## 2016-09-10 ENCOUNTER — Inpatient Hospital Stay: Payer: Medicare HMO | Attending: Internal Medicine

## 2016-09-10 ENCOUNTER — Ambulatory Visit: Payer: Medicare HMO

## 2016-09-10 ENCOUNTER — Other Ambulatory Visit: Payer: Self-pay

## 2016-09-10 ENCOUNTER — Inpatient Hospital Stay: Payer: Medicare HMO

## 2016-09-10 DIAGNOSIS — Z79899 Other long term (current) drug therapy: Secondary | ICD-10-CM | POA: Insufficient documentation

## 2016-09-10 DIAGNOSIS — D709 Neutropenia, unspecified: Secondary | ICD-10-CM

## 2016-09-10 DIAGNOSIS — D708 Other neutropenia: Secondary | ICD-10-CM

## 2016-09-10 LAB — CBC WITH DIFFERENTIAL/PLATELET
BASOS PCT: 3 %
Basophils Absolute: 0 10*3/uL (ref 0–0.1)
EOS ABS: 0.2 10*3/uL (ref 0–0.7)
Eosinophils Relative: 12 %
HCT: 29.5 % — ABNORMAL LOW (ref 40.0–52.0)
HEMOGLOBIN: 10.3 g/dL — AB (ref 13.0–18.0)
Lymphocytes Relative: 41 %
Lymphs Abs: 0.6 10*3/uL — ABNORMAL LOW (ref 1.0–3.6)
MCH: 29.3 pg (ref 26.0–34.0)
MCHC: 34.7 g/dL (ref 32.0–36.0)
MCV: 84.5 fL (ref 80.0–100.0)
MONO ABS: 0.4 10*3/uL (ref 0.2–1.0)
MONOS PCT: 23 %
NEUTROS PCT: 21 %
Neutro Abs: 0.3 10*3/uL — ABNORMAL LOW (ref 1.4–6.5)
Platelets: 182 10*3/uL (ref 150–440)
RBC: 3.5 MIL/uL — ABNORMAL LOW (ref 4.40–5.90)
RDW: 14.7 % — AB (ref 11.5–14.5)
WBC: 1.6 10*3/uL — ABNORMAL LOW (ref 3.8–10.6)

## 2016-09-10 MED ORDER — TBO-FILGRASTIM 480 MCG/0.8ML ~~LOC~~ SOSY
480.0000 ug | PREFILLED_SYRINGE | Freq: Once | SUBCUTANEOUS | Status: AC
  Administered 2016-09-10: 480 ug via SUBCUTANEOUS
  Filled 2016-09-10: qty 0.8

## 2016-09-17 ENCOUNTER — Other Ambulatory Visit: Payer: Medicare HMO

## 2016-09-17 ENCOUNTER — Ambulatory Visit: Payer: Medicare HMO | Admitting: Internal Medicine

## 2016-09-17 ENCOUNTER — Ambulatory Visit: Payer: Medicare HMO

## 2016-09-17 ENCOUNTER — Inpatient Hospital Stay: Payer: Medicare HMO

## 2016-09-17 DIAGNOSIS — D708 Other neutropenia: Secondary | ICD-10-CM

## 2016-09-17 DIAGNOSIS — D709 Neutropenia, unspecified: Secondary | ICD-10-CM

## 2016-09-17 LAB — CBC WITH DIFFERENTIAL/PLATELET
Basophils Absolute: 0.1 10*3/uL (ref 0–0.1)
Basophils Relative: 2 %
EOS PCT: 6 %
Eosinophils Absolute: 0.2 10*3/uL (ref 0–0.7)
HCT: 31.1 % — ABNORMAL LOW (ref 40.0–52.0)
HEMOGLOBIN: 10.8 g/dL — AB (ref 13.0–18.0)
LYMPHS ABS: 1 10*3/uL (ref 1.0–3.6)
LYMPHS PCT: 25 %
MCH: 29.1 pg (ref 26.0–34.0)
MCHC: 34.8 g/dL (ref 32.0–36.0)
MCV: 83.4 fL (ref 80.0–100.0)
MONOS PCT: 8 %
Monocytes Absolute: 0.3 10*3/uL (ref 0.2–1.0)
Neutro Abs: 2.5 10*3/uL (ref 1.4–6.5)
Neutrophils Relative %: 59 %
Platelets: 170 10*3/uL (ref 150–440)
RBC: 3.73 MIL/uL — AB (ref 4.40–5.90)
RDW: 14.5 % (ref 11.5–14.5)
WBC: 4.1 10*3/uL (ref 3.8–10.6)

## 2016-09-17 LAB — IRON AND TIBC
IRON: 36 ug/dL — AB (ref 45–182)
Saturation Ratios: 12 % — ABNORMAL LOW (ref 17.9–39.5)
TIBC: 314 ug/dL (ref 250–450)
UIBC: 278 ug/dL

## 2016-09-17 LAB — FERRITIN: Ferritin: 100 ng/mL (ref 24–336)

## 2016-09-18 ENCOUNTER — Ambulatory Visit: Payer: Medicare HMO

## 2016-09-24 ENCOUNTER — Inpatient Hospital Stay: Payer: Medicare HMO

## 2016-09-24 ENCOUNTER — Ambulatory Visit: Payer: Medicare HMO

## 2016-09-24 ENCOUNTER — Other Ambulatory Visit: Payer: Medicare HMO

## 2016-09-24 ENCOUNTER — Other Ambulatory Visit: Payer: Self-pay

## 2016-09-24 DIAGNOSIS — D709 Neutropenia, unspecified: Secondary | ICD-10-CM | POA: Diagnosis not present

## 2016-09-24 LAB — CBC WITH DIFFERENTIAL/PLATELET
BASOS PCT: 3 %
Basophils Absolute: 0.1 10*3/uL (ref 0–0.1)
Eosinophils Absolute: 0.3 10*3/uL (ref 0–0.7)
Eosinophils Relative: 8 %
HEMATOCRIT: 30.9 % — AB (ref 40.0–52.0)
HEMOGLOBIN: 10.9 g/dL — AB (ref 13.0–18.0)
LYMPHS PCT: 28 %
Lymphs Abs: 1.1 10*3/uL (ref 1.0–3.6)
MCH: 29.6 pg (ref 26.0–34.0)
MCHC: 35.1 g/dL (ref 32.0–36.0)
MCV: 84.2 fL (ref 80.0–100.0)
MONOS PCT: 9 %
Monocytes Absolute: 0.4 10*3/uL (ref 0.2–1.0)
NEUTROS ABS: 2 10*3/uL (ref 1.4–6.5)
Neutrophils Relative %: 52 %
Platelets: 178 10*3/uL (ref 150–440)
RBC: 3.67 MIL/uL — ABNORMAL LOW (ref 4.40–5.90)
RDW: 14.6 % — ABNORMAL HIGH (ref 11.5–14.5)
WBC: 3.8 10*3/uL (ref 3.8–10.6)

## 2016-10-01 ENCOUNTER — Other Ambulatory Visit: Payer: Medicare HMO

## 2016-10-01 ENCOUNTER — Ambulatory Visit: Payer: Medicare HMO

## 2016-10-01 ENCOUNTER — Telehealth: Payer: Self-pay

## 2016-10-01 ENCOUNTER — Inpatient Hospital Stay: Payer: Medicare HMO

## 2016-10-01 DIAGNOSIS — D708 Other neutropenia: Secondary | ICD-10-CM

## 2016-10-01 DIAGNOSIS — D709 Neutropenia, unspecified: Secondary | ICD-10-CM

## 2016-10-01 LAB — CBC WITH DIFFERENTIAL/PLATELET
BASOS ABS: 0.1 10*3/uL (ref 0–0.1)
BASOS PCT: 4 %
EOS PCT: 9 %
Eosinophils Absolute: 0.1 10*3/uL (ref 0–0.7)
HEMATOCRIT: 31.4 % — AB (ref 40.0–52.0)
Hemoglobin: 11.1 g/dL — ABNORMAL LOW (ref 13.0–18.0)
Lymphocytes Relative: 57 %
Lymphs Abs: 1 10*3/uL (ref 1.0–3.6)
MCH: 29.6 pg (ref 26.0–34.0)
MCHC: 35.2 g/dL (ref 32.0–36.0)
MCV: 84.2 fL (ref 80.0–100.0)
MONO ABS: 0.4 10*3/uL (ref 0.2–1.0)
MONOS PCT: 24 %
NEUTROS ABS: 0.1 10*3/uL — AB (ref 1.4–6.5)
Neutrophils Relative %: 6 %
PLATELETS: 183 10*3/uL (ref 150–440)
RBC: 3.73 MIL/uL — ABNORMAL LOW (ref 4.40–5.90)
RDW: 15.1 % — AB (ref 11.5–14.5)
WBC: 1.7 10*3/uL — ABNORMAL LOW (ref 3.8–10.6)

## 2016-10-01 MED ORDER — TBO-FILGRASTIM 480 MCG/0.8ML ~~LOC~~ SOSY
480.0000 ug | PREFILLED_SYRINGE | Freq: Once | SUBCUTANEOUS | Status: AC
  Administered 2016-10-01: 480 ug via SUBCUTANEOUS
  Filled 2016-10-01: qty 0.8

## 2016-10-01 NOTE — Telephone Encounter (Signed)
Critical ANC called at 0837 am MD made aware immediately.  Pt receiving neupogen today.

## 2016-10-07 ENCOUNTER — Ambulatory Visit: Payer: Medicare HMO | Admitting: Internal Medicine

## 2016-10-07 ENCOUNTER — Ambulatory Visit: Payer: Medicare HMO

## 2016-10-07 ENCOUNTER — Other Ambulatory Visit: Payer: Medicare HMO

## 2016-10-08 ENCOUNTER — Inpatient Hospital Stay: Payer: Medicare HMO | Attending: Internal Medicine

## 2016-10-08 ENCOUNTER — Inpatient Hospital Stay (HOSPITAL_BASED_OUTPATIENT_CLINIC_OR_DEPARTMENT_OTHER): Payer: Medicare HMO | Admitting: Internal Medicine

## 2016-10-08 ENCOUNTER — Inpatient Hospital Stay: Payer: Medicare HMO

## 2016-10-08 VITALS — BP 143/73 | HR 63 | Temp 98.1°F | Resp 18 | Wt 207.0 lb

## 2016-10-08 DIAGNOSIS — M1612 Unilateral primary osteoarthritis, left hip: Secondary | ICD-10-CM

## 2016-10-08 DIAGNOSIS — Z79899 Other long term (current) drug therapy: Secondary | ICD-10-CM | POA: Diagnosis not present

## 2016-10-08 DIAGNOSIS — D509 Iron deficiency anemia, unspecified: Secondary | ICD-10-CM | POA: Diagnosis present

## 2016-10-08 DIAGNOSIS — I739 Peripheral vascular disease, unspecified: Secondary | ICD-10-CM | POA: Diagnosis not present

## 2016-10-08 DIAGNOSIS — I129 Hypertensive chronic kidney disease with stage 1 through stage 4 chronic kidney disease, or unspecified chronic kidney disease: Secondary | ICD-10-CM

## 2016-10-08 DIAGNOSIS — N189 Chronic kidney disease, unspecified: Secondary | ICD-10-CM | POA: Insufficient documentation

## 2016-10-08 DIAGNOSIS — Z7982 Long term (current) use of aspirin: Secondary | ICD-10-CM | POA: Diagnosis not present

## 2016-10-08 DIAGNOSIS — E1122 Type 2 diabetes mellitus with diabetic chronic kidney disease: Secondary | ICD-10-CM

## 2016-10-08 DIAGNOSIS — D708 Other neutropenia: Secondary | ICD-10-CM

## 2016-10-08 DIAGNOSIS — I2510 Atherosclerotic heart disease of native coronary artery without angina pectoris: Secondary | ICD-10-CM | POA: Insufficient documentation

## 2016-10-08 DIAGNOSIS — G4730 Sleep apnea, unspecified: Secondary | ICD-10-CM

## 2016-10-08 DIAGNOSIS — D709 Neutropenia, unspecified: Secondary | ICD-10-CM

## 2016-10-08 LAB — CBC WITH DIFFERENTIAL/PLATELET
Basophils Absolute: 0 10*3/uL (ref 0–0.1)
Basophils Relative: 2 %
EOS ABS: 0.2 10*3/uL (ref 0–0.7)
Eosinophils Relative: 8 %
HCT: 32 % — ABNORMAL LOW (ref 40.0–52.0)
HEMOGLOBIN: 11.3 g/dL — AB (ref 13.0–18.0)
LYMPHS ABS: 0.8 10*3/uL — AB (ref 1.0–3.6)
LYMPHS PCT: 37 %
MCH: 29.5 pg (ref 26.0–34.0)
MCHC: 35.4 g/dL (ref 32.0–36.0)
MCV: 83.5 fL (ref 80.0–100.0)
Monocytes Absolute: 0.2 10*3/uL (ref 0.2–1.0)
Monocytes Relative: 12 %
NEUTROS ABS: 0.9 10*3/uL — AB (ref 1.4–6.5)
NEUTROS PCT: 41 %
Platelets: 148 10*3/uL — ABNORMAL LOW (ref 150–440)
RBC: 3.83 MIL/uL — AB (ref 4.40–5.90)
RDW: 14.9 % — ABNORMAL HIGH (ref 11.5–14.5)
WBC: 2.1 10*3/uL — AB (ref 3.8–10.6)

## 2016-10-08 MED ORDER — TBO-FILGRASTIM 480 MCG/0.8ML ~~LOC~~ SOSY
480.0000 ug | PREFILLED_SYRINGE | Freq: Once | SUBCUTANEOUS | Status: AC
  Administered 2016-10-08: 480 ug via SUBCUTANEOUS
  Filled 2016-10-08: qty 0.8

## 2016-10-08 NOTE — Progress Notes (Signed)
[place]  [phone] [phone]     ID: [name] OB: [birth year]  MR#: [id number]  CSN#:[id number]  Patient Care Team: Jodi Marble, MD as PCP - General (Internal Medicine)  CHIEF COMPLAINT/DIAGNOSIS:  Leukopenia/Neutropenia, Mild Anemia - Mild, asymptomatic, of unclear etiology. Workup so far unremarkable as detailed below.  Bone marrow biopsy 05/10/13 - no diagnostic morphologic evidence of B-cell lymphoproliferative disorder or other hematopoietic neoplasia identified.  Normocellular to slightly hypercellular marrow for age 28-50% with maturing trilineage hematopoiesis, storage iron present, slight patchy increase in reticulin  Flow study negative for any B-cell monoclonality, nonspecific myeloid and normocytic findings with no increase in blasts,relatively increased eosinophils 7% is nonspecific,relatively increased normal B-cell precursors (hematogones)and.  Normal male karyotype, 46XY.  Labs done on 11/02/12 - Hb 13.3, platelets 172K, retic 0.036, WBC 3400 with 38% neutrophils, 35% lymphocytes, 10% variant lymphocytes, 12% monocytes. Iron study, B12, folate, ANA, serum immunoelectrophoresis (SIEP), PT, PTT, LDH, haptoglobin, Coombs test, HBsAg, HCV antibody, and HIV antibody all unremarkable.   05/03/13 - Peripheral blood Flow Cytometry. A very small (0.3% of leukocytes) B cell clone is detected. Previous phenotyping results showed similar findings. The clone is very small and is not diagnostic of any specific type of lymphoma. If the patient has a previous history of lymphoma, the finding is consistent with persistent involvement by lymphoma. 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  07/23/15 -  Bone Marrow Biopsy Report  -  nonspecific marrow findings with no diagnostic morphologic or immunophenotypic evidence of hematopoietic neoplasia. Mildly hypercellular marrow for age (40-50%) with myeloid hypoplasia,  increased eosinophils, erythroid hyperplasia with mild nonspecific dyserythropoiesis and mild megakaryocytic atypia. Diffuse mild to focally moderate increase in reticulin. Storage iron present. Flow cytometry reports relatively decreased neutrophilic cells with no immunophenotypic abnormalities or increase in blasts, relatively increased eosinophils (17%) nonspecific. Cytogenetics normal 46XY. SNP microarray result is Normal Male. The treatment with short course of steroids was attempted in November 2016, however there was no response. Treatment with G-CSF was initiated in early December 2016  HISTORY OF PRESENT ILLNESS:   75 year old male patient with a history of neutropenia of unclear etiology/ currently on Neupogen weekly is here for follow-up.  Patient noted to have a sinus congestion/upper respiratory tract "infection" that lasted for about 2 weeks. Patient did not have her take antibiotics. Symptoms resolved. No recent hospitalizations.  No weight loss. No night sweats.Denies any significant fatigue.  REVIEW  OF SYSTEMS:   ROS: As in HPI above. In addition, no new headaches or focal weakness.  No sore throat, cough, shortness of breath, sputum, hemoptysis or chest pain. No abdominal pain, constipation, diarrhea, dysuria or hematuria.   PAST MEDICAL HISTORY: Reviewed.         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 As above.  FAMILY HISTORY: Reviewed. Remarkable for diabetes, heart disease, hypertension, breast cancer.   SOCIAL HISTORY: Reviewed. Nonsmoker.  Denies alcohol or recreational drug usage.    Allergies  Allergen Reactions  . Pollen Extract Other (See Comments)    Runny nose    Current Outpatient Prescriptions  Medication Sig Dispense Refill  . aspirin 81 MG  tablet Take 81 mg by mouth daily.    Marland Kitchen atorvastatin (LIPITOR) 80 MG tablet Take 80 mg by mouth daily.    Marland Kitchen  carvedilol (COREG) 6.25 MG tablet Take 6.25 mg by mouth 2 (two) times daily with a meal.    . cloNIDine (CATAPRES) 0.3 MG tablet Take 0.3 mg by mouth 2 (two) times daily.    . clopidogrel (PLAVIX) 75 MG tablet Take 75 mg by mouth daily.    . Dulaglutide (TRULICITY [place]) Inject into the skin.    . fluticasone (FLONASE) 50 MCG/ACT nasal spray Place 1 spray into both nostrils daily.    Marland Kitchen gabapentin (NEURONTIN) 300 MG capsule Take 1 capsule (300 mg total) by mouth 3 (three) times daily. 90 capsule 0  . glipiZIDE (GLUCOTROL) 5 MG tablet Take 2.5 mg by mouth 2 (two) times daily.    . isosorbide mononitrate (IMDUR) 60 MG 24 hr tablet Take 60 mg by mouth daily.    . nitroGLYCERIN (NITROSTAT) 0.4 MG SL tablet Place 0.4 mg under the tongue every 5 (five) minutes as needed for chest pain.    . ONE TOUCH ULTRA TEST test strip use as directed;  twice a day OR MORE if needed  1   No current facility-administered medications for this visit.     PHYSICAL EXAM: Vitals:   10/08/16 0953  BP: (!) 143/73  Pulse: 63  Resp: 18  Temp: 98.1 F (36.7 C)     Body mass index is 27.76 kg/m.     BP (!) 143/73 (BP Location: Left Arm, Patient Position: Sitting)   Pulse 63   Temp 98.1 F (36.7 C) (Oral)   Resp 18   Wt 207 lb (93.9 kg)   BMI 27.76 kg/m   General Appearance:    Alert, cooperative, no distress, appears stated age;  accompanied by his wife   Head:    Normocephalic, without obvious abnormality, atraumatic  Eyes:    PERRL, conjunctiva/corneas clear, EOM's intact, fundi    benign, both eyes       Ears:    Normal TM's and external ear canals, both ears  Nose:   Nares normal, septum midline, mucosa normal, no drainage   or sinus tenderness  Throat:   Lips, mucosa, and tongue normal; teeth and gums normal  Neck:   Supple, symmetrical, trachea midline, no adenopathy;       thyroid:  No  enlargement/tenderness/nodules; carotid   bruit on L  Back:     Symmetric, no curvature, ROM normal, no CVA tenderness  Lungs:     Clear to auscultation bilaterally, respirations unlabored  Chest wall:    No tenderness or deformity  Heart:    Regular rate and rhythm, S1 and S2 normal, systolic ejection murmur 3 out of 6 on aorta   Abdomen:     Soft, non-tender, bowel sounds active all four quadrants,    no masses, no organomegaly  Extremities:   Extremities normal, atraumatic, no cyanosis or edema  Pulses:   2+ and symmetric all extremities  Skin:   Skin color, texture, turgor normal, no rashes or lesions  Lymph nodes:   Cervical, supraclavicular, and axillary nodes normal  Neurologic:   CNII-XII intact. Normal strength, sensation and reflexes      throughout     LAB RESULTS: Recent Results (from the past 2160 hour(s))  CBC with Differential     Status: Abnormal   Collection Time: 07/16/16 10:06 AM  Result Value Ref Range   WBC 2.0 (L) 3.8 - 10.6 K/uL   RBC 3.46 (L) 4.40 - 5.90 MIL/uL   Hemoglobin 10.3 (L) 13.0 - 18.0 g/dL   HCT  30.1 (L) 40.0 - 52.0 %   MCV 87.0 80.0 - 100.0 fL   MCH 29.8 26.0 - 34.0 pg   MCHC 34.2 32.0 - 36.0 g/dL   RDW 14.8 (H) 11.5 - 14.5 %   Platelets 188 150 - 440 K/uL   Neutrophils Relative % 6% %   Neutro Abs 0.1 (L) 1.4 - 6.5 K/uL    Comment: RESULT REPEATED AND VERIFIED CRITICAL RESULT CALLED TO, READ BACK BY AND VERIFIED WITH: ANITA BLACK AT 1017 07/16/2016 KMR    Lymphocytes Relative 39% %   Lymphs Abs 0.8 (L) 1.0 - 3.6 K/uL   Monocytes Relative 49% %   Monocytes Absolute 1.0 0.2 - 1.0 K/uL   Eosinophils Relative 3% %   Eosinophils Absolute 0.1 0 - 0.7 K/uL   Basophils Relative 3% %   Basophils Absolute 0.0 0 - 0.1 K/uL  CBC with Differential     Status: Abnormal   Collection Time: 08/06/16 10:15 AM  Result Value Ref Range   WBC 1.6 (L) 3.8 - 10.6 K/uL   RBC 3.78 (L) 4.40 - 5.90 MIL/uL   Hemoglobin 11.4 (L) 13.0 - 18.0 g/dL   HCT 32.6 (L)  40.0 - 52.0 %   MCV 86.4 80.0 - 100.0 fL   MCH 30.2 26.0 - 34.0 pg   MCHC 34.9 32.0 - 36.0 g/dL   RDW 15.1 (H) 11.5 - 14.5 %   Platelets 160 150 - 440 K/uL   Neutrophils Relative % 13 %   Neutro Abs 0.2 (L) 1.4 - 6.5 K/uL    Comment: CRITICAL RESULT CALLED TO, READ BACK BY AND VERIFIED WITH: BRENDA ELLINGTON ON 08/06/16 AT 1037 QSD    Lymphocytes Relative 52 %   Lymphs Abs 0.9 (L) 1.0 - 3.6 K/uL   Monocytes Relative 22 %   Monocytes Absolute 0.4 0.2 - 1.0 K/uL   Eosinophils Relative 10 %   Eosinophils Absolute 0.2 0 - 0.7 K/uL   Basophils Relative 3 %   Basophils Absolute 0.1 0 - 0.1 K/uL  CBC with Differential     Status: Abnormal   Collection Time: 08/13/16  8:30 AM  Result Value Ref Range   WBC 1.9 (L) 3.8 - 10.6 K/uL   RBC 3.36 (L) 4.40 - 5.90 MIL/uL   Hemoglobin 10.0 (L) 13.0 - 18.0 g/dL   HCT 28.7 (L) 40.0 - 52.0 %   MCV 85.3 80.0 - 100.0 fL   MCH 29.7 26.0 - 34.0 pg   MCHC 34.9 32.0 - 36.0 g/dL   RDW 15.2 (H) 11.5 - 14.5 %   Platelets 131 (L) 150 - 440 K/uL   Neutrophils Relative % 30 %   Neutro Abs 0.6 (L) 1.4 - 6.5 K/uL   Lymphocytes Relative 44 %   Lymphs Abs 0.8 (L) 1.0 - 3.6 K/uL   Monocytes Relative 15 %   Monocytes Absolute 0.3 0.2 - 1.0 K/uL   Eosinophils Relative 9 %   Eosinophils Absolute 0.2 0 - 0.7 K/uL   Basophils Relative 2 %   Basophils Absolute 0.0 0 - 0.1 K/uL  CBC with Differential     Status: Abnormal   Collection Time: 08/20/16  8:35 AM  Result Value Ref Range   WBC 2.0 (L) 3.8 - 10.6 K/uL   RBC 3.48 (L) 4.40 - 5.90 MIL/uL   Hemoglobin 10.5 (L) 13.0 - 18.0 g/dL   HCT 29.7 (L) 40.0 - 52.0 %   MCV 85.2 80.0 - 100.0 fL   MCH 30.2  26.0 - 34.0 pg   MCHC 35.4 32.0 - 36.0 g/dL   RDW 15.1 (H) 11.5 - 14.5 %   Platelets 141 (L) 150 - 440 K/uL   Neutrophils Relative % 36 %   Neutro Abs 0.7 (L) 1.4 - 6.5 K/uL   Lymphocytes Relative 38 %   Lymphs Abs 0.8 (L) 1.0 - 3.6 K/uL   Monocytes Relative 16 %   Monocytes Absolute 0.3 0.2 - 1.0 K/uL    Eosinophils Relative 7 %   Eosinophils Absolute 0.1 0 - 0.7 K/uL   Basophils Relative 3 %   Basophils Absolute 0.0 0 - 0.1 K/uL  CBC with Differential     Status: Abnormal   Collection Time: 08/27/16  8:15 AM  Result Value Ref Range   WBC 3.3 (L) 3.8 - 10.6 K/uL   RBC 3.51 (L) 4.40 - 5.90 MIL/uL   Hemoglobin 10.5 (L) 13.0 - 18.0 g/dL   HCT 29.6 (L) 40.0 - 52.0 %   MCV 84.3 80.0 - 100.0 fL   MCH 29.8 26.0 - 34.0 pg   MCHC 35.4 32.0 - 36.0 g/dL   RDW 14.8 (H) 11.5 - 14.5 %   Platelets 164 150 - 440 K/uL   Neutrophils Relative % 57 %   Neutro Abs 1.9 1.4 - 6.5 K/uL   Lymphocytes Relative 22 %   Lymphs Abs 0.7 (L) 1.0 - 3.6 K/uL   Monocytes Relative 14 %   Monocytes Absolute 0.5 0.2 - 1.0 K/uL   Eosinophils Relative 4 %   Eosinophils Absolute 0.1 0 - 0.7 K/uL   Basophils Relative 3 %   Basophils Absolute 0.1 0 - 0.1 K/uL  CBC with Differential     Status: Abnormal   Collection Time: 09/03/16  8:15 AM  Result Value Ref Range   WBC 4.9 3.8 - 10.6 K/uL   RBC 3.38 (L) 4.40 - 5.90 MIL/uL   Hemoglobin 10.1 (L) 13.0 - 18.0 g/dL   HCT 28.6 (L) 40.0 - 52.0 %   MCV 84.6 80.0 - 100.0 fL   MCH 29.7 26.0 - 34.0 pg   MCHC 35.1 32.0 - 36.0 g/dL   RDW 15.0 (H) 11.5 - 14.5 %   Platelets 172 150 - 440 K/uL   Neutrophils Relative % 72 %   Neutro Abs 3.5 1.4 - 6.5 K/uL   Lymphocytes Relative 17 %   Lymphs Abs 0.8 (L) 1.0 - 3.6 K/uL   Monocytes Relative 8 %   Monocytes Absolute 0.4 0.2 - 1.0 K/uL   Eosinophils Relative 3 %   Eosinophils Absolute 0.2 0 - 0.7 K/uL   Basophils Relative 0 %   Basophils Absolute 0.0 0 - 0.1 K/uL  CBC with Differential     Status: Abnormal   Collection Time: 09/10/16  8:13 AM  Result Value Ref Range   WBC 1.6 (L) 3.8 - 10.6 K/uL   RBC 3.50 (L) 4.40 - 5.90 MIL/uL   Hemoglobin 10.3 (L) 13.0 - 18.0 g/dL   HCT 29.5 (L) 40.0 - 52.0 %   MCV 84.5 80.0 - 100.0 fL   MCH 29.3 26.0 - 34.0 pg   MCHC 34.7 32.0 - 36.0 g/dL   RDW 14.7 (H) 11.5 - 14.5 %   Platelets 182  150 - 440 K/uL   Neutrophils Relative % 21 %   Neutro Abs 0.3 (L) 1.4 - 6.5 K/uL    Comment: RESULT REPEATED AND VERIFIED CRITICAL RESULT CALLED TO, READ BACK BY AND VERIFIED WITH: HEATHER J  7829 09/10/2016 PWB    Lymphocytes Relative 41 %   Lymphs Abs 0.6 (L) 1.0 - 3.6 K/uL   Monocytes Relative 23 %   Monocytes Absolute 0.4 0.2 - 1.0 K/uL   Eosinophils Relative 12 %   Eosinophils Absolute 0.2 0 - 0.7 K/uL   Basophils Relative 3 %   Basophils Absolute 0.0 0 - 0.1 K/uL  Ferritin     Status: None   Collection Time: 09/17/16  8:35 AM  Result Value Ref Range   Ferritin 100 24 - 336 ng/mL  Iron and TIBC     Status: Abnormal   Collection Time: 09/17/16  8:35 AM  Result Value Ref Range   Iron 36 (L) 45 - 182 ug/dL   TIBC 314 250 - 450 ug/dL   Saturation Ratios 12 (L) 17.9 - 39.5 %   UIBC 278 ug/dL  CBC with Differential     Status: Abnormal   Collection Time: 09/17/16  8:35 AM  Result Value Ref Range   WBC 4.1 3.8 - 10.6 K/uL   RBC 3.73 (L) 4.40 - 5.90 MIL/uL   Hemoglobin 10.8 (L) 13.0 - 18.0 g/dL   HCT 31.1 (L) 40.0 - 52.0 %   MCV 83.4 80.0 - 100.0 fL   MCH 29.1 26.0 - 34.0 pg   MCHC 34.8 32.0 - 36.0 g/dL   RDW 14.5 11.5 - 14.5 %   Platelets 170 150 - 440 K/uL   Neutrophils Relative % 59 %   Neutro Abs 2.5 1.4 - 6.5 K/uL   Lymphocytes Relative 25 %   Lymphs Abs 1.0 1.0 - 3.6 K/uL   Monocytes Relative 8 %   Monocytes Absolute 0.3 0.2 - 1.0 K/uL   Eosinophils Relative 6 %   Eosinophils Absolute 0.2 0 - 0.7 K/uL   Basophils Relative 2 %   Basophils Absolute 0.1 0 - 0.1 K/uL  CBC with Differential     Status: Abnormal   Collection Time: 09/24/16  8:13 AM  Result Value Ref Range   WBC 3.8 3.8 - 10.6 K/uL   RBC 3.67 (L) 4.40 - 5.90 MIL/uL   Hemoglobin 10.9 (L) 13.0 - 18.0 g/dL   HCT 30.9 (L) 40.0 - 52.0 %   MCV 84.2 80.0 - 100.0 fL   MCH 29.6 26.0 - 34.0 pg   MCHC 35.1 32.0 - 36.0 g/dL   RDW 14.6 (H) 11.5 - 14.5 %   Platelets 178 150 - 440 K/uL   Neutrophils Relative %  52 %   Neutro Abs 2.0 1.4 - 6.5 K/uL   Lymphocytes Relative 28 %   Lymphs Abs 1.1 1.0 - 3.6 K/uL   Monocytes Relative 9 %   Monocytes Absolute 0.4 0.2 - 1.0 K/uL   Eosinophils Relative 8 %   Eosinophils Absolute 0.3 0 - 0.7 K/uL   Basophils Relative 3 %   Basophils Absolute 0.1 0 - 0.1 K/uL  CBC with Differential     Status: Abnormal   Collection Time: 10/01/16  8:14 AM  Result Value Ref Range   WBC 1.7 (L) 3.8 - 10.6 K/uL   RBC 3.73 (L) 4.40 - 5.90 MIL/uL   Hemoglobin 11.1 (L) 13.0 - 18.0 g/dL   HCT 31.4 (L) 40.0 - 52.0 %   MCV 84.2 80.0 - 100.0 fL   MCH 29.6 26.0 - 34.0 pg   MCHC 35.2 32.0 - 36.0 g/dL   RDW 15.1 (H) 11.5 - 14.5 %   Platelets 183 150 - 440 K/uL  Neutrophils Relative % 6 %   Neutro Abs 0.1 (L) 1.4 - 6.5 K/uL    Comment: RESULT REPEATED AND VERIFIED CRITICAL RESULT CALLED TO, READ BACK BY AND VERIFIED WITH:  CALLED TO BRANDY MOYA @ 837 AM 10/01/2016 PWB    Lymphocytes Relative 57 %   Lymphs Abs 1.0 1.0 - 3.6 K/uL   Monocytes Relative 24 %   Monocytes Absolute 0.4 0.2 - 1.0 K/uL   Eosinophils Relative 9 %   Eosinophils Absolute 0.1 0 - 0.7 K/uL   Basophils Relative 4 %   Basophils Absolute 0.1 0 - 0.1 K/uL  CBC with Differential     Status: Abnormal   Collection Time: 10/08/16  9:25 AM  Result Value Ref Range   WBC 2.1 (L) 3.8 - 10.6 K/uL   RBC 3.83 (L) 4.40 - 5.90 MIL/uL   Hemoglobin 11.3 (L) 13.0 - 18.0 g/dL   HCT 32.0 (L) 40.0 - 52.0 %   MCV 83.5 80.0 - 100.0 fL   MCH 29.5 26.0 - 34.0 pg   MCHC 35.4 32.0 - 36.0 g/dL   RDW 14.9 (H) 11.5 - 14.5 %   Platelets 148 (L) 150 - 440 K/uL   Neutrophils Relative % 41 %   Neutro Abs 0.9 (L) 1.4 - 6.5 K/uL   Lymphocytes Relative 37 %   Lymphs Abs 0.8 (L) 1.0 - 3.6 K/uL   Monocytes Relative 12 %   Monocytes Absolute 0.2 0.2 - 1.0 K/uL   Eosinophils Relative 8 %   Eosinophils Absolute 0.2 0 - 0.7 K/uL   Basophils Relative 2 %   Basophils Absolute 0.0 0 - 0.1 K/uL     STUDIES: No results  found. 07/23/15 -  Bone Marrow Biopsy Report  -  nonspecific marrow findings with no diagnostic morphologic or immunophenotypic evidence of hematopoietic neoplasia. Mildly hypercellular marrow for age (40-50%) with myeloid hypoplasia, increased eosinophils, erythroid hyperplasia with mild nonspecific dyserythropoiesis and mild megakaryocytic atypia. Diffuse mild to focally moderate increase in reticulin. Storage iron present. Flow cytometry reports relatively decreased neutrophilic cells with no immunophenotypic abnormalities or increase in blasts, relatively increased eosinophils (17%) nonspecific. Cytogenetics normal 46XY. SNP microarray result is Normal Male.    ASSESSMENT / PLAN:   Other neutropenia ([place]) 1. Leukopenia/Neutropenia  of unclear origin-as previously stated, extensive workup including multiple bone marrow biopsies failed to reveal clear etiology of persistent neutropenia.  Currently tolerating well.   # Continue Granix every week/every 4 weeks- CBC.   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 Will reheck in 3 months  # CKD- stable.   Follow-up with me in 3 months/weeklyGranix. Above plan of care was discussed with the patient and family in detail. They agree.   Cammie Sickle, MD   10/08/2016 1:12 PM

## 2016-10-08 NOTE — Progress Notes (Signed)
Patient is here for follow up, no complaints  

## 2016-10-08 NOTE — Assessment & Plan Note (Signed)
1. Leukopenia/Neutropenia  of unclear origin-as previously stated, extensive workup including multiple bone marrow biopsies failed to reveal clear etiology of persistent neutropenia.  Currently tolerating well.   # Continue Granix every week/every 4 weeks- CBC.   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 Will reheck in 3 months  # CKD- stable.   Follow-up with me in 3 months/weeklyGranix. Above plan of care was discussed with the patient and family in detail. They agree.

## 2016-10-15 ENCOUNTER — Inpatient Hospital Stay: Payer: Medicare HMO

## 2016-10-15 DIAGNOSIS — D509 Iron deficiency anemia, unspecified: Secondary | ICD-10-CM | POA: Diagnosis not present

## 2016-10-15 DIAGNOSIS — D708 Other neutropenia: Secondary | ICD-10-CM

## 2016-10-15 MED ORDER — TBO-FILGRASTIM 480 MCG/0.8ML ~~LOC~~ SOSY
480.0000 ug | PREFILLED_SYRINGE | Freq: Once | SUBCUTANEOUS | Status: AC
  Administered 2016-10-15: 480 ug via SUBCUTANEOUS
  Filled 2016-10-15: qty 0.8

## 2016-10-22 ENCOUNTER — Telehealth: Payer: Self-pay

## 2016-10-22 ENCOUNTER — Inpatient Hospital Stay: Payer: Medicare HMO

## 2016-10-22 DIAGNOSIS — D509 Iron deficiency anemia, unspecified: Secondary | ICD-10-CM | POA: Diagnosis not present

## 2016-10-22 DIAGNOSIS — D708 Other neutropenia: Secondary | ICD-10-CM

## 2016-10-22 MED ORDER — TBO-FILGRASTIM 480 MCG/0.8ML ~~LOC~~ SOSY
480.0000 ug | PREFILLED_SYRINGE | Freq: Once | SUBCUTANEOUS | Status: AC
  Administered 2016-10-22: 480 ug via SUBCUTANEOUS
  Filled 2016-10-22: qty 0.8

## 2016-10-22 NOTE — Telephone Encounter (Signed)
Patient would like to inform Dr. Donneta RombergBrahmanday that he woke up with night sweats last Thursday night which was the same day as his last Granix injection.  He reports that he does have occasional night sweats after receiving the Granix injection.  Received injection today and he will contact the office if he has night sweats tonight.

## 2016-10-28 ENCOUNTER — Inpatient Hospital Stay: Payer: Medicare HMO

## 2016-10-28 DIAGNOSIS — D509 Iron deficiency anemia, unspecified: Secondary | ICD-10-CM | POA: Diagnosis not present

## 2016-10-28 DIAGNOSIS — D708 Other neutropenia: Secondary | ICD-10-CM

## 2016-10-28 MED ORDER — TBO-FILGRASTIM 480 MCG/0.8ML ~~LOC~~ SOSY
480.0000 ug | PREFILLED_SYRINGE | Freq: Once | SUBCUTANEOUS | Status: AC
  Administered 2016-10-28: 480 ug via SUBCUTANEOUS
  Filled 2016-10-28: qty 0.8

## 2016-11-05 ENCOUNTER — Inpatient Hospital Stay: Payer: Medicare HMO

## 2016-11-05 DIAGNOSIS — D509 Iron deficiency anemia, unspecified: Secondary | ICD-10-CM

## 2016-11-05 DIAGNOSIS — D708 Other neutropenia: Secondary | ICD-10-CM

## 2016-11-05 LAB — CBC WITH DIFFERENTIAL/PLATELET
BASOS PCT: 2 %
Basophils Absolute: 0.1 10*3/uL (ref 0–0.1)
EOS ABS: 0.2 10*3/uL (ref 0–0.7)
EOS PCT: 5 %
HCT: 31.6 % — ABNORMAL LOW (ref 40.0–52.0)
Hemoglobin: 10.8 g/dL — ABNORMAL LOW (ref 13.0–18.0)
Lymphocytes Relative: 32 %
Lymphs Abs: 1.3 10*3/uL (ref 1.0–3.6)
MCH: 28.8 pg (ref 26.0–34.0)
MCHC: 34.3 g/dL (ref 32.0–36.0)
MCV: 84.1 fL (ref 80.0–100.0)
MONO ABS: 0.5 10*3/uL (ref 0.2–1.0)
MONOS PCT: 12 %
Neutro Abs: 2.1 10*3/uL (ref 1.4–6.5)
Neutrophils Relative %: 49 %
Platelets: 196 10*3/uL (ref 150–440)
RBC: 3.76 MIL/uL — ABNORMAL LOW (ref 4.40–5.90)
RDW: 14.8 % — AB (ref 11.5–14.5)
WBC: 4.2 10*3/uL (ref 3.8–10.6)

## 2016-11-12 ENCOUNTER — Inpatient Hospital Stay: Payer: Medicare HMO | Attending: Internal Medicine

## 2016-11-12 DIAGNOSIS — Z79899 Other long term (current) drug therapy: Secondary | ICD-10-CM | POA: Insufficient documentation

## 2016-11-12 DIAGNOSIS — D709 Neutropenia, unspecified: Secondary | ICD-10-CM | POA: Insufficient documentation

## 2016-11-12 DIAGNOSIS — D708 Other neutropenia: Secondary | ICD-10-CM

## 2016-11-12 MED ORDER — TBO-FILGRASTIM 480 MCG/0.8ML ~~LOC~~ SOSY
480.0000 ug | PREFILLED_SYRINGE | Freq: Once | SUBCUTANEOUS | Status: AC
  Administered 2016-11-12: 480 ug via SUBCUTANEOUS
  Filled 2016-11-12: qty 0.8

## 2016-11-18 ENCOUNTER — Inpatient Hospital Stay: Payer: Medicare HMO

## 2016-11-18 DIAGNOSIS — D708 Other neutropenia: Secondary | ICD-10-CM

## 2016-11-18 DIAGNOSIS — D709 Neutropenia, unspecified: Secondary | ICD-10-CM | POA: Diagnosis not present

## 2016-11-18 MED ORDER — TBO-FILGRASTIM 480 MCG/0.8ML ~~LOC~~ SOSY
480.0000 ug | PREFILLED_SYRINGE | Freq: Once | SUBCUTANEOUS | Status: AC
  Administered 2016-11-18: 480 ug via SUBCUTANEOUS
  Filled 2016-11-18: qty 0.8

## 2016-11-19 ENCOUNTER — Inpatient Hospital Stay: Payer: Medicare HMO

## 2016-11-26 ENCOUNTER — Inpatient Hospital Stay: Payer: Medicare HMO

## 2016-11-26 DIAGNOSIS — D709 Neutropenia, unspecified: Secondary | ICD-10-CM | POA: Diagnosis not present

## 2016-11-26 DIAGNOSIS — D708 Other neutropenia: Secondary | ICD-10-CM

## 2016-11-26 MED ORDER — TBO-FILGRASTIM 480 MCG/0.8ML ~~LOC~~ SOSY
480.0000 ug | PREFILLED_SYRINGE | Freq: Once | SUBCUTANEOUS | Status: AC
  Administered 2016-11-26: 480 ug via SUBCUTANEOUS
  Filled 2016-11-26: qty 0.8

## 2016-12-03 ENCOUNTER — Inpatient Hospital Stay: Payer: Medicare HMO

## 2016-12-03 DIAGNOSIS — D708 Other neutropenia: Secondary | ICD-10-CM

## 2016-12-03 DIAGNOSIS — D709 Neutropenia, unspecified: Secondary | ICD-10-CM | POA: Diagnosis not present

## 2016-12-03 DIAGNOSIS — D509 Iron deficiency anemia, unspecified: Secondary | ICD-10-CM

## 2016-12-03 LAB — CBC WITH DIFFERENTIAL/PLATELET
BASOS ABS: 0.1 10*3/uL (ref 0–0.1)
BASOS PCT: 2 %
Eosinophils Absolute: 0.1 10*3/uL (ref 0–0.7)
Eosinophils Relative: 2 %
HEMATOCRIT: 31.9 % — AB (ref 40.0–52.0)
Hemoglobin: 11 g/dL — ABNORMAL LOW (ref 13.0–18.0)
Lymphocytes Relative: 29 %
Lymphs Abs: 1 10*3/uL (ref 1.0–3.6)
MCH: 29 pg (ref 26.0–34.0)
MCHC: 34.5 g/dL (ref 32.0–36.0)
MCV: 84.1 fL (ref 80.0–100.0)
MONO ABS: 0.8 10*3/uL (ref 0.2–1.0)
Monocytes Relative: 24 %
NEUTROS ABS: 1.5 10*3/uL (ref 1.4–6.5)
Neutrophils Relative %: 43 %
PLATELETS: 184 10*3/uL (ref 150–440)
RBC: 3.79 MIL/uL — ABNORMAL LOW (ref 4.40–5.90)
RDW: 15 % — AB (ref 11.5–14.5)
WBC: 3.3 10*3/uL — ABNORMAL LOW (ref 3.8–10.6)

## 2016-12-10 ENCOUNTER — Inpatient Hospital Stay: Payer: Medicare HMO | Attending: Internal Medicine

## 2016-12-10 DIAGNOSIS — D708 Other neutropenia: Secondary | ICD-10-CM

## 2016-12-10 DIAGNOSIS — D709 Neutropenia, unspecified: Secondary | ICD-10-CM | POA: Insufficient documentation

## 2016-12-10 DIAGNOSIS — E1151 Type 2 diabetes mellitus with diabetic peripheral angiopathy without gangrene: Secondary | ICD-10-CM | POA: Diagnosis not present

## 2016-12-10 DIAGNOSIS — G4730 Sleep apnea, unspecified: Secondary | ICD-10-CM | POA: Insufficient documentation

## 2016-12-10 DIAGNOSIS — Z7689 Persons encountering health services in other specified circumstances: Secondary | ICD-10-CM | POA: Insufficient documentation

## 2016-12-10 DIAGNOSIS — Z79899 Other long term (current) drug therapy: Secondary | ICD-10-CM | POA: Insufficient documentation

## 2016-12-10 DIAGNOSIS — Z7982 Long term (current) use of aspirin: Secondary | ICD-10-CM | POA: Diagnosis not present

## 2016-12-10 DIAGNOSIS — I129 Hypertensive chronic kidney disease with stage 1 through stage 4 chronic kidney disease, or unspecified chronic kidney disease: Secondary | ICD-10-CM | POA: Diagnosis not present

## 2016-12-10 DIAGNOSIS — D509 Iron deficiency anemia, unspecified: Secondary | ICD-10-CM | POA: Diagnosis not present

## 2016-12-10 DIAGNOSIS — D631 Anemia in chronic kidney disease: Secondary | ICD-10-CM | POA: Insufficient documentation

## 2016-12-10 DIAGNOSIS — R197 Diarrhea, unspecified: Secondary | ICD-10-CM | POA: Diagnosis not present

## 2016-12-10 DIAGNOSIS — I2510 Atherosclerotic heart disease of native coronary artery without angina pectoris: Secondary | ICD-10-CM | POA: Insufficient documentation

## 2016-12-10 DIAGNOSIS — N189 Chronic kidney disease, unspecified: Secondary | ICD-10-CM | POA: Diagnosis not present

## 2016-12-10 DIAGNOSIS — K30 Functional dyspepsia: Secondary | ICD-10-CM | POA: Insufficient documentation

## 2016-12-10 MED ORDER — TBO-FILGRASTIM 480 MCG/0.8ML ~~LOC~~ SOSY
480.0000 ug | PREFILLED_SYRINGE | Freq: Once | SUBCUTANEOUS | Status: AC
  Administered 2016-12-10: 480 ug via SUBCUTANEOUS
  Filled 2016-12-10: qty 0.8

## 2016-12-17 ENCOUNTER — Inpatient Hospital Stay: Payer: Medicare HMO

## 2016-12-17 DIAGNOSIS — D709 Neutropenia, unspecified: Secondary | ICD-10-CM | POA: Diagnosis not present

## 2016-12-17 DIAGNOSIS — D708 Other neutropenia: Secondary | ICD-10-CM

## 2016-12-17 MED ORDER — TBO-FILGRASTIM 480 MCG/0.8ML ~~LOC~~ SOSY
480.0000 ug | PREFILLED_SYRINGE | Freq: Once | SUBCUTANEOUS | Status: AC
  Administered 2016-12-17: 480 ug via SUBCUTANEOUS
  Filled 2016-12-17: qty 0.8

## 2016-12-24 ENCOUNTER — Inpatient Hospital Stay: Payer: Medicare HMO

## 2016-12-25 ENCOUNTER — Inpatient Hospital Stay: Payer: Medicare HMO

## 2016-12-25 DIAGNOSIS — D708 Other neutropenia: Secondary | ICD-10-CM

## 2016-12-25 DIAGNOSIS — D709 Neutropenia, unspecified: Secondary | ICD-10-CM | POA: Diagnosis not present

## 2016-12-25 MED ORDER — TBO-FILGRASTIM 480 MCG/0.8ML ~~LOC~~ SOSY
480.0000 ug | PREFILLED_SYRINGE | Freq: Once | SUBCUTANEOUS | Status: AC
  Administered 2016-12-25: 480 ug via SUBCUTANEOUS
  Filled 2016-12-25: qty 0.8

## 2016-12-31 ENCOUNTER — Inpatient Hospital Stay (HOSPITAL_BASED_OUTPATIENT_CLINIC_OR_DEPARTMENT_OTHER): Payer: Medicare HMO | Admitting: Internal Medicine

## 2016-12-31 ENCOUNTER — Inpatient Hospital Stay: Payer: Medicare HMO

## 2016-12-31 VITALS — BP 155/68 | HR 70 | Temp 98.0°F | Wt 209.1 lb

## 2016-12-31 DIAGNOSIS — D708 Other neutropenia: Secondary | ICD-10-CM

## 2016-12-31 DIAGNOSIS — R197 Diarrhea, unspecified: Secondary | ICD-10-CM | POA: Diagnosis not present

## 2016-12-31 DIAGNOSIS — Z7982 Long term (current) use of aspirin: Secondary | ICD-10-CM

## 2016-12-31 DIAGNOSIS — K30 Functional dyspepsia: Secondary | ICD-10-CM

## 2016-12-31 DIAGNOSIS — G4730 Sleep apnea, unspecified: Secondary | ICD-10-CM

## 2016-12-31 DIAGNOSIS — D509 Iron deficiency anemia, unspecified: Secondary | ICD-10-CM

## 2016-12-31 DIAGNOSIS — D709 Neutropenia, unspecified: Secondary | ICD-10-CM

## 2016-12-31 DIAGNOSIS — I129 Hypertensive chronic kidney disease with stage 1 through stage 4 chronic kidney disease, or unspecified chronic kidney disease: Secondary | ICD-10-CM | POA: Diagnosis not present

## 2016-12-31 DIAGNOSIS — N189 Chronic kidney disease, unspecified: Secondary | ICD-10-CM

## 2016-12-31 DIAGNOSIS — I2510 Atherosclerotic heart disease of native coronary artery without angina pectoris: Secondary | ICD-10-CM

## 2016-12-31 DIAGNOSIS — D631 Anemia in chronic kidney disease: Secondary | ICD-10-CM

## 2016-12-31 DIAGNOSIS — E1151 Type 2 diabetes mellitus with diabetic peripheral angiopathy without gangrene: Secondary | ICD-10-CM

## 2016-12-31 DIAGNOSIS — Z79899 Other long term (current) drug therapy: Secondary | ICD-10-CM

## 2016-12-31 LAB — CBC WITH DIFFERENTIAL/PLATELET
Basophils Absolute: 0 10*3/uL (ref 0–0.1)
Basophils Relative: 1 %
Eosinophils Absolute: 0 10*3/uL (ref 0–0.7)
Eosinophils Relative: 1 %
HEMATOCRIT: 32.1 % — AB (ref 40.0–52.0)
HEMOGLOBIN: 11.3 g/dL — AB (ref 13.0–18.0)
LYMPHS ABS: 0.6 10*3/uL — AB (ref 1.0–3.6)
LYMPHS PCT: 29 %
MCH: 29 pg (ref 26.0–34.0)
MCHC: 35.1 g/dL (ref 32.0–36.0)
MCV: 82.7 fL (ref 80.0–100.0)
Monocytes Absolute: 0.5 10*3/uL (ref 0.2–1.0)
Monocytes Relative: 28 %
NEUTROS ABS: 0.8 10*3/uL — AB (ref 1.4–6.5)
NEUTROS PCT: 41 %
Platelets: 160 10*3/uL (ref 150–440)
RBC: 3.88 MIL/uL — ABNORMAL LOW (ref 4.40–5.90)
RDW: 14.9 % — ABNORMAL HIGH (ref 11.5–14.5)
WBC: 1.9 10*3/uL — ABNORMAL LOW (ref 3.8–10.6)

## 2016-12-31 MED ORDER — TBO-FILGRASTIM 480 MCG/0.8ML ~~LOC~~ SOSY
480.0000 ug | PREFILLED_SYRINGE | Freq: Once | SUBCUTANEOUS | Status: AC
  Administered 2016-12-31: 480 ug via SUBCUTANEOUS
  Filled 2016-12-31: qty 0.8

## 2016-12-31 NOTE — Progress Notes (Signed)
[place]  [phone] [phone]     ID: [name] OB: 1941-07-15  MR#: [id number]  CSN#:[id number]  Patient Care Team: Jodi Marble, MD as PCP - General (Internal Medicine)  CHIEF COMPLAINT/DIAGNOSIS:  Leukopenia/Neutropenia, Mild Anemia - Mild, asymptomatic, of unclear etiology. Workup so far unremarkable as detailed below.  Bone marrow biopsy 05/10/13 - no diagnostic morphologic evidence of B-cell lymphoproliferative disorder or other hematopoietic neoplasia identified.  Normocellular to slightly hypercellular marrow for age 70-50% with maturing trilineage hematopoiesis, storage iron present, slight patchy increase in reticulin  Flow study negative for any B-cell monoclonality, nonspecific myeloid and normocytic findings with no increase in blasts,relatively increased eosinophils 7% is nonspecific,relatively increased normal B-cell precursors (hematogones)and.  Normal male karyotype, 46XY.  Labs done on 11/02/12 - Hb 13.3, platelets 172K, retic 0.036, WBC 3400 with 38% neutrophils, 35% lymphocytes, 10% variant lymphocytes, 12% monocytes. Iron study, B12, folate, ANA, serum immunoelectrophoresis (SIEP), PT, PTT, LDH, haptoglobin, Coombs test, HBsAg, HCV antibody, and HIV antibody all unremarkable.   05/03/13 - Peripheral blood Flow Cytometry. A very small (0.3% of leukocytes) B cell clone is detected. Previous phenotyping results showed similar findings. The clone is very small and is not diagnostic of any specific type of lymphoma. If the patient has a previous history of lymphoma, the finding is consistent with persistent involvement by lymphoma. 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  07/23/15 -  Bone Marrow Biopsy Report  -  nonspecific marrow findings with no diagnostic morphologic or immunophenotypic evidence of hematopoietic neoplasia. Mildly hypercellular marrow for age (40-50%) with myeloid hypoplasia,  increased eosinophils, erythroid hyperplasia with mild nonspecific dyserythropoiesis and mild megakaryocytic atypia. Diffuse mild to focally moderate increase in reticulin. Storage iron present. Flow cytometry reports relatively decreased neutrophilic cells with no immunophenotypic abnormalities or increase in blasts, relatively increased eosinophils (17%) nonspecific. Cytogenetics normal 46XY. SNP microarray result is Normal Male. The treatment with short course of steroids was attempted in November 2016, however there was no response. Treatment with G-CSF was initiated in early December 2016  HISTORY OF PRESENT ILLNESS:   76 year old male patient with a history of neutropenia of unclear etiology/ currently on granix weekly is here for follow-up.  Patient disorder diarrhea and stomach upset after eating food at the [employer]. Currently improved. No blood in stools.  Patient denies any recent infections. He did not need any antibiotics. No recent hospitalizations.  No weight loss. No night sweats.Denies any significant fatigue.   REVIEW  OF SYSTEMS:   ROS: As in HPI above. In addition, no new headaches or focal weakness.  No sore throat, cough, shortness of breath, sputum, hemoptysis or chest pain. No abdominal pain, constipation, diarrhea, dysuria or hematuria.   PAST MEDICAL HISTORY: Reviewed.         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 As above.  FAMILY HISTORY: Reviewed. Remarkable for diabetes, heart disease, hypertension, breast cancer.   SOCIAL HISTORY: Reviewed. Nonsmoker.  Denies alcohol or recreational drug usage.    Allergies  Allergen Reactions  . Pollen Extract Other (See Comments)    Runny nose    Current Outpatient Prescriptions  Medication Sig Dispense  Refill  . amLODipine (NORVASC) 10 MG tablet Take 10 mg by mouth daily.    Marland Kitchen aspirin 81 MG  tablet Take 81 mg by mouth daily.    Marland Kitchen atorvastatin (LIPITOR) 80 MG tablet Take 80 mg by mouth daily.    . carvedilol (COREG) 6.25 MG tablet Take 6.25 mg by mouth 2 (two) times daily with a meal.    . cloNIDine (CATAPRES) 0.3 MG tablet Take 0.3 mg by mouth 2 (two) times daily.    . clopidogrel (PLAVIX) 75 MG tablet Take 75 mg by mouth daily.    . Dulaglutide (TRULICITY [place]) Inject 1.5 Units into the skin daily.     . fluticasone (FLONASE) 50 MCG/ACT nasal spray Place 1 spray into both nostrils daily.    Marland Kitchen gabapentin (NEURONTIN) 300 MG capsule Take 1 capsule (300 mg total) by mouth 3 (three) times daily. 90 capsule 0  . glipiZIDE (GLUCOTROL) 5 MG tablet Take 2.5 mg by mouth 2 (two) times daily.    . isosorbide mononitrate (IMDUR) 60 MG 24 hr tablet Take 60 mg by mouth daily.    . nitroGLYCERIN (NITROSTAT) 0.4 MG SL tablet Place 0.4 mg under the tongue every 5 (five) minutes as needed for chest pain.    . ONE TOUCH ULTRA TEST test strip use as directed;  twice a day OR MORE if needed  1   No current facility-administered medications for this visit.     PHYSICAL EXAM: Vitals:   12/31/16 1112  BP: (!) 155/68  Pulse: 70  Temp: 98 F (36.7 C)     Body mass index is 28.05 kg/m.     BP (!) 155/68 (BP Location: Right Arm, Patient Position: Sitting)   Pulse 70   Temp 98 F (36.7 C) (Tympanic)   Wt 209 lb 2 oz (94.9 kg)   BMI 28.05 kg/m   General Appearance:    Alert, cooperative, no distress, appears stated age;  accompanied by his wife   Head:    Normocephalic, without obvious abnormality, atraumatic  Eyes:    PERRL, conjunctiva/corneas clear, EOM's intact, fundi    benign, both eyes       Ears:    Normal TM's and external ear canals, both ears  Nose:   Nares normal, septum midline, mucosa normal, no drainage   or sinus tenderness  Throat:   Lips, mucosa, and tongue normal; teeth and gums normal   Neck:   Supple, symmetrical, trachea midline, no adenopathy;       thyroid:  No enlargement/tenderness/nodules; carotid   bruit on L  Back:     Symmetric, no curvature, ROM normal, no CVA tenderness  Lungs:     Clear to auscultation bilaterally, respirations unlabored  Chest wall:    No tenderness or deformity  Heart:    Regular rate and rhythm, S1 and S2 normal, systolic ejection murmur 3 out of 6 on aorta   Abdomen:     Soft, non-tender, bowel sounds active all four quadrants,    no masses, no organomegaly  Extremities:   Extremities normal, atraumatic, no cyanosis or edema  Pulses:   2+ and symmetric all extremities  Skin:   Skin color, texture, turgor normal, no rashes or lesions  Lymph nodes:   Cervical, supraclavicular, and axillary nodes normal  Neurologic:   CNII-XII intact. Normal strength, sensation and reflexes      throughout     LAB RESULTS: Recent Results (from the past 2160 hour(s))  CBC with Differential     Status: Abnormal   Collection Time: 10/08/16  9:25 AM  Result Value Ref Range   WBC 2.1 (  L) 3.8 - 10.6 K/uL   RBC 3.83 (L) 4.40 - 5.90 MIL/uL   Hemoglobin 11.3 (L) 13.0 - 18.0 g/dL   HCT 32.0 (L) 40.0 - 52.0 %   MCV 83.5 80.0 - 100.0 fL   MCH 29.5 26.0 - 34.0 pg   MCHC 35.4 32.0 - 36.0 g/dL   RDW 14.9 (H) 11.5 - 14.5 %   Platelets 148 (L) 150 - 440 K/uL   Neutrophils Relative % 41 %   Neutro Abs 0.9 (L) 1.4 - 6.5 K/uL   Lymphocytes Relative 37 %   Lymphs Abs 0.8 (L) 1.0 - 3.6 K/uL   Monocytes Relative 12 %   Monocytes Absolute 0.2 0.2 - 1.0 K/uL   Eosinophils Relative 8 %   Eosinophils Absolute 0.2 0 - 0.7 K/uL   Basophils Relative 2 %   Basophils Absolute 0.0 0 - 0.1 K/uL  CBC with Differential     Status: Abnormal   Collection Time: 11/05/16 10:30 AM  Result Value Ref Range   WBC 4.2 3.8 - 10.6 K/uL   RBC 3.76 (L) 4.40 - 5.90 MIL/uL   Hemoglobin 10.8 (L) 13.0 - 18.0 g/dL   HCT 31.6 (L) 40.0 - 52.0 %   MCV 84.1 80.0 - 100.0 fL   MCH 28.8 26.0 -  34.0 pg   MCHC 34.3 32.0 - 36.0 g/dL   RDW 14.8 (H) 11.5 - 14.5 %   Platelets 196 150 - 440 K/uL   Neutrophils Relative % 49 %   Neutro Abs 2.1 1.4 - 6.5 K/uL   Lymphocytes Relative 32 %   Lymphs Abs 1.3 1.0 - 3.6 K/uL   Monocytes Relative 12 %   Monocytes Absolute 0.5 0.2 - 1.0 K/uL   Eosinophils Relative 5 %   Eosinophils Absolute 0.2 0 - 0.7 K/uL   Basophils Relative 2 %   Basophils Absolute 0.1 0 - 0.1 K/uL  CBC with Differential     Status: Abnormal   Collection Time: 12/03/16 10:10 AM  Result Value Ref Range   WBC 3.3 (L) 3.8 - 10.6 K/uL   RBC 3.79 (L) 4.40 - 5.90 MIL/uL   Hemoglobin 11.0 (L) 13.0 - 18.0 g/dL   HCT 31.9 (L) 40.0 - 52.0 %   MCV 84.1 80.0 - 100.0 fL   MCH 29.0 26.0 - 34.0 pg   MCHC 34.5 32.0 - 36.0 g/dL   RDW 15.0 (H) 11.5 - 14.5 %   Platelets 184 150 - 440 K/uL   Neutrophils Relative % 43 %   Neutro Abs 1.5 1.4 - 6.5 K/uL   Lymphocytes Relative 29 %   Lymphs Abs 1.0 1.0 - 3.6 K/uL   Monocytes Relative 24 %   Monocytes Absolute 0.8 0.2 - 1.0 K/uL   Eosinophils Relative 2 %   Eosinophils Absolute 0.1 0 - 0.7 K/uL   Basophils Relative 2 %   Basophils Absolute 0.1 0 - 0.1 K/uL  CBC with Differential     Status: Abnormal   Collection Time: 12/31/16 10:10 AM  Result Value Ref Range   WBC 1.9 (L) 3.8 - 10.6 K/uL   RBC 3.88 (L) 4.40 - 5.90 MIL/uL   Hemoglobin 11.3 (L) 13.0 - 18.0 g/dL   HCT 32.1 (L) 40.0 - 52.0 %   MCV 82.7 80.0 - 100.0 fL   MCH 29.0 26.0 - 34.0 pg   MCHC 35.1 32.0 - 36.0 g/dL   RDW 14.9 (H) 11.5 - 14.5 %   Platelets 160 150 - 440  K/uL   Neutrophils Relative % 41 %   Neutro Abs 0.8 (L) 1.4 - 6.5 K/uL   Lymphocytes Relative 29 %   Lymphs Abs 0.6 (L) 1.0 - 3.6 K/uL   Monocytes Relative 28 %   Monocytes Absolute 0.5 0.2 - 1.0 K/uL   Eosinophils Relative 1 %   Eosinophils Absolute 0.0 0 - 0.7 K/uL   Basophils Relative 1 %   Basophils Absolute 0.0 0 - 0.1 K/uL     STUDIES: No results found. 07/23/15 -  Bone Marrow Biopsy Report   -  nonspecific marrow findings with no diagnostic morphologic or immunophenotypic evidence of hematopoietic neoplasia. Mildly hypercellular marrow for age (40-50%) with myeloid hypoplasia, increased eosinophils, erythroid hyperplasia with mild nonspecific dyserythropoiesis and mild megakaryocytic atypia. Diffuse mild to focally moderate increase in reticulin. Storage iron present. Flow cytometry reports relatively decreased neutrophilic cells with no immunophenotypic abnormalities or increase in blasts, relatively increased eosinophils (17%) nonspecific. Cytogenetics normal 46XY. SNP microarray result is Normal Male.    ASSESSMENT / PLAN:   Other neutropenia ([place]) 1. Leukopenia/Neutropenia  of unclear origin-as previously stated, extensive workup including multiple bone marrow biopsies failed to reveal clear etiology of persistent neutropenia.  Currently tolerating well.   # Continue Granix every week/every 4 weeks- CBC- today ANC 800- proceed with Granix.   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 Stable.  # CKD- stable.   # Stomach discomfort- ? Food related.if worse to let us know.   Follow-up with me in 3 months/weeklyGranix. Above plan of care was discussed with the patientin detail. He  agree.   Cammie Sickle, MD   01/01/2017 6:07 PM

## 2016-12-31 NOTE — Progress Notes (Signed)
Patient here today for follow up.  Patient had some abdominal pain after dinner last night, then had some chills with tempeture of 101 orally

## 2016-12-31 NOTE — Assessment & Plan Note (Addendum)
1. Leukopenia/Neutropenia  of unclear origin-as previously stated, extensive workup including multiple bone marrow biopsies failed to reveal clear etiology of persistent neutropenia.  Currently tolerating well.   # Continue Granix every week/every 4 weeks- CBC- today ANC 800- proceed with Granix.   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 Stable.  # CKD- stable.   # Stomach discomfort- ? Food related.if worse to let us know.   Follow-up with me in 3 months/weeklyGranix. Above plan of care was discussed with the patientin detail. He  agree.

## 2017-01-07 ENCOUNTER — Inpatient Hospital Stay: Payer: Medicare HMO | Attending: Internal Medicine

## 2017-01-07 DIAGNOSIS — D709 Neutropenia, unspecified: Secondary | ICD-10-CM | POA: Insufficient documentation

## 2017-01-07 DIAGNOSIS — D708 Other neutropenia: Secondary | ICD-10-CM

## 2017-01-07 MED ORDER — TBO-FILGRASTIM 480 MCG/0.8ML ~~LOC~~ SOSY
480.0000 ug | PREFILLED_SYRINGE | Freq: Once | SUBCUTANEOUS | Status: AC
  Administered 2017-01-07: 480 ug via SUBCUTANEOUS
  Filled 2017-01-07: qty 0.8

## 2017-01-14 ENCOUNTER — Inpatient Hospital Stay: Payer: Medicare HMO

## 2017-01-14 DIAGNOSIS — D709 Neutropenia, unspecified: Secondary | ICD-10-CM | POA: Diagnosis not present

## 2017-01-14 DIAGNOSIS — D708 Other neutropenia: Secondary | ICD-10-CM

## 2017-01-14 MED ORDER — TBO-FILGRASTIM 480 MCG/0.8ML ~~LOC~~ SOSY
480.0000 ug | PREFILLED_SYRINGE | Freq: Once | SUBCUTANEOUS | Status: AC
  Administered 2017-01-14: 480 ug via SUBCUTANEOUS
  Filled 2017-01-14: qty 0.8

## 2017-01-21 ENCOUNTER — Inpatient Hospital Stay: Payer: Medicare HMO

## 2017-01-21 DIAGNOSIS — D708 Other neutropenia: Secondary | ICD-10-CM

## 2017-01-21 DIAGNOSIS — D709 Neutropenia, unspecified: Secondary | ICD-10-CM | POA: Diagnosis not present

## 2017-01-21 MED ORDER — TBO-FILGRASTIM 480 MCG/0.8ML ~~LOC~~ SOSY
480.0000 ug | PREFILLED_SYRINGE | Freq: Once | SUBCUTANEOUS | Status: AC
  Administered 2017-01-21: 480 ug via SUBCUTANEOUS
  Filled 2017-01-21: qty 0.8

## 2017-01-28 ENCOUNTER — Inpatient Hospital Stay: Payer: Medicare HMO

## 2017-01-28 ENCOUNTER — Other Ambulatory Visit: Payer: Self-pay | Admitting: Internal Medicine

## 2017-01-28 DIAGNOSIS — D509 Iron deficiency anemia, unspecified: Secondary | ICD-10-CM

## 2017-01-28 DIAGNOSIS — D708 Other neutropenia: Secondary | ICD-10-CM

## 2017-01-28 DIAGNOSIS — D709 Neutropenia, unspecified: Secondary | ICD-10-CM | POA: Diagnosis not present

## 2017-01-28 LAB — BASIC METABOLIC PANEL
Anion gap: 8 (ref 5–15)
BUN: 29 mg/dL — ABNORMAL HIGH (ref 6–20)
CALCIUM: 8.6 mg/dL — AB (ref 8.9–10.3)
CO2: 23 mmol/L (ref 22–32)
CREATININE: 1.65 mg/dL — AB (ref 0.61–1.24)
Chloride: 103 mmol/L (ref 101–111)
GFR calc non Af Amer: 40 mL/min — ABNORMAL LOW (ref >60)
GFR, EST AFRICAN AMERICAN: 46 mL/min — AB (ref >60)
Glucose, Bld: 229 mg/dL — ABNORMAL HIGH (ref 65–99)
Potassium: 4 mmol/L (ref 3.5–5.1)
SODIUM: 134 mmol/L — AB (ref 135–145)

## 2017-01-28 LAB — CBC WITH DIFFERENTIAL/PLATELET
Basophils Absolute: 0 10*3/uL (ref 0–0.1)
Basophils Relative: 2 %
Eosinophils Absolute: 0.1 10*3/uL (ref 0–0.7)
Eosinophils Relative: 4 %
HEMATOCRIT: 34.6 % — AB (ref 40.0–52.0)
Hemoglobin: 11.9 g/dL — ABNORMAL LOW (ref 13.0–18.0)
LYMPHS ABS: 0.9 10*3/uL — AB (ref 1.0–3.6)
LYMPHS PCT: 33 %
MCH: 28.7 pg (ref 26.0–34.0)
MCHC: 34.2 g/dL (ref 32.0–36.0)
MCV: 83.7 fL (ref 80.0–100.0)
MONO ABS: 0.4 10*3/uL (ref 0.2–1.0)
MONOS PCT: 14 %
NEUTROS ABS: 1.3 10*3/uL — AB (ref 1.4–6.5)
Neutrophils Relative %: 47 %
Platelets: 170 10*3/uL (ref 150–440)
RBC: 4.14 MIL/uL — ABNORMAL LOW (ref 4.40–5.90)
RDW: 15.8 % — AB (ref 11.5–14.5)
WBC: 2.8 10*3/uL — ABNORMAL LOW (ref 3.8–10.6)

## 2017-01-28 LAB — FERRITIN: Ferritin: 78 ng/mL (ref 24–336)

## 2017-01-28 LAB — IRON AND TIBC
Iron: 19 ug/dL — ABNORMAL LOW (ref 45–182)
SATURATION RATIOS: 6 % — AB (ref 17.9–39.5)
TIBC: 333 ug/dL (ref 250–450)
UIBC: 314 ug/dL

## 2017-01-28 MED ORDER — TBO-FILGRASTIM 480 MCG/0.8ML ~~LOC~~ SOSY
480.0000 ug | PREFILLED_SYRINGE | Freq: Once | SUBCUTANEOUS | Status: AC
  Administered 2017-01-28: 480 ug via SUBCUTANEOUS
  Filled 2017-01-28: qty 0.8

## 2017-02-04 ENCOUNTER — Inpatient Hospital Stay: Payer: Medicare HMO | Attending: Internal Medicine

## 2017-02-04 DIAGNOSIS — D709 Neutropenia, unspecified: Secondary | ICD-10-CM | POA: Diagnosis present

## 2017-02-04 DIAGNOSIS — D708 Other neutropenia: Secondary | ICD-10-CM

## 2017-02-04 MED ORDER — TBO-FILGRASTIM 480 MCG/0.8ML ~~LOC~~ SOSY
480.0000 ug | PREFILLED_SYRINGE | Freq: Once | SUBCUTANEOUS | Status: AC
Start: 2017-02-04 — End: 2017-02-04
  Administered 2017-02-04: 480 ug via SUBCUTANEOUS
  Filled 2017-02-04: qty 0.8

## 2017-02-11 ENCOUNTER — Inpatient Hospital Stay: Payer: Medicare HMO

## 2017-02-11 DIAGNOSIS — D708 Other neutropenia: Secondary | ICD-10-CM

## 2017-02-11 DIAGNOSIS — D709 Neutropenia, unspecified: Secondary | ICD-10-CM | POA: Diagnosis not present

## 2017-02-11 MED ORDER — TBO-FILGRASTIM 480 MCG/0.8ML ~~LOC~~ SOSY
480.0000 ug | PREFILLED_SYRINGE | Freq: Once | SUBCUTANEOUS | Status: AC
Start: 2017-02-11 — End: 2017-02-11
  Administered 2017-02-11: 480 ug via SUBCUTANEOUS
  Filled 2017-02-11: qty 0.8

## 2017-02-18 ENCOUNTER — Inpatient Hospital Stay: Payer: Medicare HMO

## 2017-02-18 DIAGNOSIS — D708 Other neutropenia: Secondary | ICD-10-CM

## 2017-02-18 DIAGNOSIS — D709 Neutropenia, unspecified: Secondary | ICD-10-CM | POA: Diagnosis not present

## 2017-02-18 MED ORDER — TBO-FILGRASTIM 480 MCG/0.8ML ~~LOC~~ SOSY
480.0000 ug | PREFILLED_SYRINGE | Freq: Once | SUBCUTANEOUS | Status: AC
  Administered 2017-02-18: 480 ug via SUBCUTANEOUS
  Filled 2017-02-18: qty 0.8

## 2017-02-25 ENCOUNTER — Inpatient Hospital Stay: Payer: Medicare HMO

## 2017-02-25 DIAGNOSIS — D708 Other neutropenia: Secondary | ICD-10-CM

## 2017-02-25 DIAGNOSIS — D709 Neutropenia, unspecified: Secondary | ICD-10-CM | POA: Diagnosis not present

## 2017-02-25 LAB — CBC WITH DIFFERENTIAL/PLATELET
Basophils Absolute: 0.1 10*3/uL (ref 0–0.1)
Basophils Relative: 3 %
EOS PCT: 7 %
Eosinophils Absolute: 0.2 10*3/uL (ref 0–0.7)
HEMATOCRIT: 33.5 % — AB (ref 40.0–52.0)
Hemoglobin: 11.6 g/dL — ABNORMAL LOW (ref 13.0–18.0)
LYMPHS PCT: 50 %
Lymphs Abs: 1.2 10*3/uL (ref 1.0–3.6)
MCH: 28.7 pg (ref 26.0–34.0)
MCHC: 34.7 g/dL (ref 32.0–36.0)
MCV: 82.7 fL (ref 80.0–100.0)
MONO ABS: 0.4 10*3/uL (ref 0.2–1.0)
Monocytes Relative: 18 %
NEUTROS ABS: 0.5 10*3/uL — AB (ref 1.4–6.5)
Neutrophils Relative %: 22 %
PLATELETS: 175 10*3/uL (ref 150–440)
RBC: 4.05 MIL/uL — AB (ref 4.40–5.90)
RDW: 15.9 % — ABNORMAL HIGH (ref 11.5–14.5)
WBC: 2.3 10*3/uL — AB (ref 3.8–10.6)

## 2017-02-25 MED ORDER — TBO-FILGRASTIM 480 MCG/0.8ML ~~LOC~~ SOSY
480.0000 ug | PREFILLED_SYRINGE | Freq: Once | SUBCUTANEOUS | Status: AC
  Administered 2017-02-25: 480 ug via SUBCUTANEOUS
  Filled 2017-02-25: qty 0.8

## 2017-03-04 ENCOUNTER — Inpatient Hospital Stay: Payer: Medicare HMO

## 2017-03-04 DIAGNOSIS — D709 Neutropenia, unspecified: Secondary | ICD-10-CM | POA: Diagnosis not present

## 2017-03-04 DIAGNOSIS — D708 Other neutropenia: Secondary | ICD-10-CM

## 2017-03-04 MED ORDER — TBO-FILGRASTIM 480 MCG/0.8ML ~~LOC~~ SOSY
480.0000 ug | PREFILLED_SYRINGE | Freq: Once | SUBCUTANEOUS | Status: AC
  Administered 2017-03-04: 480 ug via SUBCUTANEOUS
  Filled 2017-03-04: qty 0.8

## 2017-03-11 ENCOUNTER — Inpatient Hospital Stay: Payer: Medicare HMO | Attending: Internal Medicine

## 2017-03-11 DIAGNOSIS — Z7982 Long term (current) use of aspirin: Secondary | ICD-10-CM | POA: Insufficient documentation

## 2017-03-11 DIAGNOSIS — E1122 Type 2 diabetes mellitus with diabetic chronic kidney disease: Secondary | ICD-10-CM | POA: Insufficient documentation

## 2017-03-11 DIAGNOSIS — D509 Iron deficiency anemia, unspecified: Secondary | ICD-10-CM | POA: Diagnosis not present

## 2017-03-11 DIAGNOSIS — D709 Neutropenia, unspecified: Secondary | ICD-10-CM | POA: Insufficient documentation

## 2017-03-11 DIAGNOSIS — D708 Other neutropenia: Secondary | ICD-10-CM

## 2017-03-11 DIAGNOSIS — Z8572 Personal history of non-Hodgkin lymphomas: Secondary | ICD-10-CM | POA: Diagnosis not present

## 2017-03-11 DIAGNOSIS — G4730 Sleep apnea, unspecified: Secondary | ICD-10-CM | POA: Insufficient documentation

## 2017-03-11 DIAGNOSIS — Z803 Family history of malignant neoplasm of breast: Secondary | ICD-10-CM | POA: Insufficient documentation

## 2017-03-11 DIAGNOSIS — D631 Anemia in chronic kidney disease: Secondary | ICD-10-CM | POA: Insufficient documentation

## 2017-03-11 DIAGNOSIS — Z7984 Long term (current) use of oral hypoglycemic drugs: Secondary | ICD-10-CM | POA: Diagnosis not present

## 2017-03-11 DIAGNOSIS — I129 Hypertensive chronic kidney disease with stage 1 through stage 4 chronic kidney disease, or unspecified chronic kidney disease: Secondary | ICD-10-CM | POA: Insufficient documentation

## 2017-03-11 DIAGNOSIS — I2510 Atherosclerotic heart disease of native coronary artery without angina pectoris: Secondary | ICD-10-CM | POA: Diagnosis not present

## 2017-03-11 DIAGNOSIS — R5383 Other fatigue: Secondary | ICD-10-CM | POA: Diagnosis not present

## 2017-03-11 DIAGNOSIS — Z79899 Other long term (current) drug therapy: Secondary | ICD-10-CM | POA: Insufficient documentation

## 2017-03-11 MED ORDER — TBO-FILGRASTIM 480 MCG/0.8ML ~~LOC~~ SOSY
480.0000 ug | PREFILLED_SYRINGE | Freq: Once | SUBCUTANEOUS | Status: AC
  Administered 2017-03-11: 480 ug via SUBCUTANEOUS
  Filled 2017-03-11: qty 0.8

## 2017-03-18 ENCOUNTER — Inpatient Hospital Stay: Payer: Medicare HMO

## 2017-03-18 DIAGNOSIS — D709 Neutropenia, unspecified: Secondary | ICD-10-CM | POA: Diagnosis not present

## 2017-03-18 DIAGNOSIS — D708 Other neutropenia: Secondary | ICD-10-CM

## 2017-03-18 MED ORDER — TBO-FILGRASTIM 480 MCG/0.8ML ~~LOC~~ SOSY
480.0000 ug | PREFILLED_SYRINGE | Freq: Once | SUBCUTANEOUS | Status: AC
  Administered 2017-03-18: 480 ug via SUBCUTANEOUS
  Filled 2017-03-18: qty 0.8

## 2017-03-25 ENCOUNTER — Inpatient Hospital Stay: Payer: Medicare HMO

## 2017-03-25 ENCOUNTER — Telehealth: Payer: Self-pay | Admitting: *Deleted

## 2017-03-25 DIAGNOSIS — D708 Other neutropenia: Secondary | ICD-10-CM

## 2017-03-25 DIAGNOSIS — D709 Neutropenia, unspecified: Secondary | ICD-10-CM | POA: Diagnosis not present

## 2017-03-25 LAB — CBC WITH DIFFERENTIAL/PLATELET
BASOS ABS: 0.1 10*3/uL (ref 0–0.1)
Basophils Relative: 4 %
EOS ABS: 0.1 10*3/uL (ref 0–0.7)
EOS PCT: 6 %
HCT: 34.5 % — ABNORMAL LOW (ref 40.0–52.0)
Hemoglobin: 12 g/dL — ABNORMAL LOW (ref 13.0–18.0)
Lymphocytes Relative: 52 %
Lymphs Abs: 1 10*3/uL (ref 1.0–3.6)
MCH: 28.5 pg (ref 26.0–34.0)
MCHC: 34.9 g/dL (ref 32.0–36.0)
MCV: 81.5 fL (ref 80.0–100.0)
MONO ABS: 0.3 10*3/uL (ref 0.2–1.0)
Monocytes Relative: 17 %
Neutro Abs: 0.4 10*3/uL — ABNORMAL LOW (ref 1.4–6.5)
Neutrophils Relative %: 21 %
PLATELETS: 209 10*3/uL (ref 150–440)
RBC: 4.23 MIL/uL — ABNORMAL LOW (ref 4.40–5.90)
RDW: 15.7 % — AB (ref 11.5–14.5)
WBC: 2 10*3/uL — ABNORMAL LOW (ref 3.8–10.6)

## 2017-03-25 LAB — BASIC METABOLIC PANEL
ANION GAP: 4 — AB (ref 5–15)
BUN: 25 mg/dL — AB (ref 6–20)
CALCIUM: 9.2 mg/dL (ref 8.9–10.3)
CO2: 26 mmol/L (ref 22–32)
Chloride: 104 mmol/L (ref 101–111)
Creatinine, Ser: 1.46 mg/dL — ABNORMAL HIGH (ref 0.61–1.24)
GFR calc Af Amer: 53 mL/min — ABNORMAL LOW (ref >60)
GFR calc non Af Amer: 46 mL/min — ABNORMAL LOW (ref >60)
GLUCOSE: 145 mg/dL — AB (ref 65–99)
Potassium: 3.9 mmol/L (ref 3.5–5.1)
SODIUM: 134 mmol/L — AB (ref 135–145)

## 2017-03-25 MED ORDER — TBO-FILGRASTIM 480 MCG/0.8ML ~~LOC~~ SOSY
480.0000 ug | PREFILLED_SYRINGE | Freq: Once | SUBCUTANEOUS | Status: AC
  Administered 2017-03-25: 480 ug via SUBCUTANEOUS
  Filled 2017-03-25: qty 0.8

## 2017-03-25 NOTE — Telephone Encounter (Signed)
Patient here for scheduled granix. ANC 0.4 today- critical value called by Selena Batten in [facility] lab at 1016 am. Read back process performed with lab tech.  Dr. Donneta Romberg informed 10.31 am. Read back process performed with md.  Proceed with granix per order.

## 2017-03-26 ENCOUNTER — Other Ambulatory Visit: Payer: Self-pay | Admitting: *Deleted

## 2017-03-26 DIAGNOSIS — D708 Other neutropenia: Secondary | ICD-10-CM

## 2017-04-01 ENCOUNTER — Inpatient Hospital Stay: Payer: Medicare HMO

## 2017-04-01 ENCOUNTER — Inpatient Hospital Stay (HOSPITAL_BASED_OUTPATIENT_CLINIC_OR_DEPARTMENT_OTHER): Payer: Medicare HMO | Admitting: Internal Medicine

## 2017-04-01 VITALS — BP 167/70 | HR 66 | Temp 98.5°F | Resp 16 | Wt 212.0 lb

## 2017-04-01 DIAGNOSIS — Z79899 Other long term (current) drug therapy: Secondary | ICD-10-CM | POA: Diagnosis not present

## 2017-04-01 DIAGNOSIS — R5383 Other fatigue: Secondary | ICD-10-CM

## 2017-04-01 DIAGNOSIS — D708 Other neutropenia: Secondary | ICD-10-CM

## 2017-04-01 DIAGNOSIS — D509 Iron deficiency anemia, unspecified: Secondary | ICD-10-CM

## 2017-04-01 DIAGNOSIS — D631 Anemia in chronic kidney disease: Secondary | ICD-10-CM | POA: Diagnosis not present

## 2017-04-01 DIAGNOSIS — Z8572 Personal history of non-Hodgkin lymphomas: Secondary | ICD-10-CM

## 2017-04-01 DIAGNOSIS — D709 Neutropenia, unspecified: Secondary | ICD-10-CM

## 2017-04-01 DIAGNOSIS — E1122 Type 2 diabetes mellitus with diabetic chronic kidney disease: Secondary | ICD-10-CM

## 2017-04-01 DIAGNOSIS — Z7982 Long term (current) use of aspirin: Secondary | ICD-10-CM

## 2017-04-01 DIAGNOSIS — I129 Hypertensive chronic kidney disease with stage 1 through stage 4 chronic kidney disease, or unspecified chronic kidney disease: Secondary | ICD-10-CM

## 2017-04-01 DIAGNOSIS — I2510 Atherosclerotic heart disease of native coronary artery without angina pectoris: Secondary | ICD-10-CM | POA: Diagnosis not present

## 2017-04-01 DIAGNOSIS — G4730 Sleep apnea, unspecified: Secondary | ICD-10-CM

## 2017-04-01 DIAGNOSIS — Z803 Family history of malignant neoplasm of breast: Secondary | ICD-10-CM

## 2017-04-01 DIAGNOSIS — Z7984 Long term (current) use of oral hypoglycemic drugs: Secondary | ICD-10-CM

## 2017-04-01 LAB — CBC WITH DIFFERENTIAL/PLATELET
BASOS ABS: 0.1 10*3/uL (ref 0–0.1)
Basophils Relative: 3 %
EOS PCT: 7 %
Eosinophils Absolute: 0.1 10*3/uL (ref 0–0.7)
HEMATOCRIT: 34 % — AB (ref 40.0–52.0)
Hemoglobin: 12 g/dL — ABNORMAL LOW (ref 13.0–18.0)
LYMPHS PCT: 44 %
Lymphs Abs: 1 10*3/uL (ref 1.0–3.6)
MCH: 28.9 pg (ref 26.0–34.0)
MCHC: 35.3 g/dL (ref 32.0–36.0)
MCV: 82 fL (ref 80.0–100.0)
MONO ABS: 0.4 10*3/uL (ref 0.2–1.0)
MONOS PCT: 18 %
NEUTROS ABS: 0.6 10*3/uL — AB (ref 1.4–6.5)
Neutrophils Relative %: 28 %
PLATELETS: 171 10*3/uL (ref 150–440)
RBC: 4.15 MIL/uL — ABNORMAL LOW (ref 4.40–5.90)
RDW: 15.6 % — AB (ref 11.5–14.5)
WBC: 2.1 10*3/uL — ABNORMAL LOW (ref 3.8–10.6)

## 2017-04-01 MED ORDER — TBO-FILGRASTIM 480 MCG/0.8ML ~~LOC~~ SOSY
480.0000 ug | PREFILLED_SYRINGE | Freq: Once | SUBCUTANEOUS | Status: AC
  Administered 2017-04-01: 480 ug via SUBCUTANEOUS
  Filled 2017-04-01: qty 0.8

## 2017-04-01 NOTE — Progress Notes (Signed)
Patient here today for follow up.  Patient states no new concerns today  

## 2017-04-01 NOTE — Progress Notes (Signed)
[place]  [phone]) [phone] [phone]) [phone]     ID: Brandon Wilkerson OB: 1941/08/28  MR#: [id number]  CSN#:[id number]  Patient Care Team: Jodi Marble, MD as PCP - General (Internal Medicine)  CHIEF COMPLAINT/DIAGNOSIS:  Leukopenia/Neutropenia, Mild Anemia - Mild, asymptomatic, of unclear etiology. Workup so far unremarkable as detailed below.  Bone marrow biopsy 05/10/13 - no diagnostic morphologic evidence of B-cell lymphoproliferative disorder or other hematopoietic neoplasia identified.  Normocellular to slightly hypercellular marrow for age 53-50% with maturing trilineage hematopoiesis, storage iron present, slight patchy increase in reticulin  Flow study negative for any B-cell monoclonality, nonspecific myeloid and normocytic findings with no increase in blasts,relatively increased eosinophils 7% is nonspecific,relatively increased normal B-cell precursors (hematogones)and.  Normal male karyotype, 46XY.  Labs done on 11/02/12 - Hb 13.3, platelets 172K, retic 0.036, WBC 3400 with 38% neutrophils, 35% lymphocytes, 10% variant lymphocytes, 12% monocytes. Iron study, B12, folate, ANA, serum immunoelectrophoresis (SIEP), PT, PTT, LDH, haptoglobin, Coombs test, HBsAg, HCV antibody, and HIV antibody all unremarkable.   05/03/13 - Peripheral blood Flow Cytometry. A very small (0.3% of leukocytes) B cell clone is detected. Previous phenotyping results showed similar findings. The clone is very small and is not diagnostic of any specific type of lymphoma. If the patient has a previous history of lymphoma, the finding is consistent with persistent involvement by lymphoma. 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  07/23/15 -  Bone Marrow Biopsy Report  -  nonspecific marrow findings with no diagnostic morphologic or immunophenotypic evidence of hematopoietic neoplasia. Mildly hypercellular marrow for age (40-50%) with myeloid hypoplasia,  increased eosinophils, erythroid hyperplasia with mild nonspecific dyserythropoiesis and mild megakaryocytic atypia. Diffuse mild to focally moderate increase in reticulin. Storage iron present. Flow cytometry reports relatively decreased neutrophilic cells with no immunophenotypic abnormalities or increase in blasts, relatively increased eosinophils (17%) nonspecific. Cytogenetics normal 46XY. SNP microarray result is Normal Male. The treatment with short course of steroids was attempted in November 2016, however there was no response. Treatment with G-CSF was initiated in early December 2016  HISTORY OF PRESENT ILLNESS:   76 year old male patient with a history of neutropenia of unclear etiology/ currently on granix weekly is here for follow-up.  In the interim patient has not had any hospitalizations. Denies any need for antibiotics. No infections No weight loss. No night sweats. patient complains of mild-to-moderate fatigue.   REVIEW  OF SYSTEMS:   ROS: As in HPI above. In addition, no new headaches or focal weakness.  No sore throat, cough, shortness of breath, sputum, hemoptysis or chest pain. No abdominal pain, constipation, diarrhea, dysuria or hematuria.   PAST MEDICAL HISTORY: Reviewed.         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 As above.  FAMILY HISTORY: Reviewed. Remarkable for diabetes, heart disease, hypertension, breast cancer.   SOCIAL HISTORY: Reviewed. Nonsmoker.  Denies alcohol or recreational drug usage.    Allergies  Allergen Reactions  . Pollen Extract Other (See Comments)    Runny nose    Current Outpatient Prescriptions  Medication Sig Dispense Refill  . amLODipine (NORVASC) 10 MG tablet Take 10 mg by mouth daily.    Marland Kitchen aspirin 81 MG tablet Take 81  mg by mouth daily.    Marland Kitchen atorvastatin (LIPITOR) 80 MG tablet Take  80 mg by mouth daily.    . carvedilol (COREG) 6.25 MG tablet Take 6.25 mg by mouth 2 (two) times daily with a meal.    . cloNIDine (CATAPRES) 0.3 MG tablet Take 0.3 mg by mouth 2 (two) times daily.    . clopidogrel (PLAVIX) 75 MG tablet Take 75 mg by mouth daily.    . Dulaglutide (TRULICITY [place]) Inject 1.5 Units into the skin daily.     . fluticasone (FLONASE) 50 MCG/ACT nasal spray Place 1 spray into both nostrils daily.    Marland Kitchen gabapentin (NEURONTIN) 300 MG capsule Take 1 capsule (300 mg total) by mouth 3 (three) times daily. 90 capsule 0  . glipiZIDE (GLUCOTROL) 5 MG tablet Take 2.5 mg by mouth 2 (two) times daily.    . isosorbide mononitrate (IMDUR) 60 MG 24 hr tablet Take 60 mg by mouth daily.    . nitroGLYCERIN (NITROSTAT) 0.4 MG SL tablet Place 0.4 mg under the tongue every 5 (five) minutes as needed for chest pain.    . Vitamin D, Ergocalciferol, (DRISDOL) 50000 units CAPS capsule Take 50,000 Units by mouth every 30 (thirty) days.  3  . ONE TOUCH ULTRA TEST test strip use as directed;  twice a day OR MORE if needed  1   No current facility-administered medications for this visit.     PHYSICAL EXAM: Vitals:   04/01/17 1036  BP: (!) 167/70  Pulse: 66  Resp: 16  Temp: 98.5 F (36.9 C)     Body mass index is 28.44 kg/m.     BP (!) 167/70 (BP Location: Right Arm, Patient Position: Sitting)   Pulse 66   Temp 98.5 F (36.9 C) (Tympanic)   Resp 16   Wt 212 lb (96.2 kg)   BMI 28.44 kg/m   General Appearance:    Alert, cooperative, no distress, appears stated age;  accompanied by his wife   Head:    Normocephalic, without obvious abnormality, atraumatic  Eyes:    PERRL, conjunctiva/corneas clear, EOM's intact, fundi    benign, both eyes       Ears:    Normal TM's and external ear canals, both ears  Nose:   Nares normal, septum midline, mucosa normal, no drainage   or sinus tenderness  Throat:   Lips, mucosa, and  tongue normal; teeth and gums normal  Neck:   Supple, symmetrical, trachea midline, no adenopathy;       thyroid:  No enlargement/tenderness/nodules; carotid   bruit on L  Back:     Symmetric, no curvature, ROM normal, no CVA tenderness  Lungs:     Clear to auscultation bilaterally, respirations unlabored  Chest wall:    No tenderness or deformity  Heart:    Regular rate and rhythm, S1 and S2 normal, systolic ejection murmur 3 out of 6 on aorta   Abdomen:     Soft, non-tender, bowel sounds active all four quadrants,    no masses, no organomegaly  Extremities:   Extremities normal, atraumatic, no cyanosis or edema  Pulses:   2+ and symmetric all extremities  Skin:   Skin color, texture, turgor normal, no rashes or lesions  Lymph nodes:   Cervical, supraclavicular, and axillary nodes normal  Neurologic:   CNII-XII intact. Normal strength, sensation and reflexes      throughout     LAB RESULTS: Recent Results (from the past 2160 hour(s))  Basic metabolic panel     Status: Abnormal   Collection Time: 01/28/17 10:14 AM  Result Value Ref Range   Sodium 134 (L) 135 - 145 mmol/L   Potassium 4.0 3.5 - 5.1 mmol/L   Chloride 103 101 - 111 mmol/L   CO2 23 22 - 32 mmol/L   Glucose, Bld 229 (H) 65 - 99 mg/dL   BUN 29 (H) 6 - 20 mg/dL   Creatinine, Ser 1.65 (H) 0.61 - 1.24 mg/dL   Calcium 8.6 (L) 8.9 - 10.3 mg/dL   GFR calc non Af Amer 40 (L) >60 mL/min   GFR calc Af Amer 46 (L) >60 mL/min    Comment: (NOTE) The eGFR has been calculated using the CKD EPI equation. This calculation has not been validated in all clinical situations. eGFR's persistently <60 mL/min signify possible Chronic Kidney Disease.    Anion gap 8 5 - 15  Ferritin     Status: None   Collection Time: 01/28/17 10:14 AM  Result Value Ref Range   Ferritin 78 24 - 336 ng/mL  Iron and TIBC     Status: Abnormal   Collection Time: 01/28/17 10:14 AM  Result Value Ref Range   Iron 19 (L) 45 - 182 ug/dL   TIBC 333 250 - 450  ug/dL   Saturation Ratios 6 (L) 17.9 - 39.5 %   UIBC 314 ug/dL  CBC with Differential/Platelet     Status: Abnormal   Collection Time: 01/28/17 10:14 AM  Result Value Ref Range   WBC 2.8 (L) 3.8 - 10.6 K/uL   RBC 4.14 (L) 4.40 - 5.90 MIL/uL   Hemoglobin 11.9 (L) 13.0 - 18.0 g/dL   HCT 34.6 (L) 40.0 - 52.0 %   MCV 83.7 80.0 - 100.0 fL   MCH 28.7 26.0 - 34.0 pg   MCHC 34.2 32.0 - 36.0 g/dL   RDW 15.8 (H) 11.5 - 14.5 %   Platelets 170 150 - 440 K/uL   Neutrophils Relative % 47 %   Neutro Abs 1.3 (L) 1.4 - 6.5 K/uL   Lymphocytes Relative 33 %   Lymphs Abs 0.9 (L) 1.0 - 3.6 K/uL   Monocytes Relative 14 %   Monocytes Absolute 0.4 0.2 - 1.0 K/uL   Eosinophils Relative 4 %   Eosinophils Absolute 0.1 0 - 0.7 K/uL   Basophils Relative 2 %   Basophils Absolute 0.0 0 - 0.1 K/uL  CBC with Differential/Platelet     Status: Abnormal   Collection Time: 02/25/17 10:20 AM  Result Value Ref Range   WBC 2.3 (L) 3.8 - 10.6 K/uL   RBC 4.05 (L) 4.40 - 5.90 MIL/uL   Hemoglobin 11.6 (L) 13.0 - 18.0 g/dL   HCT 33.5 (L) 40.0 - 52.0 %   MCV 82.7 80.0 - 100.0 fL   MCH 28.7 26.0 - 34.0 pg   MCHC 34.7 32.0 - 36.0 g/dL   RDW 15.9 (H) 11.5 - 14.5 %   Platelets 175 150 - 440 K/uL   Neutrophils Relative % 22 %   Neutro Abs 0.5 (L) 1.4 - 6.5 K/uL   Lymphocytes Relative 50 %   Lymphs Abs 1.2 1.0 - 3.6 K/uL   Monocytes Relative 18 %   Monocytes Absolute 0.4 0.2 - 1.0 K/uL   Eosinophils Relative 7 %   Eosinophils Absolute 0.2 0 - 0.7 K/uL   Basophils Relative 3 %   Basophils Absolute 0.1 0 - 0.1 K/uL  CBC with Differential/Platelet     Status: Abnormal   Collection Time: 03/25/17 10:00 AM  Result Value Ref Range   WBC  2.0 (L) 3.8 - 10.6 K/uL   RBC 4.23 (L) 4.40 - 5.90 MIL/uL   Hemoglobin 12.0 (L) 13.0 - 18.0 g/dL   HCT 34.5 (L) 40.0 - 52.0 %   MCV 81.5 80.0 - 100.0 fL   MCH 28.5 26.0 - 34.0 pg   MCHC 34.9 32.0 - 36.0 g/dL   RDW 15.7 (H) 11.5 - 14.5 %   Platelets 209 150 - 440 K/uL   Neutrophils  Relative % 21 %   Neutro Abs 0.4 (L) 1.4 - 6.5 K/uL    Comment: RESULT REPEATED AND VERIFIED CRITICAL RESULT CALLED TO, READ BACK BY AND VERIFIED WITH: HEATHER JONES AT 1016 03/25/2017 KMR    Lymphocytes Relative 52 %   Lymphs Abs 1.0 1.0 - 3.6 K/uL   Monocytes Relative 17 %   Monocytes Absolute 0.3 0.2 - 1.0 K/uL   Eosinophils Relative 6 %   Eosinophils Absolute 0.1 0 - 0.7 K/uL   Basophils Relative 4 %   Basophils Absolute 0.1 0 - 0.1 K/uL  Basic metabolic panel     Status: Abnormal   Collection Time: 03/25/17 10:00 AM  Result Value Ref Range   Sodium 134 (L) 135 - 145 mmol/L   Potassium 3.9 3.5 - 5.1 mmol/L   Chloride 104 101 - 111 mmol/L   CO2 26 22 - 32 mmol/L   Glucose, Bld 145 (H) 65 - 99 mg/dL   BUN 25 (H) 6 - 20 mg/dL   Creatinine, Ser 1.46 (H) 0.61 - 1.24 mg/dL   Calcium 9.2 8.9 - 10.3 mg/dL   GFR calc non Af Amer 46 (L) >60 mL/min   GFR calc Af Amer 53 (L) >60 mL/min    Comment: (NOTE) The eGFR has been calculated using the CKD EPI equation. This calculation has not been validated in all clinical situations. eGFR's persistently <60 mL/min signify possible Chronic Kidney Disease.    Anion gap 4 (L) 5 - 15  CBC with Differential     Status: Abnormal   Collection Time: 04/01/17 10:05 AM  Result Value Ref Range   WBC 2.1 (L) 3.8 - 10.6 K/uL   RBC 4.15 (L) 4.40 - 5.90 MIL/uL   Hemoglobin 12.0 (L) 13.0 - 18.0 g/dL   HCT 34.0 (L) 40.0 - 52.0 %   MCV 82.0 80.0 - 100.0 fL   MCH 28.9 26.0 - 34.0 pg   MCHC 35.3 32.0 - 36.0 g/dL   RDW 15.6 (H) 11.5 - 14.5 %   Platelets 171 150 - 440 K/uL   Neutrophils Relative % 28 %   Neutro Abs 0.6 (L) 1.4 - 6.5 K/uL   Lymphocytes Relative 44 %   Lymphs Abs 1.0 1.0 - 3.6 K/uL   Monocytes Relative 18 %   Monocytes Absolute 0.4 0.2 - 1.0 K/uL   Eosinophils Relative 7 %   Eosinophils Absolute 0.1 0 - 0.7 K/uL   Basophils Relative 3 %   Basophils Absolute 0.1 0 - 0.1 K/uL     STUDIES: No results found. 07/23/15 -  Bone Marrow  Biopsy Report  -  nonspecific marrow findings with no diagnostic morphologic or immunophenotypic evidence of hematopoietic neoplasia. Mildly hypercellular marrow for age (40-50%) with myeloid hypoplasia, increased eosinophils, erythroid hyperplasia with mild nonspecific dyserythropoiesis and mild megakaryocytic atypia. Diffuse mild to focally moderate increase in reticulin. Storage iron present. Flow cytometry reports relatively decreased neutrophilic cells with no immunophenotypic abnormalities or increase in blasts, relatively increased eosinophils (17%) nonspecific. Cytogenetics normal 46XY. SNP microarray result  is Normal Male.    ASSESSMENT / PLAN:   Other neutropenia ([place]) 1. Leukopenia/Neutropenia  of unclear origin-as previously stated, extensive workup including multiple bone marrow biopsies failed to reveal clear etiology of persistent neutropenia.  Currently tolerating well.   # Continue Granix every week/every 4 weeks- CBC- today ANC 800- proceed with Granix.   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 Stable.  # Fatigue- ? b12 def; recheck b12. okay with sublingual b12 at home.   # CKD- stable/ Dr.Kolluru.   Follow-up with me in 3 months- lab monthly/weeklyGranix. Above plan of care was discussed with the patient/ wife- in detail. They  agree.   Cammie Sickle, MD   04/01/2017 3:19 PM

## 2017-04-01 NOTE — Assessment & Plan Note (Addendum)
1. Leukopenia/Neutropenia  of unclear origin-as previously stated, extensive workup including multiple bone marrow biopsies failed to reveal clear etiology of persistent neutropenia.  Currently tolerating well.   # Continue Granix every week/every 4 weeks- CBC- today ANC 800- proceed with Granix.   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 Stable.  # Fatigue- ? b12 def; recheck b12. okay with sublingual b12 at home.   # CKD- stable/ Dr.Kolluru.   Follow-up with me in 3 months- lab monthly/weeklyGranix. Above plan of care was discussed with the patient/ wife- in detail. They  agree.

## 2017-04-07 ENCOUNTER — Inpatient Hospital Stay: Payer: Medicare HMO | Attending: Internal Medicine

## 2017-04-07 DIAGNOSIS — D709 Neutropenia, unspecified: Secondary | ICD-10-CM | POA: Insufficient documentation

## 2017-04-07 DIAGNOSIS — D708 Other neutropenia: Secondary | ICD-10-CM

## 2017-04-07 MED ORDER — TBO-FILGRASTIM 480 MCG/0.8ML ~~LOC~~ SOSY
480.0000 ug | PREFILLED_SYRINGE | Freq: Once | SUBCUTANEOUS | Status: AC
  Administered 2017-04-07: 480 ug via SUBCUTANEOUS
  Filled 2017-04-07: qty 0.8

## 2017-04-08 ENCOUNTER — Ambulatory Visit: Payer: Medicare HMO

## 2017-04-14 ENCOUNTER — Inpatient Hospital Stay: Payer: Medicare HMO

## 2017-04-14 DIAGNOSIS — D708 Other neutropenia: Secondary | ICD-10-CM

## 2017-04-14 DIAGNOSIS — D709 Neutropenia, unspecified: Secondary | ICD-10-CM | POA: Diagnosis not present

## 2017-04-14 MED ORDER — TBO-FILGRASTIM 480 MCG/0.8ML ~~LOC~~ SOSY
480.0000 ug | PREFILLED_SYRINGE | Freq: Once | SUBCUTANEOUS | Status: AC
  Administered 2017-04-14: 480 ug via SUBCUTANEOUS
  Filled 2017-04-14: qty 0.8

## 2017-04-15 ENCOUNTER — Ambulatory Visit: Payer: Medicare HMO

## 2017-04-21 ENCOUNTER — Inpatient Hospital Stay: Payer: Medicare HMO

## 2017-04-21 DIAGNOSIS — D708 Other neutropenia: Secondary | ICD-10-CM

## 2017-04-21 DIAGNOSIS — D709 Neutropenia, unspecified: Secondary | ICD-10-CM | POA: Diagnosis not present

## 2017-04-21 MED ORDER — TBO-FILGRASTIM 480 MCG/0.8ML ~~LOC~~ SOSY
480.0000 ug | PREFILLED_SYRINGE | Freq: Once | SUBCUTANEOUS | Status: AC
  Administered 2017-04-21: 480 ug via SUBCUTANEOUS
  Filled 2017-04-21: qty 0.8

## 2017-04-22 ENCOUNTER — Ambulatory Visit: Payer: Medicare HMO

## 2017-04-28 ENCOUNTER — Inpatient Hospital Stay: Payer: Medicare HMO

## 2017-04-28 ENCOUNTER — Telehealth: Payer: Self-pay | Admitting: *Deleted

## 2017-04-28 DIAGNOSIS — D708 Other neutropenia: Secondary | ICD-10-CM

## 2017-04-28 DIAGNOSIS — D709 Neutropenia, unspecified: Secondary | ICD-10-CM | POA: Diagnosis not present

## 2017-04-28 LAB — CBC WITH DIFFERENTIAL/PLATELET
BASOS ABS: 0.1 10*3/uL (ref 0–0.1)
BASOS PCT: 3 %
EOS PCT: 13 %
Eosinophils Absolute: 0.2 10*3/uL (ref 0–0.7)
HCT: 33.4 % — ABNORMAL LOW (ref 40.0–52.0)
Hemoglobin: 11.8 g/dL — ABNORMAL LOW (ref 13.0–18.0)
Lymphocytes Relative: 50 %
Lymphs Abs: 0.9 10*3/uL — ABNORMAL LOW (ref 1.0–3.6)
MCH: 29.3 pg (ref 26.0–34.0)
MCHC: 35.3 g/dL (ref 32.0–36.0)
MCV: 83 fL (ref 80.0–100.0)
Monocytes Absolute: 0.3 10*3/uL (ref 0.2–1.0)
Monocytes Relative: 20 %
Neutro Abs: 0.2 10*3/uL — ABNORMAL LOW (ref 1.4–6.5)
Neutrophils Relative %: 14 %
PLATELETS: 161 10*3/uL (ref 150–440)
RBC: 4.02 MIL/uL — AB (ref 4.40–5.90)
RDW: 15.6 % — ABNORMAL HIGH (ref 11.5–14.5)
WBC: 1.8 10*3/uL — ABNORMAL LOW (ref 3.8–10.6)

## 2017-04-28 MED ORDER — TBO-FILGRASTIM 480 MCG/0.8ML ~~LOC~~ SOSY
480.0000 ug | PREFILLED_SYRINGE | Freq: Once | SUBCUTANEOUS | Status: AC
  Administered 2017-04-28: 480 ug via SUBCUTANEOUS
  Filled 2017-04-28: qty 0.8

## 2017-04-28 NOTE — Telephone Encounter (Signed)
Lonnie from the lab called.  Patient has a CRITICAL LAB value - ANC = 0.2.

## 2017-04-28 NOTE — Telephone Encounter (Signed)
Dr Donneta RombergBrahmanday notified, and gave approval to receive Granix injection today

## 2017-04-29 ENCOUNTER — Other Ambulatory Visit: Payer: Medicare HMO

## 2017-04-29 ENCOUNTER — Ambulatory Visit: Payer: Medicare HMO

## 2017-05-05 ENCOUNTER — Inpatient Hospital Stay: Payer: Medicare HMO

## 2017-05-05 DIAGNOSIS — D709 Neutropenia, unspecified: Secondary | ICD-10-CM | POA: Diagnosis not present

## 2017-05-05 DIAGNOSIS — D708 Other neutropenia: Secondary | ICD-10-CM

## 2017-05-05 MED ORDER — TBO-FILGRASTIM 480 MCG/0.8ML ~~LOC~~ SOSY
480.0000 ug | PREFILLED_SYRINGE | Freq: Once | SUBCUTANEOUS | Status: AC
  Administered 2017-05-05: 480 ug via SUBCUTANEOUS
  Filled 2017-05-05: qty 0.8

## 2017-05-06 ENCOUNTER — Ambulatory Visit: Payer: Medicare HMO

## 2017-05-12 ENCOUNTER — Inpatient Hospital Stay: Payer: Medicare HMO | Attending: Internal Medicine

## 2017-05-12 DIAGNOSIS — D709 Neutropenia, unspecified: Secondary | ICD-10-CM | POA: Insufficient documentation

## 2017-05-12 DIAGNOSIS — Z7689 Persons encountering health services in other specified circumstances: Secondary | ICD-10-CM | POA: Insufficient documentation

## 2017-05-12 DIAGNOSIS — D708 Other neutropenia: Secondary | ICD-10-CM

## 2017-05-12 MED ORDER — TBO-FILGRASTIM 480 MCG/0.8ML ~~LOC~~ SOSY
480.0000 ug | PREFILLED_SYRINGE | Freq: Once | SUBCUTANEOUS | Status: AC
  Administered 2017-05-12: 480 ug via SUBCUTANEOUS
  Filled 2017-05-12: qty 0.8

## 2017-05-13 ENCOUNTER — Ambulatory Visit: Payer: Medicare HMO

## 2017-05-19 ENCOUNTER — Inpatient Hospital Stay: Payer: Medicare HMO

## 2017-05-19 DIAGNOSIS — D709 Neutropenia, unspecified: Secondary | ICD-10-CM | POA: Diagnosis not present

## 2017-05-19 DIAGNOSIS — D708 Other neutropenia: Secondary | ICD-10-CM

## 2017-05-19 MED ORDER — TBO-FILGRASTIM 480 MCG/0.8ML ~~LOC~~ SOSY
480.0000 ug | PREFILLED_SYRINGE | Freq: Once | SUBCUTANEOUS | Status: AC
  Administered 2017-05-19: 480 ug via SUBCUTANEOUS
  Filled 2017-05-19: qty 0.8

## 2017-05-20 ENCOUNTER — Ambulatory Visit: Payer: Medicare HMO

## 2017-05-26 ENCOUNTER — Inpatient Hospital Stay: Payer: Medicare HMO

## 2017-05-26 DIAGNOSIS — D708 Other neutropenia: Secondary | ICD-10-CM

## 2017-05-26 DIAGNOSIS — D709 Neutropenia, unspecified: Secondary | ICD-10-CM | POA: Diagnosis not present

## 2017-05-26 LAB — CBC WITH DIFFERENTIAL/PLATELET
Basophils Absolute: 0.1 10*3/uL (ref 0–0.1)
Basophils Relative: 4 %
EOS PCT: 8 %
Eosinophils Absolute: 0.1 10*3/uL (ref 0–0.7)
HEMATOCRIT: 34.4 % — AB (ref 40.0–52.0)
HEMOGLOBIN: 12.2 g/dL — AB (ref 13.0–18.0)
LYMPHS ABS: 0.9 10*3/uL — AB (ref 1.0–3.6)
Lymphocytes Relative: 52 %
MCH: 29.3 pg (ref 26.0–34.0)
MCHC: 35.5 g/dL (ref 32.0–36.0)
MCV: 82.6 fL (ref 80.0–100.0)
Monocytes Absolute: 0.4 10*3/uL (ref 0.2–1.0)
Monocytes Relative: 20 %
NEUTROS PCT: 16 %
Neutro Abs: 0.3 10*3/uL — ABNORMAL LOW (ref 1.4–6.5)
Platelets: 186 10*3/uL (ref 150–440)
RBC: 4.16 MIL/uL — AB (ref 4.40–5.90)
RDW: 15 % — ABNORMAL HIGH (ref 11.5–14.5)
WBC: 1.8 10*3/uL — AB (ref 3.8–10.6)

## 2017-05-26 MED ORDER — TBO-FILGRASTIM 480 MCG/0.8ML ~~LOC~~ SOSY
480.0000 ug | PREFILLED_SYRINGE | Freq: Once | SUBCUTANEOUS | Status: AC
  Administered 2017-05-26: 480 ug via SUBCUTANEOUS
  Filled 2017-05-26: qty 0.8

## 2017-05-26 NOTE — Progress Notes (Signed)
Patient reports after last weeks Granix injection he started running a temp of 101.7 with some abdominal pain about 4 hours later Then around 9 pm he starting noticing abdominal swelling.  Informed MD on call, Dr. Orlie DakinFinnegan, and he recommends to continue with Granix injection today.  Advised patient to call the office with any symptoms after today's injection.

## 2017-05-27 ENCOUNTER — Ambulatory Visit: Payer: Medicare HMO

## 2017-05-27 ENCOUNTER — Other Ambulatory Visit: Payer: Medicare HMO

## 2017-06-02 ENCOUNTER — Inpatient Hospital Stay: Payer: Medicare HMO

## 2017-06-02 DIAGNOSIS — D708 Other neutropenia: Secondary | ICD-10-CM

## 2017-06-02 DIAGNOSIS — D709 Neutropenia, unspecified: Secondary | ICD-10-CM | POA: Diagnosis not present

## 2017-06-02 MED ORDER — TBO-FILGRASTIM 480 MCG/0.8ML ~~LOC~~ SOSY
480.0000 ug | PREFILLED_SYRINGE | Freq: Once | SUBCUTANEOUS | Status: AC
  Administered 2017-06-02: 480 ug via SUBCUTANEOUS
  Filled 2017-06-02: qty 0.8

## 2017-06-03 ENCOUNTER — Ambulatory Visit: Payer: Medicare HMO

## 2017-06-10 ENCOUNTER — Ambulatory Visit: Payer: Medicare HMO

## 2017-06-10 ENCOUNTER — Inpatient Hospital Stay: Payer: Medicare HMO | Attending: Internal Medicine

## 2017-06-10 DIAGNOSIS — E1122 Type 2 diabetes mellitus with diabetic chronic kidney disease: Secondary | ICD-10-CM | POA: Insufficient documentation

## 2017-06-10 DIAGNOSIS — I129 Hypertensive chronic kidney disease with stage 1 through stage 4 chronic kidney disease, or unspecified chronic kidney disease: Secondary | ICD-10-CM | POA: Insufficient documentation

## 2017-06-10 DIAGNOSIS — D509 Iron deficiency anemia, unspecified: Secondary | ICD-10-CM | POA: Insufficient documentation

## 2017-06-10 DIAGNOSIS — D631 Anemia in chronic kidney disease: Secondary | ICD-10-CM | POA: Insufficient documentation

## 2017-06-10 DIAGNOSIS — D709 Neutropenia, unspecified: Secondary | ICD-10-CM | POA: Insufficient documentation

## 2017-06-11 ENCOUNTER — Inpatient Hospital Stay: Payer: Medicare HMO

## 2017-06-11 DIAGNOSIS — D509 Iron deficiency anemia, unspecified: Secondary | ICD-10-CM | POA: Diagnosis not present

## 2017-06-11 DIAGNOSIS — D631 Anemia in chronic kidney disease: Secondary | ICD-10-CM | POA: Diagnosis not present

## 2017-06-11 DIAGNOSIS — E1122 Type 2 diabetes mellitus with diabetic chronic kidney disease: Secondary | ICD-10-CM | POA: Diagnosis not present

## 2017-06-11 DIAGNOSIS — I129 Hypertensive chronic kidney disease with stage 1 through stage 4 chronic kidney disease, or unspecified chronic kidney disease: Secondary | ICD-10-CM | POA: Diagnosis not present

## 2017-06-11 DIAGNOSIS — D708 Other neutropenia: Secondary | ICD-10-CM

## 2017-06-11 DIAGNOSIS — D709 Neutropenia, unspecified: Secondary | ICD-10-CM | POA: Diagnosis present

## 2017-06-11 MED ORDER — TBO-FILGRASTIM 480 MCG/0.8ML ~~LOC~~ SOSY
480.0000 ug | PREFILLED_SYRINGE | Freq: Once | SUBCUTANEOUS | Status: AC
  Administered 2017-06-11: 480 ug via SUBCUTANEOUS

## 2017-06-16 ENCOUNTER — Inpatient Hospital Stay: Payer: Medicare HMO

## 2017-06-16 ENCOUNTER — Telehealth: Payer: Self-pay | Admitting: *Deleted

## 2017-06-16 DIAGNOSIS — D709 Neutropenia, unspecified: Secondary | ICD-10-CM | POA: Diagnosis not present

## 2017-06-16 DIAGNOSIS — D708 Other neutropenia: Secondary | ICD-10-CM

## 2017-06-16 MED ORDER — TBO-FILGRASTIM 480 MCG/0.8ML ~~LOC~~ SOSY
480.0000 ug | PREFILLED_SYRINGE | Freq: Once | SUBCUTANEOUS | Status: AC
  Administered 2017-06-16: 480 ug via SUBCUTANEOUS

## 2017-06-16 NOTE — Telephone Encounter (Signed)
msg sent to sch. Team to add a lab encounter for 06/30/17.  per Brandon HartsHeather Wilkerson, CMA pt may not be able to come next week for his apts due to being out of town.

## 2017-06-17 ENCOUNTER — Ambulatory Visit: Payer: Medicare HMO

## 2017-06-23 ENCOUNTER — Inpatient Hospital Stay: Payer: Medicare HMO

## 2017-06-24 ENCOUNTER — Ambulatory Visit: Payer: Medicare HMO

## 2017-06-24 ENCOUNTER — Other Ambulatory Visit: Payer: Medicare HMO

## 2017-06-30 ENCOUNTER — Inpatient Hospital Stay: Payer: Medicare HMO

## 2017-06-30 DIAGNOSIS — D708 Other neutropenia: Secondary | ICD-10-CM

## 2017-06-30 DIAGNOSIS — D709 Neutropenia, unspecified: Secondary | ICD-10-CM | POA: Diagnosis not present

## 2017-06-30 LAB — CBC WITH DIFFERENTIAL/PLATELET
Basophils Absolute: 0.1 10*3/uL (ref 0–0.1)
Basophils Relative: 4 %
Eosinophils Absolute: 0.2 10*3/uL (ref 0–0.7)
Eosinophils Relative: 8 %
HEMATOCRIT: 34.1 % — AB (ref 40.0–52.0)
Hemoglobin: 11.9 g/dL — ABNORMAL LOW (ref 13.0–18.0)
LYMPHS PCT: 50 %
Lymphs Abs: 1.3 10*3/uL (ref 1.0–3.6)
MCH: 29.4 pg (ref 26.0–34.0)
MCHC: 34.9 g/dL (ref 32.0–36.0)
MCV: 84.1 fL (ref 80.0–100.0)
MONO ABS: 0.4 10*3/uL (ref 0.2–1.0)
MONOS PCT: 15 %
NEUTROS ABS: 0.6 10*3/uL — AB (ref 1.4–6.5)
Neutrophils Relative %: 23 %
Platelets: 202 10*3/uL (ref 150–440)
RBC: 4.05 MIL/uL — ABNORMAL LOW (ref 4.40–5.90)
RDW: 15 % — AB (ref 11.5–14.5)
WBC: 2.6 10*3/uL — ABNORMAL LOW (ref 3.8–10.6)

## 2017-06-30 MED ORDER — TBO-FILGRASTIM 480 MCG/0.8ML ~~LOC~~ SOSY
480.0000 ug | PREFILLED_SYRINGE | Freq: Once | SUBCUTANEOUS | Status: AC
  Administered 2017-06-30: 480 ug via SUBCUTANEOUS
  Filled 2017-06-30: qty 0.8

## 2017-07-01 ENCOUNTER — Ambulatory Visit: Payer: Medicare HMO

## 2017-07-07 ENCOUNTER — Inpatient Hospital Stay: Payer: Medicare HMO | Attending: Internal Medicine

## 2017-07-07 DIAGNOSIS — D509 Iron deficiency anemia, unspecified: Secondary | ICD-10-CM | POA: Insufficient documentation

## 2017-07-07 DIAGNOSIS — Z833 Family history of diabetes mellitus: Secondary | ICD-10-CM | POA: Diagnosis not present

## 2017-07-07 DIAGNOSIS — I2510 Atherosclerotic heart disease of native coronary artery without angina pectoris: Secondary | ICD-10-CM | POA: Insufficient documentation

## 2017-07-07 DIAGNOSIS — E1151 Type 2 diabetes mellitus with diabetic peripheral angiopathy without gangrene: Secondary | ICD-10-CM | POA: Diagnosis not present

## 2017-07-07 DIAGNOSIS — Z79899 Other long term (current) drug therapy: Secondary | ICD-10-CM | POA: Insufficient documentation

## 2017-07-07 DIAGNOSIS — Z8572 Personal history of non-Hodgkin lymphomas: Secondary | ICD-10-CM | POA: Insufficient documentation

## 2017-07-07 DIAGNOSIS — D709 Neutropenia, unspecified: Secondary | ICD-10-CM | POA: Diagnosis not present

## 2017-07-07 DIAGNOSIS — G4730 Sleep apnea, unspecified: Secondary | ICD-10-CM | POA: Insufficient documentation

## 2017-07-07 DIAGNOSIS — Z7982 Long term (current) use of aspirin: Secondary | ICD-10-CM | POA: Diagnosis not present

## 2017-07-07 DIAGNOSIS — N183 Chronic kidney disease, stage 3 (moderate): Secondary | ICD-10-CM | POA: Diagnosis not present

## 2017-07-07 DIAGNOSIS — D631 Anemia in chronic kidney disease: Secondary | ICD-10-CM | POA: Diagnosis not present

## 2017-07-07 DIAGNOSIS — R6889 Other general symptoms and signs: Secondary | ICD-10-CM | POA: Diagnosis not present

## 2017-07-07 DIAGNOSIS — D708 Other neutropenia: Secondary | ICD-10-CM

## 2017-07-07 DIAGNOSIS — E1122 Type 2 diabetes mellitus with diabetic chronic kidney disease: Secondary | ICD-10-CM | POA: Insufficient documentation

## 2017-07-07 DIAGNOSIS — R011 Cardiac murmur, unspecified: Secondary | ICD-10-CM | POA: Insufficient documentation

## 2017-07-07 DIAGNOSIS — Z7984 Long term (current) use of oral hypoglycemic drugs: Secondary | ICD-10-CM | POA: Insufficient documentation

## 2017-07-07 DIAGNOSIS — I129 Hypertensive chronic kidney disease with stage 1 through stage 4 chronic kidney disease, or unspecified chronic kidney disease: Secondary | ICD-10-CM | POA: Insufficient documentation

## 2017-07-07 MED ORDER — TBO-FILGRASTIM 480 MCG/0.8ML ~~LOC~~ SOSY
480.0000 ug | PREFILLED_SYRINGE | Freq: Once | SUBCUTANEOUS | Status: AC
  Administered 2017-07-07: 480 ug via SUBCUTANEOUS
  Filled 2017-07-07: qty 0.8

## 2017-07-08 ENCOUNTER — Ambulatory Visit: Payer: Medicare HMO

## 2017-07-14 ENCOUNTER — Inpatient Hospital Stay: Payer: Medicare HMO

## 2017-07-15 ENCOUNTER — Ambulatory Visit: Payer: Medicare HMO

## 2017-07-21 ENCOUNTER — Inpatient Hospital Stay: Payer: Medicare HMO

## 2017-07-21 ENCOUNTER — Other Ambulatory Visit: Payer: Self-pay | Admitting: *Deleted

## 2017-07-21 DIAGNOSIS — D708 Other neutropenia: Secondary | ICD-10-CM

## 2017-07-21 DIAGNOSIS — D709 Neutropenia, unspecified: Secondary | ICD-10-CM | POA: Diagnosis not present

## 2017-07-21 LAB — CBC WITH DIFFERENTIAL/PLATELET
Basophils Absolute: 0.1 10*3/uL (ref 0–0.1)
Basophils Relative: 4 %
EOS ABS: 0.2 10*3/uL (ref 0–0.7)
EOS PCT: 6 %
HCT: 32.7 % — ABNORMAL LOW (ref 40.0–52.0)
HEMOGLOBIN: 11.7 g/dL — AB (ref 13.0–18.0)
LYMPHS ABS: 1.3 10*3/uL (ref 1.0–3.6)
Lymphocytes Relative: 44 %
MCH: 29.4 pg (ref 26.0–34.0)
MCHC: 35.8 g/dL (ref 32.0–36.0)
MCV: 82.2 fL (ref 80.0–100.0)
MONOS PCT: 11 %
Monocytes Absolute: 0.3 10*3/uL (ref 0.2–1.0)
Neutro Abs: 1 10*3/uL — ABNORMAL LOW (ref 1.4–6.5)
Neutrophils Relative %: 35 %
PLATELETS: 271 10*3/uL (ref 150–440)
RBC: 3.98 MIL/uL — ABNORMAL LOW (ref 4.40–5.90)
RDW: 14 % (ref 11.5–14.5)
WBC: 3 10*3/uL — ABNORMAL LOW (ref 3.8–10.6)

## 2017-07-21 MED ORDER — TBO-FILGRASTIM 480 MCG/0.8ML ~~LOC~~ SOSY
480.0000 ug | PREFILLED_SYRINGE | Freq: Once | SUBCUTANEOUS | Status: AC
  Administered 2017-07-21: 480 ug via SUBCUTANEOUS
  Filled 2017-07-21: qty 0.8

## 2017-07-22 ENCOUNTER — Ambulatory Visit: Payer: Medicare HMO

## 2017-07-22 ENCOUNTER — Other Ambulatory Visit: Payer: Medicare HMO

## 2017-07-28 ENCOUNTER — Encounter: Payer: Self-pay | Admitting: *Deleted

## 2017-07-28 ENCOUNTER — Inpatient Hospital Stay: Payer: Medicare HMO

## 2017-07-28 DIAGNOSIS — D709 Neutropenia, unspecified: Secondary | ICD-10-CM | POA: Diagnosis not present

## 2017-07-28 DIAGNOSIS — D708 Other neutropenia: Secondary | ICD-10-CM

## 2017-07-28 MED ORDER — TBO-FILGRASTIM 480 MCG/0.8ML ~~LOC~~ SOSY
480.0000 ug | PREFILLED_SYRINGE | Freq: Once | SUBCUTANEOUS | Status: AC
  Administered 2017-07-28: 480 ug via SUBCUTANEOUS
  Filled 2017-07-28: qty 0.8

## 2017-07-29 ENCOUNTER — Ambulatory Visit: Payer: Medicare HMO

## 2017-08-03 ENCOUNTER — Other Ambulatory Visit: Payer: Self-pay | Admitting: Internal Medicine

## 2017-08-03 DIAGNOSIS — D708 Other neutropenia: Secondary | ICD-10-CM

## 2017-08-04 ENCOUNTER — Inpatient Hospital Stay: Payer: Medicare HMO

## 2017-08-04 ENCOUNTER — Inpatient Hospital Stay (HOSPITAL_BASED_OUTPATIENT_CLINIC_OR_DEPARTMENT_OTHER): Payer: Medicare HMO | Admitting: Internal Medicine

## 2017-08-04 VITALS — BP 117/62 | HR 62 | Temp 98.1°F | Resp 16 | Wt 207.4 lb

## 2017-08-04 DIAGNOSIS — Z7984 Long term (current) use of oral hypoglycemic drugs: Secondary | ICD-10-CM | POA: Diagnosis not present

## 2017-08-04 DIAGNOSIS — I2510 Atherosclerotic heart disease of native coronary artery without angina pectoris: Secondary | ICD-10-CM

## 2017-08-04 DIAGNOSIS — I129 Hypertensive chronic kidney disease with stage 1 through stage 4 chronic kidney disease, or unspecified chronic kidney disease: Secondary | ICD-10-CM

## 2017-08-04 DIAGNOSIS — R011 Cardiac murmur, unspecified: Secondary | ICD-10-CM | POA: Diagnosis not present

## 2017-08-04 DIAGNOSIS — E1151 Type 2 diabetes mellitus with diabetic peripheral angiopathy without gangrene: Secondary | ICD-10-CM

## 2017-08-04 DIAGNOSIS — N183 Chronic kidney disease, stage 3 (moderate): Secondary | ICD-10-CM | POA: Diagnosis not present

## 2017-08-04 DIAGNOSIS — G4730 Sleep apnea, unspecified: Secondary | ICD-10-CM | POA: Diagnosis not present

## 2017-08-04 DIAGNOSIS — D708 Other neutropenia: Secondary | ICD-10-CM

## 2017-08-04 DIAGNOSIS — Z8572 Personal history of non-Hodgkin lymphomas: Secondary | ICD-10-CM

## 2017-08-04 DIAGNOSIS — E1122 Type 2 diabetes mellitus with diabetic chronic kidney disease: Secondary | ICD-10-CM | POA: Diagnosis not present

## 2017-08-04 DIAGNOSIS — Z79899 Other long term (current) drug therapy: Secondary | ICD-10-CM

## 2017-08-04 DIAGNOSIS — E631 Imbalance of constituents of food intake: Secondary | ICD-10-CM

## 2017-08-04 DIAGNOSIS — R6889 Other general symptoms and signs: Secondary | ICD-10-CM

## 2017-08-04 DIAGNOSIS — D509 Iron deficiency anemia, unspecified: Secondary | ICD-10-CM

## 2017-08-04 DIAGNOSIS — Z7982 Long term (current) use of aspirin: Secondary | ICD-10-CM

## 2017-08-04 DIAGNOSIS — D709 Neutropenia, unspecified: Secondary | ICD-10-CM

## 2017-08-04 DIAGNOSIS — Z833 Family history of diabetes mellitus: Secondary | ICD-10-CM

## 2017-08-04 LAB — CBC WITH DIFFERENTIAL/PLATELET
Basophils Absolute: 0 10*3/uL (ref 0–0.1)
Basophils Relative: 2 %
Eosinophils Absolute: 0.1 10*3/uL (ref 0–0.7)
Eosinophils Relative: 6 %
HCT: 33.3 % — ABNORMAL LOW (ref 40.0–52.0)
HEMOGLOBIN: 11.6 g/dL — AB (ref 13.0–18.0)
LYMPHS ABS: 1.1 10*3/uL (ref 1.0–3.6)
LYMPHS PCT: 50 %
MCH: 29.4 pg (ref 26.0–34.0)
MCHC: 34.9 g/dL (ref 32.0–36.0)
MCV: 84.2 fL (ref 80.0–100.0)
Monocytes Absolute: 0.3 10*3/uL (ref 0.2–1.0)
Monocytes Relative: 15 %
NEUTROS PCT: 27 %
Neutro Abs: 0.6 10*3/uL — ABNORMAL LOW (ref 1.4–6.5)
PLATELETS: 169 10*3/uL (ref 150–440)
RBC: 3.95 MIL/uL — AB (ref 4.40–5.90)
RDW: 15.2 % — AB (ref 11.5–14.5)
WBC: 2.1 10*3/uL — AB (ref 3.8–10.6)

## 2017-08-04 LAB — BASIC METABOLIC PANEL
Anion gap: 8 (ref 5–15)
BUN: 22 mg/dL — AB (ref 6–20)
CHLORIDE: 101 mmol/L (ref 101–111)
CO2: 27 mmol/L (ref 22–32)
Calcium: 8.7 mg/dL — ABNORMAL LOW (ref 8.9–10.3)
Creatinine, Ser: 1.51 mg/dL — ABNORMAL HIGH (ref 0.61–1.24)
GFR calc non Af Amer: 44 mL/min — ABNORMAL LOW (ref >60)
GFR, EST AFRICAN AMERICAN: 51 mL/min — AB (ref >60)
Glucose, Bld: 200 mg/dL — ABNORMAL HIGH (ref 65–99)
POTASSIUM: 3.9 mmol/L (ref 3.5–5.1)
SODIUM: 136 mmol/L (ref 135–145)

## 2017-08-04 MED ORDER — TBO-FILGRASTIM 480 MCG/0.8ML ~~LOC~~ SOSY
480.0000 ug | PREFILLED_SYRINGE | Freq: Once | SUBCUTANEOUS | Status: AC
  Administered 2017-08-04: 480 ug via SUBCUTANEOUS
  Filled 2017-08-04: qty 0.8

## 2017-08-04 NOTE — Assessment & Plan Note (Addendum)
1. Leukopenia/Neutropenia  of unclear origin-as previously stated, extensive workup including multiple bone marrow biopsies failed to reveal clear etiology of persistent neutropenia.   # Patient currently on Granix every week; given the logistics/relative absence of symptoms- recommend switching to every other week; labs every 4 weeks. 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 Stable. On PO iron.   # Rigors/ cold sweat- ~ 6 hours- improved spontaneously; heart murmur- awaiting 2 d echo/ with Dr.Khan.   # Abdominal discomfort- currently resolved. Question diverticulitis versus others.   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   Follow-up with me in 3 months- lab monthly/every 2 weeks Granix [will change to one weekly if ]. Above plan of care was discussed with the patient/ wife- in detail. They  agree.

## 2017-08-04 NOTE — Progress Notes (Signed)
[place]  [phone] [phone]     ID: WING SCHOCH OB: May 27, 1941  MR#: [id number]  CSN#:[id number]  Patient Care Team: Jodi Marble, MD as PCP - General (Internal Medicine)  CHIEF COMPLAINT/DIAGNOSIS:  Leukopenia/Neutropenia, Mild Anemia - Mild, asymptomatic, of unclear etiology. Workup so far unremarkable as detailed below.  Bone marrow biopsy 05/10/13 - no diagnostic morphologic evidence of B-cell lymphoproliferative disorder or other hematopoietic neoplasia identified.  Normocellular to slightly hypercellular marrow for age 74-50% with maturing trilineage hematopoiesis, storage iron present, slight patchy increase in reticulin  Flow study negative for any B-cell monoclonality, nonspecific myeloid and normocytic findings with no increase in blasts,relatively increased eosinophils 7% is nonspecific,relatively increased normal B-cell precursors (hematogones)and.  Normal male karyotype, 46XY.  Labs done on 11/02/12 - Hb 13.3, platelets 172K, retic 0.036, WBC 3400 with 38% neutrophils, 35% lymphocytes, 10% variant lymphocytes, 12% monocytes. Iron study, B12, folate, ANA, serum immunoelectrophoresis (SIEP), PT, PTT, LDH, haptoglobin, Coombs test, HBsAg, HCV antibody, and HIV antibody all unremarkable.   05/03/13 - Peripheral blood Flow Cytometry. A very small (0.3% of leukocytes) B cell clone is detected. Previous phenotyping results showed similar findings. The clone is very small and is not diagnostic of any specific type of lymphoma. If the patient has a previous history of lymphoma, the finding is consistent with persistent involvement by lymphoma. 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  07/23/15 -  Bone Marrow Biopsy Report  -  nonspecific marrow findings with no diagnostic morphologic or immunophenotypic evidence of hematopoietic neoplasia. Mildly hypercellular marrow for age (40-50%) with myeloid hypoplasia,  increased eosinophils, erythroid hyperplasia with mild nonspecific dyserythropoiesis and mild megakaryocytic atypia. Diffuse mild to focally moderate increase in reticulin. Storage iron present. Flow cytometry reports relatively decreased neutrophilic cells with no immunophenotypic abnormalities or increase in blasts, relatively increased eosinophils (17%) nonspecific. Cytogenetics normal 46XY. SNP microarray result is Normal Male. The treatment with short course of steroids was attempted in November 2016, however there was no response. Treatment with G-CSF was initiated in early December 2016  HISTORY OF PRESENT ILLNESS:   76 year old male patient with a history of neutropenia of unclear etiology/ currently on granix weekly is here for follow-up.  Patient had episode of "cold sweats/rigors" that lasted approximately 6 hours while he was on a vacation/at the beach. Symptoms resolved by itself. Did not take NSAIDs. No antibiotics.  He also had episode of abdominal pain/ with intermittent constipation- currently resolved.  In the interim patient has not had any hospitalizations. Denies any need for antibiotics. No infections No weight loss. His chronic tingling and numbness in his left foot not any worse.  REVIEW  OF SYSTEMS:   ROS: As in HPI above. In addition, no new headaches or focal weakness.  No sore throat, cough, shortness of breath, sputum, hemoptysis or chest pain. No abdominal pain, constipation, diarrhea, dysuria or hematuria.   PAST MEDICAL HISTORY: Reviewed.         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 As above.  FAMILY HISTORY: Reviewed. Remarkable for diabetes, heart disease, hypertension, breast cancer.   SOCIAL HISTORY: Reviewed. Nonsmoker.  Denies alcohol or  recreational drug usage.    Allergies  Allergen Reactions  . Pollen Extract Other (See Comments)    Runny nose  Current Outpatient Prescriptions  Medication Sig Dispense Refill  . amLODipine (NORVASC) 10 MG tablet Take 10 mg by mouth daily.    Marland Kitchen aspirin 81 MG tablet Take 81 mg by mouth daily.    Marland Kitchen atorvastatin (LIPITOR) 80 MG tablet Take 80 mg by mouth daily.    . carvedilol (COREG) 6.25 MG tablet Take 6.25 mg by mouth 2 (two) times daily with a meal.    . cloNIDine (CATAPRES) 0.3 MG tablet Take 0.3 mg by mouth 2 (two) times daily.    . clopidogrel (PLAVIX) 75 MG tablet Take 75 mg by mouth daily.    . Dulaglutide (TRULICITY [place]) Inject 1.5 Units into the skin daily.     . ferrous sulfate 325 (65 FE) MG EC tablet Take 325 mg by mouth daily.    . fluticasone (FLONASE) 50 MCG/ACT nasal spray Place 1 spray into both nostrils daily.    Marland Kitchen gabapentin (NEURONTIN) 300 MG capsule Take 1 capsule (300 mg total) by mouth 3 (three) times daily. 90 capsule 0  . glipiZIDE (GLUCOTROL) 5 MG tablet Take 2.5 mg by mouth 2 (two) times daily.    . isosorbide mononitrate (IMDUR) 60 MG 24 hr tablet Take 60 mg by mouth daily.    . nitroGLYCERIN (NITROSTAT) 0.4 MG SL tablet Place 0.4 mg under the tongue every 5 (five) minutes as needed for chest pain.    . ONE TOUCH ULTRA TEST test strip use as directed;  twice a day OR MORE if needed  1  . Vitamin D, Ergocalciferol, (DRISDOL) 50000 units CAPS capsule Take 50,000 Units by mouth every 30 (thirty) days.  3   No current facility-administered medications for this visit.    Facility-Administered Medications Ordered in Other Visits  Medication Dose Route Frequency Provider Last Rate Last Dose  . Tbo-Filgrastim (GRANIX) injection 480 mcg  480 mcg Subcutaneous Once Charlaine Dalton R, MD        PHYSICAL EXAM: Vitals:   08/04/17 1348  BP: 117/62  Pulse: 62  Resp: 16  Temp: 98.1 F (36.7 C)     Body mass index is 27.82 kg/m.     BP 117/62 (BP Location:  Left Arm, Patient Position: Sitting)   Pulse 62   Temp 98.1 F (36.7 C) (Tympanic)   Resp 16   Wt 207 lb 6.4 oz (94.1 kg)   BMI 27.82 kg/m   General Appearance:    Alert, cooperative, no distress, appears stated age;  accompanied by his wife   Head:    Normocephalic, without obvious abnormality, atraumatic  Eyes:    PERRL, conjunctiva/corneas clear, EOM's intact, fundi    benign, both eyes       Ears:    Normal TM's and external ear canals, both ears  Nose:   Nares normal, septum midline, mucosa normal, no drainage   or sinus tenderness  Throat:   Lips, mucosa, and tongue normal; teeth and gums normal  Neck:   Supple, symmetrical, trachea midline, no adenopathy;       thyroid:  No enlargement/tenderness/nodules; carotid   bruit on L  Back:     Symmetric, no curvature, ROM normal, no CVA tenderness  Lungs:     Clear to auscultation bilaterally, respirations unlabored  Chest wall:    No tenderness or deformity  Heart:    Regular rate and rhythm, S1 and S2 normal, systolic ejection murmur 3 out of 6 on aorta   Abdomen:     Soft, non-tender, bowel sounds active all  four quadrants,    no masses, no organomegaly  Extremities:   Extremities normal, atraumatic, no cyanosis or edema  Pulses:   2+ and symmetric all extremities  Skin:   Skin color, texture, turgor normal, no rashes or lesions  Lymph nodes:   Cervical, supraclavicular, and axillary nodes normal  Neurologic:   CNII-XII intact. Normal strength, sensation and reflexes      throughout     LAB RESULTS: Recent Results (from the past 2160 hour(s))  CBC with Differential/Platelet     Status: Abnormal   Collection Time: 05/26/17 11:15 AM  Result Value Ref Range   WBC 1.8 (L) 3.8 - 10.6 K/uL   RBC 4.16 (L) 4.40 - 5.90 MIL/uL   Hemoglobin 12.2 (L) 13.0 - 18.0 g/dL   HCT 34.4 (L) 40.0 - 52.0 %   MCV 82.6 80.0 - 100.0 fL   MCH 29.3 26.0 - 34.0 pg   MCHC 35.5 32.0 - 36.0 g/dL   RDW 15.0 (H) 11.5 - 14.5 %   Platelets 186 150 -  440 K/uL   Neutrophils Relative % 16 %   Neutro Abs 0.3 (L) 1.4 - 6.5 K/uL    Comment: RESULT REPEATED AND VERIFIED CRITICAL RESULT CALLED TO, READ BACK BY AND VERIFIED WITH: [facility] [place] AT 11:33 05/26/2017 KMR    Lymphocytes Relative 52 %   Lymphs Abs 0.9 (L) 1.0 - 3.6 K/uL   Monocytes Relative 20 %   Monocytes Absolute 0.4 0.2 - 1.0 K/uL   Eosinophils Relative 8 %   Eosinophils Absolute 0.1 0 - 0.7 K/uL   Basophils Relative 4 %   Basophils Absolute 0.1 0 - 0.1 K/uL  CBC with Differential/Platelet     Status: Abnormal   Collection Time: 06/30/17 11:00 AM  Result Value Ref Range   WBC 2.6 (L) 3.8 - 10.6 K/uL   RBC 4.05 (L) 4.40 - 5.90 MIL/uL   Hemoglobin 11.9 (L) 13.0 - 18.0 g/dL   HCT 34.1 (L) 40.0 - 52.0 %   MCV 84.1 80.0 - 100.0 fL   MCH 29.4 26.0 - 34.0 pg   MCHC 34.9 32.0 - 36.0 g/dL   RDW 15.0 (H) 11.5 - 14.5 %   Platelets 202 150 - 440 K/uL   Neutrophils Relative % 23 %   Neutro Abs 0.6 (L) 1.4 - 6.5 K/uL   Lymphocytes Relative 50 %   Lymphs Abs 1.3 1.0 - 3.6 K/uL   Monocytes Relative 15 %   Monocytes Absolute 0.4 0.2 - 1.0 K/uL   Eosinophils Relative 8 %   Eosinophils Absolute 0.2 0 - 0.7 K/uL   Basophils Relative 4 %   Basophils Absolute 0.1 0 - 0.1 K/uL  CBC with Differential     Status: Abnormal   Collection Time: 07/21/17 11:19 AM  Result Value Ref Range   WBC 3.0 (L) 3.8 - 10.6 K/uL   RBC 3.98 (L) 4.40 - 5.90 MIL/uL   Hemoglobin 11.7 (L) 13.0 - 18.0 g/dL   HCT 32.7 (L) 40.0 - 52.0 %   MCV 82.2 80.0 - 100.0 fL   MCH 29.4 26.0 - 34.0 pg   MCHC 35.8 32.0 - 36.0 g/dL   RDW 14.0 11.5 - 14.5 %   Platelets 271 150 - 440 K/uL   Neutrophils Relative % 35 %   Neutro Abs 1.0 (L) 1.4 - 6.5 K/uL   Lymphocytes Relative 44 %   Lymphs Abs 1.3 1.0 - 3.6 K/uL   Monocytes Relative 11 %  Monocytes Absolute 0.3 0.2 - 1.0 K/uL   Eosinophils Relative 6 %   Eosinophils Absolute 0.2 0 - 0.7 K/uL   Basophils Relative 4 %   Basophils Absolute 0.1 0 - 0.1 K/uL  Basic  metabolic panel     Status: Abnormal   Collection Time: 08/04/17  1:27 PM  Result Value Ref Range   Sodium 136 135 - 145 mmol/L   Potassium 3.9 3.5 - 5.1 mmol/L   Chloride 101 101 - 111 mmol/L   CO2 27 22 - 32 mmol/L   Glucose, Bld 200 (H) 65 - 99 mg/dL   BUN 22 (H) 6 - 20 mg/dL   Creatinine, Ser 1.51 (H) 0.61 - 1.24 mg/dL   Calcium 8.7 (L) 8.9 - 10.3 mg/dL   GFR calc non Af Amer 44 (L) >60 mL/min   GFR calc Af Amer 51 (L) >60 mL/min    Comment: (NOTE) The eGFR has been calculated using the CKD EPI equation. This calculation has not been validated in all clinical situations. eGFR's persistently <60 mL/min signify possible Chronic Kidney Disease.    Anion gap 8 5 - 15  CBC with Differential/Platelet     Status: Abnormal   Collection Time: 08/04/17  1:27 PM  Result Value Ref Range   WBC 2.1 (L) 3.8 - 10.6 K/uL   RBC 3.95 (L) 4.40 - 5.90 MIL/uL   Hemoglobin 11.6 (L) 13.0 - 18.0 g/dL   HCT 33.3 (L) 40.0 - 52.0 %   MCV 84.2 80.0 - 100.0 fL   MCH 29.4 26.0 - 34.0 pg   MCHC 34.9 32.0 - 36.0 g/dL   RDW 15.2 (H) 11.5 - 14.5 %   Platelets 169 150 - 440 K/uL   Neutrophils Relative % 27 %   Neutro Abs 0.6 (L) 1.4 - 6.5 K/uL   Lymphocytes Relative 50 %   Lymphs Abs 1.1 1.0 - 3.6 K/uL   Monocytes Relative 15 %   Monocytes Absolute 0.3 0.2 - 1.0 K/uL   Eosinophils Relative 6 %   Eosinophils Absolute 0.1 0 - 0.7 K/uL   Basophils Relative 2 %   Basophils Absolute 0.0 0 - 0.1 K/uL     STUDIES: No results found. 07/23/15 -  Bone Marrow Biopsy Report  -  nonspecific marrow findings with no diagnostic morphologic or immunophenotypic evidence of hematopoietic neoplasia. Mildly hypercellular marrow for age (40-50%) with myeloid hypoplasia, increased eosinophils, erythroid hyperplasia with mild nonspecific dyserythropoiesis and mild megakaryocytic atypia. Diffuse mild to focally moderate increase in reticulin. Storage iron present. Flow cytometry reports relatively decreased neutrophilic cells  with no immunophenotypic abnormalities or increase in blasts, relatively increased eosinophils (17%) nonspecific. Cytogenetics normal 46XY. SNP microarray result is Normal Male.    ASSESSMENT / PLAN:   Other neutropenia ([place]) 1. Leukopenia/Neutropenia  of unclear origin-as previously stated, extensive workup including multiple bone marrow biopsies failed to reveal clear etiology of persistent neutropenia.   # Patient currently on Granix every week; given the logistics/relative absence of symptoms- recommend switching to every other week; labs every 4 weeks. 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 Stable. On PO iron.   # Rigors/ cold sweat- ~ 6 hours- improved spontaneously; heart murmur- awaiting 2 d echo/ with Dr.Khan.   # Abdominal discomfort- currently resolved. Question diverticulitis versus others.   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   Follow-up with me in 3 months- lab monthly/every 2 weeks Granix [will change to one weekly if ]. Above  plan of care was discussed with the patient/ wife- in detail. They  agree.   Cammie Sickle, MD   08/04/2017 2:35 PM

## 2017-08-05 ENCOUNTER — Ambulatory Visit: Payer: Medicare HMO

## 2017-08-05 ENCOUNTER — Ambulatory Visit: Payer: Medicare HMO | Admitting: Internal Medicine

## 2017-08-05 ENCOUNTER — Other Ambulatory Visit: Payer: Medicare HMO

## 2017-08-18 ENCOUNTER — Inpatient Hospital Stay: Payer: Medicare HMO | Attending: Internal Medicine

## 2017-08-18 DIAGNOSIS — D709 Neutropenia, unspecified: Secondary | ICD-10-CM | POA: Diagnosis present

## 2017-08-18 DIAGNOSIS — D708 Other neutropenia: Secondary | ICD-10-CM

## 2017-08-18 MED ORDER — TBO-FILGRASTIM 480 MCG/0.8ML ~~LOC~~ SOSY
480.0000 ug | PREFILLED_SYRINGE | Freq: Once | SUBCUTANEOUS | Status: AC
  Administered 2017-08-18: 480 ug via SUBCUTANEOUS
  Filled 2017-08-18: qty 0.8

## 2017-08-21 ENCOUNTER — Emergency Department: Payer: Medicare HMO

## 2017-08-21 ENCOUNTER — Encounter: Payer: Self-pay | Admitting: Emergency Medicine

## 2017-08-21 ENCOUNTER — Emergency Department
Admission: EM | Admit: 2017-08-21 | Discharge: 2017-08-22 | Disposition: A | Discharge status: Home / Self Care | Payer: Medicare HMO | Attending: Emergency Medicine | Admitting: Emergency Medicine

## 2017-08-21 DIAGNOSIS — E119 Type 2 diabetes mellitus without complications: Secondary | ICD-10-CM | POA: Insufficient documentation

## 2017-08-21 DIAGNOSIS — K5900 Constipation, unspecified: Secondary | ICD-10-CM | POA: Diagnosis not present

## 2017-08-21 DIAGNOSIS — Z79899 Other long term (current) drug therapy: Secondary | ICD-10-CM | POA: Insufficient documentation

## 2017-08-21 DIAGNOSIS — Z7982 Long term (current) use of aspirin: Secondary | ICD-10-CM | POA: Diagnosis not present

## 2017-08-21 DIAGNOSIS — Z7984 Long term (current) use of oral hypoglycemic drugs: Secondary | ICD-10-CM | POA: Insufficient documentation

## 2017-08-21 DIAGNOSIS — I2510 Atherosclerotic heart disease of native coronary artery without angina pectoris: Secondary | ICD-10-CM | POA: Diagnosis not present

## 2017-08-21 DIAGNOSIS — Z955 Presence of coronary angioplasty implant and graft: Secondary | ICD-10-CM | POA: Insufficient documentation

## 2017-08-21 DIAGNOSIS — Z87891 Personal history of nicotine dependence: Secondary | ICD-10-CM | POA: Diagnosis not present

## 2017-08-21 DIAGNOSIS — R109 Unspecified abdominal pain: Secondary | ICD-10-CM

## 2017-08-21 DIAGNOSIS — I10 Essential (primary) hypertension: Secondary | ICD-10-CM | POA: Diagnosis not present

## 2017-08-21 DIAGNOSIS — R1011 Right upper quadrant pain: Secondary | ICD-10-CM

## 2017-08-21 LAB — TROPONIN I: Troponin I: <0.03 ng/mL (ref <0.03)

## 2017-08-21 LAB — COMPREHENSIVE METABOLIC PANEL
ALK PHOS: 89 U/L (ref 38–126)
ALT: 14 U/L — AB (ref 17–63)
AST: 15 U/L (ref 15–41)
Albumin: 4.5 g/dL (ref 3.5–5.0)
Anion gap: 8 (ref 5–15)
BILIRUBIN TOTAL: 1.1 mg/dL (ref 0.3–1.2)
BUN: 23 mg/dL — AB (ref 6–20)
CALCIUM: 9.3 mg/dL (ref 8.9–10.3)
CHLORIDE: 104 mmol/L (ref 101–111)
CO2: 29 mmol/L (ref 22–32)
CREATININE: 1.64 mg/dL — AB (ref 0.61–1.24)
GFR calc Af Amer: 46 mL/min — ABNORMAL LOW (ref >60)
GFR, EST NON AFRICAN AMERICAN: 40 mL/min — AB (ref >60)
Glucose, Bld: 147 mg/dL — ABNORMAL HIGH (ref 65–99)
Potassium: 4.5 mmol/L (ref 3.5–5.1)
Sodium: 141 mmol/L (ref 135–145)
Total Protein: 7.7 g/dL (ref 6.5–8.1)

## 2017-08-21 LAB — URINALYSIS, COMPLETE (UACMP) WITH MICROSCOPIC
Bacteria, UA: NONE SEEN
Bilirubin Urine: NEGATIVE
GLUCOSE, UA: NEGATIVE mg/dL
Hgb urine dipstick: NEGATIVE
KETONES UR: NEGATIVE mg/dL
LEUKOCYTES UA: NEGATIVE
Nitrite: NEGATIVE
PH: 7 (ref 5.0–8.0)
Protein, ur: 30 mg/dL — AB
Specific Gravity, Urine: 1.012 (ref 1.005–1.030)

## 2017-08-21 LAB — CBC
HCT: 38.1 % — ABNORMAL LOW (ref 40.0–52.0)
Hemoglobin: 13.3 g/dL (ref 13.0–18.0)
MCH: 29.7 pg (ref 26.0–34.0)
MCHC: 35 g/dL (ref 32.0–36.0)
MCV: 84.8 fL (ref 80.0–100.0)
PLATELETS: 179 10*3/uL (ref 150–440)
RBC: 4.49 MIL/uL (ref 4.40–5.90)
RDW: 16 % — AB (ref 11.5–14.5)
WBC: 2.4 10*3/uL — AB (ref 3.8–10.6)

## 2017-08-21 LAB — LIPASE, BLOOD: LIPASE: 45 U/L (ref 11–51)

## 2017-08-21 MED ORDER — SODIUM CHLORIDE 0.9 % IV BOLUS (SEPSIS)
1000.0000 mL | Freq: Once | INTRAVENOUS | Status: AC
  Administered 2017-08-21: 1000 mL via INTRAVENOUS

## 2017-08-21 NOTE — ED Notes (Signed)
Patient transported to Ultrasound 

## 2017-08-21 NOTE — ED Triage Notes (Signed)
States x 2 days has had pain upper r abd and flank. Denies dysuria.

## 2017-08-21 NOTE — ED Notes (Signed)
ED Provider at bedside. 

## 2017-08-22 ENCOUNTER — Emergency Department: Payer: Medicare HMO

## 2017-08-22 MED ORDER — POLYETHYLENE GLYCOL 3350 17 G PO PACK: 17.0000 g | PACK | Freq: Every day | ORAL | 0 refills | Status: AC

## 2017-08-22 NOTE — ED Provider Notes (Addendum)
[facility] Emergency Department Provider Note   ____________________________________________   First MD Initiated Contact with Patient 08/21/17 2309     (approximate)  I have reviewed the triage vital signs and the nursing notes.   HISTORY  Chief Complaint Flank Pain and Abdominal Pain    HPI Brandon Wilkerson is a 76 y.o. male who comes into the hospital today with some right upper quadrant and flank pain. The patient states that it started about 5-6 days ago and it worsened on Wednesday. The patient states that sometimes it feels as though the pain is connected. He has not been taking anything for pain at home. The patient had some nausea with no vomiting. He denies any fevers but did not check his temperature. The patient states that his pain is currently a 5 out of 10 in intensity. Stretching out helps a little bit and sitting up helps. The patient denies blood in his urine or pain with urination. He had a bowel movement today and the last time was on Wednesday. Seems to make the pain better or worse. The patient decided to come into the hospital today for further evaluation of his symptoms.   Past Medical History:  Diagnosis Date  . Arthritis   . Chronic leukopenia   . Diabetes mellitus without complication (HCC)   . Hypertension   . PVD (peripheral vascular disease) (HCC)   . Rheumatic aortic stenosis   . Sleep apnea     Patient Active Problem List   Diagnosis Date Noted  . Other neutropenia (HCC) 10/08/2016  . Unstable angina (HCC) 04/26/2016  . AKI (acute kidney injury) (HCC) 04/26/2016  . HTN (hypertension) 04/26/2016  . Type 2 diabetes mellitus (HCC) 04/26/2016  . CAD (coronary artery disease) 04/26/2016  . TIA (transient ischemic attack) 04/26/2016  . Neutropenia (HCC) 05/10/2013    Past Surgical History:  Procedure Laterality Date  . APPENDECTOMY    . CARDIAC CATHETERIZATION Right 04/27/2016   Procedure: Left Heart Cath and  Coronary Angiography;  Surgeon: Laurier Nancy, MD;  Location: [facility] INVASIVE CV LAB;  Service: Cardiovascular;  Laterality: Right;  For 04/27/16 am  . CARDIAC CATHETERIZATION N/A 04/27/2016   Procedure: Coronary Stent Intervention;  Surgeon: Alwyn Pea, MD;  Location: [facility] INVASIVE CV LAB;  Service: Cardiovascular;  Laterality: N/A;  . CORONARY ANGIOPLASTY WITH STENT PLACEMENT      Prior to Admission medications   Medication Sig Start Date End Date Taking? Authorizing Provider  amLODipine (NORVASC) 10 MG tablet Take 10 mg by mouth daily.    [provider]  aspirin 81 MG tablet Take 81 mg by mouth daily.    [provider]  atorvastatin (LIPITOR) 80 MG tablet Take 80 mg by mouth daily.    [provider]  carvedilol (COREG) 6.25 MG tablet Take 12.5 mg by mouth 2 (two) times daily with a meal.     [provider]  cloNIDine (CATAPRES) 0.3 MG tablet Take 0.3 mg by mouth 2 (two) times daily.    [provider]  clopidogrel (PLAVIX) 75 MG tablet Take 75 mg by mouth daily.    [provider]  Dulaglutide (TRULICITY [place]) Inject 1.5 Units into the skin daily.     [provider]  ferrous sulfate 325 (65 FE) MG EC tablet Take 325 mg by mouth daily.    [provider]  fluticasone (FLONASE) 50 MCG/ACT nasal spray Place 1 spray into both nostrils daily.    [provider]  gabapentin (NEURONTIN) 300 MG capsule Take 1 capsule (300 mg total) by mouth 3 (three) times daily. Patient taking differently: Take 600 mg by mouth 2 (two) times daily.  04/28/16   Enid Baas, MD  glipiZIDE (GLUCOTROL) 5 MG tablet Take 2.5 mg by mouth 2 (two) times daily.    [provider]  isosorbide mononitrate (IMDUR) 60 MG 24 hr tablet Take 60 mg by mouth daily.    [provider]  nitroGLYCERIN (NITROSTAT) 0.4 MG SL tablet Place 0.4 mg under the tongue every 5 (five) minutes as needed for chest pain.    [provider]  ONE TOUCH ULTRA TEST test strip use as directed;  twice a day OR MORE if needed 11/08/15   [provider]  polyethylene glycol (MIRALAX) packet Take 17 g by mouth daily. 08/22/17   Rebecka Apley, MD  Vitamin D, Ergocalciferol, (DRISDOL) 50000 units CAPS capsule Take 50,000 Units by mouth every 30 (thirty) days. 03/19/17   [provider]    Allergies Pollen extract  Family History  Problem Relation Age of Onset  . Diabetes Unknown   . Heart disease Unknown   . Hypertension Unknown   . Breast cancer Unknown     Social History Social History  Substance Use Topics  . Smoking status: Former Smoker    Types: Cigarettes    Quit date: 11/22/1979  . Smokeless tobacco: Never Used  . Alcohol use No    Review of Systems  Constitutional: No fever/chills Eyes: No visual changes. ENT: No sore throat. Cardiovascular: Denies chest pain. Respiratory: Denies shortness of breath. Gastrointestinal: abdominal pain, nausea, no vomiting.  No diarrhea.  No constipation. Genitourinary: Negative for dysuria. Musculoskeletal: right flank pain. Skin: Negative for rash. Neurological: Negative for headaches, focal weakness or numbness.   ____________________________________________   PHYSICAL EXAM:  VITAL SIGNS: ED Triage Vitals  Enc Vitals Group     BP 08/21/17 1851 (!) 165/72     Pulse Rate 08/21/17 1851 61     Resp 08/21/17 1851 18     Temp 08/21/17 1851 98.7 F (37.1 C)     Temp Source 08/21/17 1851 Oral     SpO2 08/21/17 1851 98 %     Weight 08/21/17 1855 209 lb (94.8 kg)     Height 08/21/17 1855 6' 1.5" (1.867 m)     Head Circumference --      Peak Flow --      Pain Score 08/21/17 1850 7     Pain Loc --      Pain Edu? --      Excl. in GC? --     Constitutional: Alert and oriented. Well appearing and in mild distress. Eyes: Conjunctivae are normal. PERRL. EOMI. Head: Atraumatic. Nose: No congestion/rhinnorhea. Mouth/Throat: Mucous  membranes are moist.  Oropharynx non-erythematous. Cardiovascular: Normal rate, regular rhythm. Loud systolic murmur  Good peripheral circulation. Respiratory: Normal respiratory effort.  No retractions. Lungs CTAB. Gastrointestinal: Softwith some mild right upper quadrant tenderness to palpation. No distention. positive bowel sounds, mild right CVA tenderness to palpation Musculoskeletal: No lower extremity tenderness nor edema.   Neurologic:  Normal speech and language.  Skin:  Skin is warm, dry and intact.  Psychiatric: Mood and affect are normal.   ____________________________________________   LABS (all labs ordered are listed, but only abnormal results are displayed)  Labs Reviewed  COMPREHENSIVE METABOLIC PANEL - Abnormal; Notable for the following:       Result Value  Glucose, Bld 147 (*)    BUN 23 (*)    Creatinine, Ser 1.64 (*)    ALT 14 (*)    GFR calc non Af Amer 40 (*)    GFR calc Af Amer 46 (*)    All other components within normal limits  CBC - Abnormal; Notable for the following:    WBC 2.4 (*)    HCT 38.1 (*)    RDW 16.0 (*)    All other components within normal limits  URINALYSIS, COMPLETE (UACMP) WITH MICROSCOPIC - Abnormal; Notable for the following:    Color, Urine YELLOW (*)    APPearance CLEAR (*)    Protein, ur 30 (*)    Squamous Epithelial / LPF 0-5 (*)    All other components within normal limits  LIPASE, BLOOD  TROPONIN I   ____________________________________________  EKG  ED ECG REPORT I, Rebecka Apley, the attending physician, personally viewed and interpreted this ECG.   Date: 08/21/2017  EKG Time:1852   Rate: 59  Rhythm: sinus tachycardia  Axis: normal  Intervals:none  ST&T Change: flipped t waves in lead III  ____________________________________________  RADIOLOGY  Ct Renal Stone Study  Result Date: 08/22/2017 CLINICAL DATA:  Right upper abdominal and flank pain x2 days. EXAM: CT ABDOMEN AND PELVIS WITHOUT CONTRAST  TECHNIQUE: Multidetector CT imaging of the abdomen and pelvis was performed following the standard protocol without IV contrast. COMPARISON:  05/15/2016 CT FINDINGS: Lower chest: Mild cardiomegaly without pericardial effusion. Aortic atherosclerosis especially at the aortic root, similar in appearance to prior. Multi-vessel coronary arteriosclerosis of the visualized coronary arteries, current exam the circumflex and RCA. Trace right effusion or pleural thickening. Hepatobiliary: No focal liver abnormality is seen. No gallstones, gallbladder wall thickening, or biliary dilatation. Pancreas: No pancreatic ductal dilatation or surrounding inflammatory changes. Spleen: Normal in size without focal abnormality. Adrenals/Urinary Tract: Normal bilateral adrenal glands. Chronic nonspecific perinephric fat stranding without obstructive uropathy. No nephrolithiasis renovascular calcifications are noted of the right kidney. The urinary bladder is physiologically distended without focal mural thickening, mass or calculus. Stomach/Bowel: Stomach is within normal limits. Appendix appears normal. No evidence of bowel wall thickening, distention, or inflammatory changes. HO and moderate to marked amount of fecal retention is noted throughout large bowel consistent with constipation. Vascular/Lymphatic: Aortic atherosclerosis. No enlarged abdominal or pelvic lymph nodes. Reproductive: Prostate and seminal vesicles are unremarkable. Other: No abdominal wall hernia or abnormality. No abdominopelvic ascites. Musculoskeletal: Old healed fractures of the posterior right eighth and ninth ribs. No aggressive appearing lytic or blastic disease. Thoracolumbar spondylosis with multilevel moderate-to-marked degenerative disc disease. 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 IMPRESSION: 1. Large amount of fecal residue throughout the colon consistent with constipation. No  bowel obstruction or inflammation. 2. Coronary arteriosclerosis and aortic atherosclerosis. No aneurysm. 3. No obstructive uropathy. 4. Thoracolumbar spondylosis. Old healed right posterior eighth ninth rib fractures. No acute osseous abnormality. Electronically Signed   By: Tollie Eth M.D.   On: 08/22/2017 01:41   US Abdomen Limited Ruq  Result Date: 08/22/2017 CLINICAL DATA:  Right upper quadrant pain x2 days. EXAM: ULTRASOUND ABDOMEN LIMITED RIGHT UPPER QUADRANT COMPARISON:  07/25/2015 abdominal ultrasound. FINDINGS: Gallbladder: No gallstones or wall thickening visualized. No sonographic Murphy sign noted by sonographer. Common bile duct: Diameter: 6.6 mm. Liver: No focal lesion identified. Within normal limits in parenchymal echogenicity. Portal vein is patent on color Doppler imaging with normal direction of blood flow towards  the liver. IMPRESSION: Unremarkable right upper quadrant ultrasound. Electronically Signed   By: Tollie Ethwon M.D.   On: 08/22/2017 00:51    ____________________________________________   PROCEDURES  Procedure(s) performed: None  Procedures  Critical Care performed: No  ____________________________________________   INITIAL IMPRESSION / ASSESSMENT AND PLAN / ED COURSE  Pertinent labs & imaging results that were available during my care of the patient were reviewed by me and considered in my medical decision making (see chart for details).  This is a 76 year old man who comes into the hospital today with some right upper quadrant abdominal pain. The patient's concern is that this may be due to his gallbladder. I sent the patient for an ultrasound although his blood work was fairly unremarkable. The patient's ultrasound was negative. I then sent the patient for a renal stone study to evaluate for possible kidney stone and that was also negative. The patient did have a significant amount of stool throughout his colon consistent with constipation. This may be the  cause of the patient's pain. I will discharge the patient home to have him follow-up with his primary care physician. The patient at this time is in no significant distress. He will be discharged to home.      ____________________________________________   FINAL CLINICAL IMPRESSION(S) / ED DIAGNOSES  Final diagnoses:  Right upper quadrant abdominal pain  Constipation, unspecified constipation type  Flank pain      NEW MEDICATIONS STARTED DURING THIS VISIT:  New Prescriptions   POLYETHYLENE GLYCOL (MIRALAX) PACKET    Take 17 g by mouth daily.     Note:  This document was prepared using Dragon voice recognition software and may include unintentional dictation errors.    Rebecka Apley, MD 08/22/17 0205    Rebecka Apley, MD 08/22/17 [phone]

## 2017-08-22 NOTE — Discharge Instructions (Signed)
Please follow up with your primary care physician for further evaluation of your symptoms.  °

## 2017-08-22 NOTE — ED Notes (Signed)
Urged patient to void. 

## 2017-09-01 ENCOUNTER — Telehealth: Payer: Self-pay | Admitting: *Deleted

## 2017-09-01 ENCOUNTER — Inpatient Hospital Stay: Payer: Medicare HMO

## 2017-09-01 DIAGNOSIS — D708 Other neutropenia: Secondary | ICD-10-CM

## 2017-09-01 DIAGNOSIS — D709 Neutropenia, unspecified: Secondary | ICD-10-CM | POA: Diagnosis not present

## 2017-09-01 LAB — CBC WITH DIFFERENTIAL/PLATELET
Basophils Absolute: 0 10*3/uL (ref 0–0.1)
Basophils Relative: 3 %
EOS ABS: 0.1 10*3/uL (ref 0–0.7)
Eosinophils Relative: 6 %
HCT: 32.8 % — ABNORMAL LOW (ref 40.0–52.0)
Hemoglobin: 11.5 g/dL — ABNORMAL LOW (ref 13.0–18.0)
LYMPHS ABS: 0.8 10*3/uL — AB (ref 1.0–3.6)
LYMPHS PCT: 52 %
MCH: 29.9 pg (ref 26.0–34.0)
MCHC: 35.2 g/dL (ref 32.0–36.0)
MCV: 84.9 fL (ref 80.0–100.0)
MONOS PCT: 31 %
Monocytes Absolute: 0.5 10*3/uL (ref 0.2–1.0)
NEUTROS PCT: 8 %
Neutro Abs: 0.1 10*3/uL — ABNORMAL LOW (ref 1.4–6.5)
Platelets: 146 10*3/uL — ABNORMAL LOW (ref 150–440)
RBC: 3.87 MIL/uL — AB (ref 4.40–5.90)
RDW: 14.8 % — ABNORMAL HIGH (ref 11.5–14.5)
WBC: 1.5 10*3/uL — ABNORMAL LOW (ref 3.8–10.6)

## 2017-09-01 MED ORDER — TBO-FILGRASTIM 480 MCG/0.8ML ~~LOC~~ SOSY
480.0000 ug | PREFILLED_SYRINGE | Freq: Once | SUBCUTANEOUS | Status: AC
  Administered 2017-09-01: 480 ug via SUBCUTANEOUS
  Filled 2017-09-01: qty 0.8

## 2017-09-01 NOTE — Telephone Encounter (Signed)
Received call from [facility] in cancer center lab- @ 1:43PM 09/01/2017. anc level is 0.1 today. Read back process performed with lab tech and  Dr. Susa Day  143 pm

## 2017-09-01 NOTE — Progress Notes (Signed)
ANC today is 0.1 so Granix given.  Confirmed with Dr. B that patient is to keep future appts as scheduled, no change in plan at this time.

## 2017-09-15 ENCOUNTER — Inpatient Hospital Stay: Payer: Medicare HMO | Attending: Internal Medicine

## 2017-09-15 DIAGNOSIS — D708 Other neutropenia: Secondary | ICD-10-CM

## 2017-09-15 DIAGNOSIS — D709 Neutropenia, unspecified: Secondary | ICD-10-CM | POA: Insufficient documentation

## 2017-09-15 MED ORDER — TBO-FILGRASTIM 480 MCG/0.8ML ~~LOC~~ SOSY
480.0000 ug | PREFILLED_SYRINGE | Freq: Once | SUBCUTANEOUS | Status: AC
  Administered 2017-09-15: 480 ug via SUBCUTANEOUS

## 2017-09-29 ENCOUNTER — Inpatient Hospital Stay: Payer: Medicare HMO

## 2017-09-29 DIAGNOSIS — D708 Other neutropenia: Secondary | ICD-10-CM

## 2017-09-29 DIAGNOSIS — D709 Neutropenia, unspecified: Secondary | ICD-10-CM | POA: Diagnosis not present

## 2017-09-29 LAB — CBC WITH DIFFERENTIAL/PLATELET
Basophils Absolute: 0.1 10*3/uL (ref 0–0.1)
Basophils Relative: 3 %
EOS PCT: 8 %
Eosinophils Absolute: 0.2 10*3/uL (ref 0–0.7)
HEMATOCRIT: 34.7 % — AB (ref 40.0–52.0)
Hemoglobin: 12.1 g/dL — ABNORMAL LOW (ref 13.0–18.0)
LYMPHS ABS: 1.1 10*3/uL (ref 1.0–3.6)
LYMPHS PCT: 59 %
MCH: 29.2 pg (ref 26.0–34.0)
MCHC: 35 g/dL (ref 32.0–36.0)
MCV: 83.6 fL (ref 80.0–100.0)
MONO ABS: 0.3 10*3/uL (ref 0.2–1.0)
Monocytes Relative: 18 %
Neutro Abs: 0.2 10*3/uL — ABNORMAL LOW (ref 1.4–6.5)
Neutrophils Relative %: 12 %
PLATELETS: 175 10*3/uL (ref 150–440)
RBC: 4.15 MIL/uL — ABNORMAL LOW (ref 4.40–5.90)
RDW: 15.3 % — AB (ref 11.5–14.5)
WBC: 1.9 10*3/uL — ABNORMAL LOW (ref 3.8–10.6)

## 2017-09-29 MED ORDER — TBO-FILGRASTIM 480 MCG/0.8ML ~~LOC~~ SOSY
480.0000 ug | PREFILLED_SYRINGE | Freq: Once | SUBCUTANEOUS | Status: AC
  Administered 2017-09-29: 480 ug via SUBCUTANEOUS

## 2017-10-13 ENCOUNTER — Inpatient Hospital Stay: Payer: Medicare HMO | Attending: Internal Medicine

## 2017-10-13 DIAGNOSIS — Z803 Family history of malignant neoplasm of breast: Secondary | ICD-10-CM | POA: Insufficient documentation

## 2017-10-13 DIAGNOSIS — I129 Hypertensive chronic kidney disease with stage 1 through stage 4 chronic kidney disease, or unspecified chronic kidney disease: Secondary | ICD-10-CM | POA: Insufficient documentation

## 2017-10-13 DIAGNOSIS — D709 Neutropenia, unspecified: Secondary | ICD-10-CM | POA: Diagnosis not present

## 2017-10-13 DIAGNOSIS — R011 Cardiac murmur, unspecified: Secondary | ICD-10-CM | POA: Insufficient documentation

## 2017-10-13 DIAGNOSIS — E1122 Type 2 diabetes mellitus with diabetic chronic kidney disease: Secondary | ICD-10-CM | POA: Diagnosis not present

## 2017-10-13 DIAGNOSIS — G4730 Sleep apnea, unspecified: Secondary | ICD-10-CM | POA: Insufficient documentation

## 2017-10-13 DIAGNOSIS — Z6379 Other stressful life events affecting family and household: Secondary | ICD-10-CM | POA: Insufficient documentation

## 2017-10-13 DIAGNOSIS — N183 Chronic kidney disease, stage 3 (moderate): Secondary | ICD-10-CM | POA: Diagnosis not present

## 2017-10-13 DIAGNOSIS — Z8572 Personal history of non-Hodgkin lymphomas: Secondary | ICD-10-CM | POA: Diagnosis not present

## 2017-10-13 DIAGNOSIS — Z79899 Other long term (current) drug therapy: Secondary | ICD-10-CM | POA: Insufficient documentation

## 2017-10-13 DIAGNOSIS — D631 Anemia in chronic kidney disease: Secondary | ICD-10-CM | POA: Diagnosis not present

## 2017-10-13 DIAGNOSIS — Z7982 Long term (current) use of aspirin: Secondary | ICD-10-CM | POA: Diagnosis not present

## 2017-10-13 DIAGNOSIS — I2510 Atherosclerotic heart disease of native coronary artery without angina pectoris: Secondary | ICD-10-CM | POA: Insufficient documentation

## 2017-10-13 DIAGNOSIS — D509 Iron deficiency anemia, unspecified: Secondary | ICD-10-CM | POA: Diagnosis not present

## 2017-10-13 DIAGNOSIS — D649 Anemia, unspecified: Secondary | ICD-10-CM | POA: Diagnosis not present

## 2017-10-13 DIAGNOSIS — E1151 Type 2 diabetes mellitus with diabetic peripheral angiopathy without gangrene: Secondary | ICD-10-CM | POA: Insufficient documentation

## 2017-10-13 DIAGNOSIS — D708 Other neutropenia: Secondary | ICD-10-CM

## 2017-10-13 MED ORDER — TBO-FILGRASTIM 480 MCG/0.8ML ~~LOC~~ SOSY
480.0000 ug | PREFILLED_SYRINGE | Freq: Once | SUBCUTANEOUS | Status: AC
  Administered 2017-10-13: 480 ug via SUBCUTANEOUS
  Filled 2017-10-13: qty 0.8

## 2017-10-27 ENCOUNTER — Inpatient Hospital Stay (HOSPITAL_BASED_OUTPATIENT_CLINIC_OR_DEPARTMENT_OTHER): Payer: Medicare HMO | Admitting: Internal Medicine

## 2017-10-27 ENCOUNTER — Other Ambulatory Visit: Payer: Self-pay

## 2017-10-27 ENCOUNTER — Inpatient Hospital Stay: Payer: Medicare HMO

## 2017-10-27 VITALS — BP 134/74 | HR 63 | Temp 98.6°F | Resp 20 | Ht 73.5 in | Wt 206.0 lb

## 2017-10-27 DIAGNOSIS — E1151 Type 2 diabetes mellitus with diabetic peripheral angiopathy without gangrene: Secondary | ICD-10-CM | POA: Diagnosis not present

## 2017-10-27 DIAGNOSIS — I2510 Atherosclerotic heart disease of native coronary artery without angina pectoris: Secondary | ICD-10-CM

## 2017-10-27 DIAGNOSIS — E1122 Type 2 diabetes mellitus with diabetic chronic kidney disease: Secondary | ICD-10-CM

## 2017-10-27 DIAGNOSIS — D649 Anemia, unspecified: Secondary | ICD-10-CM | POA: Diagnosis not present

## 2017-10-27 DIAGNOSIS — R011 Cardiac murmur, unspecified: Secondary | ICD-10-CM

## 2017-10-27 DIAGNOSIS — D708 Other neutropenia: Secondary | ICD-10-CM

## 2017-10-27 DIAGNOSIS — D709 Neutropenia, unspecified: Secondary | ICD-10-CM

## 2017-10-27 DIAGNOSIS — D509 Iron deficiency anemia, unspecified: Secondary | ICD-10-CM | POA: Diagnosis not present

## 2017-10-27 DIAGNOSIS — D631 Anemia in chronic kidney disease: Secondary | ICD-10-CM

## 2017-10-27 DIAGNOSIS — Z79899 Other long term (current) drug therapy: Secondary | ICD-10-CM

## 2017-10-27 DIAGNOSIS — Z6379 Other stressful life events affecting family and household: Secondary | ICD-10-CM

## 2017-10-27 DIAGNOSIS — N183 Chronic kidney disease, stage 3 (moderate): Secondary | ICD-10-CM | POA: Diagnosis not present

## 2017-10-27 DIAGNOSIS — I129 Hypertensive chronic kidney disease with stage 1 through stage 4 chronic kidney disease, or unspecified chronic kidney disease: Secondary | ICD-10-CM

## 2017-10-27 DIAGNOSIS — G4730 Sleep apnea, unspecified: Secondary | ICD-10-CM | POA: Diagnosis not present

## 2017-10-27 DIAGNOSIS — Z7982 Long term (current) use of aspirin: Secondary | ICD-10-CM

## 2017-10-27 DIAGNOSIS — Z803 Family history of malignant neoplasm of breast: Secondary | ICD-10-CM

## 2017-10-27 DIAGNOSIS — Z8572 Personal history of non-Hodgkin lymphomas: Secondary | ICD-10-CM

## 2017-10-27 LAB — CBC WITH DIFFERENTIAL/PLATELET
BASOS PCT: 0 %
Basophils Absolute: 0 10*3/uL (ref 0–0.1)
Eosinophils Absolute: 0.1 10*3/uL (ref 0–0.7)
Eosinophils Relative: 5 %
HEMATOCRIT: 32.2 % — AB (ref 40.0–52.0)
HEMOGLOBIN: 11.3 g/dL — AB (ref 13.0–18.0)
LYMPHS ABS: 0.6 10*3/uL — AB (ref 1.0–3.6)
Lymphocytes Relative: 50 %
MCH: 29.1 pg (ref 26.0–34.0)
MCHC: 35.2 g/dL (ref 32.0–36.0)
MCV: 82.8 fL (ref 80.0–100.0)
MONO ABS: 0.3 10*3/uL (ref 0.2–1.0)
MONOS PCT: 23 %
NEUTROS ABS: 0.3 10*3/uL — AB (ref 1.4–6.5)
Neutrophils Relative %: 22 %
Platelets: 171 10*3/uL (ref 150–440)
RBC: 3.89 MIL/uL — ABNORMAL LOW (ref 4.40–5.90)
RDW: 14.7 % — AB (ref 11.5–14.5)
WBC: 1.3 10*3/uL — CL (ref 3.8–10.6)

## 2017-10-27 LAB — COMPREHENSIVE METABOLIC PANEL
ALBUMIN: 3.4 g/dL — AB (ref 3.5–5.0)
ALK PHOS: 77 U/L (ref 38–126)
ALT: 17 U/L (ref 17–63)
AST: 15 U/L (ref 15–41)
Anion gap: 9 (ref 5–15)
BUN: 23 mg/dL — ABNORMAL HIGH (ref 6–20)
CALCIUM: 8.6 mg/dL — AB (ref 8.9–10.3)
CHLORIDE: 102 mmol/L (ref 101–111)
CO2: 24 mmol/L (ref 22–32)
CREATININE: 1.52 mg/dL — AB (ref 0.61–1.24)
GFR calc Af Amer: 50 mL/min — ABNORMAL LOW (ref >60)
GFR calc non Af Amer: 43 mL/min — ABNORMAL LOW (ref >60)
GLUCOSE: 139 mg/dL — AB (ref 65–99)
Potassium: 3.7 mmol/L (ref 3.5–5.1)
SODIUM: 135 mmol/L (ref 135–145)
Total Bilirubin: 0.7 mg/dL (ref 0.3–1.2)
Total Protein: 7 g/dL (ref 6.5–8.1)

## 2017-10-27 MED ORDER — TBO-FILGRASTIM 480 MCG/0.8ML ~~LOC~~ SOSY
480.0000 ug | PREFILLED_SYRINGE | Freq: Once | SUBCUTANEOUS | Status: AC
  Administered 2017-10-27: 480 ug via SUBCUTANEOUS
  Filled 2017-10-27: qty 0.8

## 2017-10-27 MED ORDER — LEVOFLOXACIN 500 MG PO TABS: 500.0000 mg | ORAL_TABLET | Freq: Every day | ORAL | 0 refills | Status: AC

## 2017-10-27 NOTE — Progress Notes (Signed)
[facility]  [phone]) [phone] [phone]) [phone]     ID: Brandon Wilkerson OB: Dec 03, 1941  MR#: [id number]  CSN#:[id number]  Patient Care Team: Jodi Marble, MD as PCP - General (Internal Medicine)  CHIEF COMPLAINT/DIAGNOSIS:  Leukopenia/Neutropenia, Mild Anemia - Mild, asymptomatic, of unclear etiology. Workup so far unremarkable as detailed below.  Bone marrow biopsy 05/10/13 - no diagnostic morphologic evidence of B-cell lymphoproliferative disorder or other hematopoietic neoplasia identified.  Normocellular to slightly hypercellular marrow for age 55-50% with maturing trilineage hematopoiesis, storage iron present, slight patchy increase in reticulin  Flow study negative for any B-cell monoclonality, nonspecific myeloid and normocytic findings with no increase in blasts,relatively increased eosinophils 7% is nonspecific,relatively increased normal B-cell precursors (hematogones)and.  Normal male karyotype, 46XY.  Labs done on 11/02/12 - Hb 13.3, platelets 172K, retic 0.036, WBC 3400 with 38% neutrophils, 35% lymphocytes, 10% variant lymphocytes, 12% monocytes. Iron study, B12, folate, ANA, serum immunoelectrophoresis (SIEP), PT, PTT, LDH, haptoglobin, Coombs test, HBsAg, HCV antibody, and HIV antibody all unremarkable.   05/03/13 - Peripheral blood Flow Cytometry. A very small (0.3% of leukocytes) B cell clone is detected. Previous phenotyping results showed similar findings. The clone is very small and is not diagnostic of any specific type of lymphoma. If the patient has a previous history of lymphoma, the finding is consistent with persistent involvement by lymphoma. 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  07/23/15 -  Bone Marrow Biopsy Report  -  nonspecific marrow findings with no diagnostic morphologic or immunophenotypic evidence of hematopoietic neoplasia. Mildly hypercellular marrow for age (40-50%) with myeloid hypoplasia,  increased eosinophils, erythroid hyperplasia with mild nonspecific dyserythropoiesis and mild megakaryocytic atypia. Diffuse mild to focally moderate increase in reticulin. Storage iron present. Flow cytometry reports relatively decreased neutrophilic cells with no immunophenotypic abnormalities or increase in blasts, relatively increased eosinophils (17%) nonspecific. Cytogenetics normal 46XY. SNP microarray result is Normal Male. The treatment with short course of steroids was attempted in November 2016, however there was no response. Treatment with G-CSF was initiated in early December 2016  HISTORY OF PRESENT ILLNESS:   76 year old male patient with a history of neutropenia of unclear etiology/ currently on granix every 2 weeks is here for follow-up.  Patient underwent a 2D echo through his PCP/cardiology-worsening murmur.  As per patient no new recommendations.  Patient denies any fevers or chills.  Denies any shortness of breath or cough.  Does admit to sniffles.  He states that he has been under a lot of stress more recently taking care of his cousin who has intellectual disabilities.  He had to drive 469 miles in 1 day.  Complains of diarrhea and stomach upset after the visit.  Currently resolved. In the interim patient has not had any hospitalizations. Denies any need for antibiotics. No infections No weight loss. His chronic tingling and numbness in his left foot not any worse.  REVIEW  OF SYSTEMS:   ROS: As in HPI above. In addition, no new headaches or focal weakness.  No sore throat, cough, shortness of breath, sputum, hemoptysis or chest pain. No abdominal pain, constipation, diarrhea, dysuria or hematuria.   PAST MEDICAL HISTORY: Reviewed.         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 As above.  FAMILY HISTORY: Reviewed. Remarkable  for diabetes, heart disease, hypertension, breast cancer.   SOCIAL HISTORY: Reviewed. Nonsmoker.  Denies alcohol or recreational drug usage.    Allergies  Allergen Reactions  . Pollen Extract Other (See Comments)    Runny nose    Current Outpatient Medications  Medication Sig Dispense Refill  . amLODipine (NORVASC) 10 MG tablet Take 10 mg by mouth daily.    Marland Kitchen aspirin 81 MG tablet Take 81 mg by mouth daily.    Marland Kitchen atorvastatin (LIPITOR) 80 MG tablet Take 80 mg by mouth daily.    . carvedilol (COREG) 6.25 MG tablet Take 12.5 mg by mouth 2 (two) times daily with a meal.     . cloNIDine (CATAPRES) 0.3 MG tablet Take 0.3 mg by mouth 2 (two) times daily.    . clopidogrel (PLAVIX) 75 MG tablet Take 75 mg by mouth daily.    . Dulaglutide (TRULICITY [place]) Inject 1.5 Units into the skin daily.     . ferrous sulfate 325 (65 FE) MG EC tablet Take 325 mg by mouth daily.    . fluticasone (FLONASE) 50 MCG/ACT nasal spray Place 1 spray into both nostrils daily.    Marland Kitchen gabapentin (NEURONTIN) 300 MG capsule Take 1 capsule (300 mg total) by mouth 3 (three) times daily. (Patient taking differently: Take 600 mg by mouth 2 (two) times daily. ) 90 capsule 0  . glipiZIDE (GLUCOTROL) 5 MG tablet Take 2.5 mg by mouth 2 (two) times daily.    . isosorbide mononitrate (IMDUR) 60 MG 24 hr tablet Take 60 mg by mouth daily.    . ONE TOUCH ULTRA TEST test strip use as directed;  twice a day OR MORE if needed  1  . polyethylene glycol (MIRALAX) packet Take 17 g by mouth daily. 14 each 0  . Vitamin D, Ergocalciferol, (DRISDOL) 50000 units CAPS capsule Take 50,000 Units by mouth every 30 (thirty) days.  3  . levofloxacin (LEVAQUIN) 500 MG tablet Take 1 tablet (500 mg total) by mouth daily. Call MD before taking the anti-biotic 7 tablet 0  . nitroGLYCERIN (NITROSTAT) 0.4 MG SL tablet Place 0.4 mg under the tongue every 5 (five) minutes as needed for chest pain.      No current facility-administered medications for this visit.     PHYSICAL EXAM: Vitals:   10/27/17 1358  BP: 134/74  Pulse: 63  Resp: 20  Temp: 98.6 F (37 C)     Body mass index is 26.81 kg/m.     BP 134/74 (BP Location: Left Arm, Patient Position: Sitting)   Pulse 63   Temp 98.6 F (37 C) (Oral)   Resp 20   Ht 6' 1.5" (1.867 m)   Wt 206 lb (93.4 kg)   BMI 26.81 kg/m   General Appearance:    Alert, cooperative, no distress, appears stated age;  accompanied by his wife   Head:    Normocephalic, without obvious abnormality, atraumatic  Eyes:    PERRL, conjunctiva/corneas clear, EOM's intact, fundi    benign, both eyes       Ears:    Normal TM's and external ear canals, both ears  Nose:   Nares normal, septum midline, mucosa normal, no drainage   or sinus tenderness  Throat:   Lips, mucosa, and tongue normal; teeth and gums normal  Neck:   Supple, symmetrical, trachea midline, no adenopathy;       thyroid:  No enlargement/tenderness/nodules; carotid   bruit on L  Back:     Symmetric, no  curvature, ROM normal, no CVA tenderness  Lungs:     Clear to auscultation bilaterally, respirations unlabored  Chest wall:    No tenderness or deformity  Heart:    Regular rate and rhythm, S1 and S2 normal, systolic ejection murmur 3 out of 6 on aorta   Abdomen:     Soft, non-tender, bowel sounds active all four quadrants,    no masses, no organomegaly  Extremities:   Extremities normal, atraumatic, no cyanosis or edema  Pulses:   2+ and symmetric all extremities  Skin:   Skin color, texture, turgor normal, no rashes or lesions  Lymph nodes:   Cervical, supraclavicular, and axillary nodes normal  Neurologic:   CNII-XII intact. Normal strength, sensation and reflexes      throughout     LAB RESULTS: Recent Results (from the past 2160 hour(s))  Basic metabolic panel     Status: Abnormal   Collection Time: 08/04/17  1:27 PM  Result Value Ref Range   Sodium 136 135 - 145 mmol/L    Potassium 3.9 3.5 - 5.1 mmol/L   Chloride 101 101 - 111 mmol/L   CO2 27 22 - 32 mmol/L   Glucose, Bld 200 (H) 65 - 99 mg/dL   BUN 22 (H) 6 - 20 mg/dL   Creatinine, Ser 1.51 (H) 0.61 - 1.24 mg/dL   Calcium 8.7 (L) 8.9 - 10.3 mg/dL   GFR calc non Af Amer 44 (L) >60 mL/min   GFR calc Af Amer 51 (L) >60 mL/min    Comment: (NOTE) The eGFR has been calculated using the CKD EPI equation. This calculation has not been validated in all clinical situations. eGFR's persistently <60 mL/min signify possible Chronic Kidney Disease.    Anion gap 8 5 - 15  CBC with Differential/Platelet     Status: Abnormal   Collection Time: 08/04/17  1:27 PM  Result Value Ref Range   WBC 2.1 (L) 3.8 - 10.6 K/uL   RBC 3.95 (L) 4.40 - 5.90 MIL/uL   Hemoglobin 11.6 (L) 13.0 - 18.0 g/dL   HCT 33.3 (L) 40.0 - 52.0 %   MCV 84.2 80.0 - 100.0 fL   MCH 29.4 26.0 - 34.0 pg   MCHC 34.9 32.0 - 36.0 g/dL   RDW 15.2 (H) 11.5 - 14.5 %   Platelets 169 150 - 440 K/uL   Neutrophils Relative % 27 %   Neutro Abs 0.6 (L) 1.4 - 6.5 K/uL   Lymphocytes Relative 50 %   Lymphs Abs 1.1 1.0 - 3.6 K/uL   Monocytes Relative 15 %   Monocytes Absolute 0.3 0.2 - 1.0 K/uL   Eosinophils Relative 6 %   Eosinophils Absolute 0.1 0 - 0.7 K/uL   Basophils Relative 2 %   Basophils Absolute 0.0 0 - 0.1 K/uL  Lipase, blood     Status: None   Collection Time: 08/21/17  6:56 PM  Result Value Ref Range   Lipase 45 11 - 51 U/L  Comprehensive metabolic panel     Status: Abnormal   Collection Time: 08/21/17  6:56 PM  Result Value Ref Range   Sodium 141 135 - 145 mmol/L   Potassium 4.5 3.5 - 5.1 mmol/L   Chloride 104 101 - 111 mmol/L   CO2 29 22 - 32 mmol/L   Glucose, Bld 147 (H) 65 - 99 mg/dL   BUN 23 (H) 6 - 20 mg/dL   Creatinine, Ser 1.64 (H) 0.61 - 1.24 mg/dL   Calcium 9.3 8.9 -  10.3 mg/dL   Total Protein 7.7 6.5 - 8.1 g/dL   Albumin 4.5 3.5 - 5.0 g/dL   AST 15 15 - 41 U/L   ALT 14 (L) 17 - 63 U/L   Alkaline Phosphatase 89 38 - 126 U/L    Total Bilirubin 1.1 0.3 - 1.2 mg/dL   GFR calc non Af Amer 40 (L) >60 mL/min   GFR calc Af Amer 46 (L) >60 mL/min    Comment: (NOTE) The eGFR has been calculated using the CKD EPI equation. This calculation has not been validated in all clinical situations. eGFR's persistently <60 mL/min signify possible Chronic Kidney Disease.    Anion gap 8 5 - 15  CBC     Status: Abnormal   Collection Time: 08/21/17  6:56 PM  Result Value Ref Range   WBC 2.4 (L) 3.8 - 10.6 K/uL   RBC 4.49 4.40 - 5.90 MIL/uL   Hemoglobin 13.3 13.0 - 18.0 g/dL   HCT 38.1 (L) 40.0 - 52.0 %   MCV 84.8 80.0 - 100.0 fL   MCH 29.7 26.0 - 34.0 pg   MCHC 35.0 32.0 - 36.0 g/dL   RDW 16.0 (H) 11.5 - 14.5 %   Platelets 179 150 - 440 K/uL  Urinalysis, Complete w Microscopic     Status: Abnormal   Collection Time: 08/21/17  6:56 PM  Result Value Ref Range   Color, Urine YELLOW (A) YELLOW   APPearance CLEAR (A) CLEAR   Specific Gravity, Urine 1.012 1.005 - 1.030   pH 7.0 5.0 - 8.0   Glucose, UA NEGATIVE NEGATIVE mg/dL   Hgb urine dipstick NEGATIVE NEGATIVE   Bilirubin Urine NEGATIVE NEGATIVE   Ketones, ur NEGATIVE NEGATIVE mg/dL   Protein, ur 30 (A) NEGATIVE mg/dL   Nitrite NEGATIVE NEGATIVE   Leukocytes, UA NEGATIVE NEGATIVE   RBC / HPF 0-5 0 - 5 RBC/hpf   WBC, UA 0-5 0 - 5 WBC/hpf   Bacteria, UA NONE SEEN NONE SEEN   Squamous Epithelial / LPF 0-5 (A) NONE SEEN  Troponin I     Status: None   Collection Time: 08/21/17  6:56 PM  Result Value Ref Range   Troponin I <0.03 <0.03 ng/mL  CBC with Differential/Platelet     Status: Abnormal   Collection Time: 09/01/17  1:20 PM  Result Value Ref Range   WBC 1.5 (L) 3.8 - 10.6 K/uL   RBC 3.87 (L) 4.40 - 5.90 MIL/uL   Hemoglobin 11.5 (L) 13.0 - 18.0 g/dL   HCT 32.8 (L) 40.0 - 52.0 %   MCV 84.9 80.0 - 100.0 fL   MCH 29.9 26.0 - 34.0 pg   MCHC 35.2 32.0 - 36.0 g/dL   RDW 14.8 (H) 11.5 - 14.5 %   Platelets 146 (L) 150 - 440 K/uL   Neutrophils Relative % 8 %   Neutro  Abs 0.1 (L) 1.4 - 6.5 K/uL    Comment: RESULT REPEATED AND VERIFIED CRITICAL RESULT CALLED TO, READ BACK BY AND VERIFIED WITH: HEATHER JONES @ 1:43PM 09/01/2017 [facility]    Lymphocytes Relative 52 %   Lymphs Abs 0.8 (L) 1.0 - 3.6 K/uL   Monocytes Relative 31 %   Monocytes Absolute 0.5 0.2 - 1.0 K/uL   Eosinophils Relative 6 %   Eosinophils Absolute 0.1 0 - 0.7 K/uL   Basophils Relative 3 %   Basophils Absolute 0.0 0 - 0.1 K/uL  CBC with Differential/Platelet     Status: Abnormal   Collection Time: 09/29/17  1:49 PM  Result Value Ref Range   WBC 1.9 (L) 3.8 - 10.6 K/uL   RBC 4.15 (L) 4.40 - 5.90 MIL/uL   Hemoglobin 12.1 (L) 13.0 - 18.0 g/dL   HCT 34.7 (L) 40.0 - 52.0 %   MCV 83.6 80.0 - 100.0 fL   MCH 29.2 26.0 - 34.0 pg   MCHC 35.0 32.0 - 36.0 g/dL   RDW 15.3 (H) 11.5 - 14.5 %   Platelets 175 150 - 440 K/uL   Neutrophils Relative % 12 %   Neutro Abs 0.2 (L) 1.4 - 6.5 K/uL    Comment: RESULT REPEATED AND VERIFIED CRITICAL RESULT CALLED TO, READ BACK BY AND VERIFIED WITH: BROOKE THOMPSON AT 14:02 09/29/2017 [facility]    Lymphocytes Relative 59 %   Lymphs Abs 1.1 1.0 - 3.6 K/uL   Monocytes Relative 18 %   Monocytes Absolute 0.3 0.2 - 1.0 K/uL   Eosinophils Relative 8 %   Eosinophils Absolute 0.2 0 - 0.7 K/uL   Basophils Relative 3 %   Basophils Absolute 0.1 0 - 0.1 K/uL  Comprehensive metabolic panel     Status: Abnormal   Collection Time: 10/27/17  1:40 PM  Result Value Ref Range   Sodium 135 135 - 145 mmol/L   Potassium 3.7 3.5 - 5.1 mmol/L   Chloride 102 101 - 111 mmol/L   CO2 24 22 - 32 mmol/L   Glucose, Bld 139 (H) 65 - 99 mg/dL   BUN 23 (H) 6 - 20 mg/dL   Creatinine, Ser 1.52 (H) 0.61 - 1.24 mg/dL   Calcium 8.6 (L) 8.9 - 10.3 mg/dL   Total Protein 7.0 6.5 - 8.1 g/dL   Albumin 3.4 (L) 3.5 - 5.0 g/dL   AST 15 15 - 41 U/L   ALT 17 17 - 63 U/L   Alkaline Phosphatase 77 38 - 126 U/L   Total Bilirubin 0.7 0.3 - 1.2 mg/dL   GFR calc non Af Amer 43 (L) >60 mL/min   GFR calc Af  Amer 50 (L) >60 mL/min    Comment: (NOTE) The eGFR has been calculated using the CKD EPI equation. This calculation has not been validated in all clinical situations. eGFR's persistently <60 mL/min signify possible Chronic Kidney Disease.    Anion gap 9 5 - 15  CBC with Differential/Platelet     Status: Abnormal   Collection Time: 10/27/17  1:40 PM  Result Value Ref Range   WBC 1.3 (LL) 3.8 - 10.6 K/uL    Comment: RESULT REPEATED AND VERIFIED [facility] CRITICAL VALUE PROTOCOL    RBC 3.89 (L) 4.40 - 5.90 MIL/uL   Hemoglobin 11.3 (L) 13.0 - 18.0 g/dL   HCT 32.2 (L) 40.0 - 52.0 %   MCV 82.8 80.0 - 100.0 fL   MCH 29.1 26.0 - 34.0 pg   MCHC 35.2 32.0 - 36.0 g/dL   RDW 14.7 (H) 11.5 - 14.5 %   Platelets 171 150 - 440 K/uL   Neutrophils Relative % 22 %   Neutro Abs 0.3 (L) 1.4 - 6.5 K/uL    Comment: RESULT REPEATED AND VERIFIED CRITICAL RESULT CALLED TO, READ BACK BY AND VERIFIED WITH: HEATHER JONES AT 13:49 10/27/2017 [facility]    Lymphocytes Relative 50 %   Lymphs Abs 0.6 (L) 1.0 - 3.6 K/uL   Monocytes Relative 23 %   Monocytes Absolute 0.3 0.2 - 1.0 K/uL   Eosinophils Relative 5 %   Eosinophils Absolute 0.1 0 - 0.7 K/uL  Basophils Relative 0 %   Basophils Absolute 0.0 0 - 0.1 K/uL     STUDIES: No results found. 07/23/15 -  Bone Marrow Biopsy Report  -  nonspecific marrow findings with no diagnostic morphologic or immunophenotypic evidence of hematopoietic neoplasia. Mildly hypercellular marrow for age (40-50%) with myeloid hypoplasia, increased eosinophils, erythroid hyperplasia with mild nonspecific dyserythropoiesis and mild megakaryocytic atypia. Diffuse mild to focally moderate increase in reticulin. Storage iron present. Flow cytometry reports relatively decreased neutrophilic cells with no immunophenotypic abnormalities or increase in blasts, relatively increased eosinophils (17%) nonspecific. Cytogenetics normal 46XY. SNP microarray result is Normal Male.    ASSESSMENT  / PLAN:   Other neutropenia ([place]) 1. Leukopenia/Neutropenia  of unclear origin-as previously stated, extensive workup including multiple bone marrow biopsies failed to reveal clear etiology of persistent neutropenia.  Today white count is 1.3 absolute neutrophil count 300.  # continue Granix 480 mcg every other week; labs every 4 weeks. ANC 300. Given script for Levaquin.  Patient was asked to call us if he develops fevers; and then only per recommendation take Levaquin.    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 Stable. On PO iron.   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   #Heart murmur-no concerns for endocarditis.    Follow-up with me in 4 months- lab monthly/every 2 weeks Granix.  Cammie Sickle, MD   10/28/2017 3:06 AM

## 2017-10-27 NOTE — Progress Notes (Signed)
1340-Critical anc called by Selena BattenKim, in [facility] lab. anc 0.3. Dr. Marlis Edelsonbrahmandya informed 1400. Read back process performed with lab tech and md.  Pt informed of anc results today. Pt educated reinforced on neutropenic precautions.

## 2017-10-27 NOTE — Assessment & Plan Note (Addendum)
1. Leukopenia/Neutropenia  of unclear origin-as previously stated, extensive workup including multiple bone marrow biopsies failed to reveal clear etiology of persistent neutropenia.  Today white count is 1.3 absolute neutrophil count 300.  # continue Granix 480 mcg every other week; labs every 4 weeks. ANC 300. Given script for Levaquin.  Patient was asked to call us if he develops fevers; and then only per recommendation take Levaquin.    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 Stable. On PO iron.   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   #Heart murmur-no concerns for endocarditis.    Follow-up with me in 4 months- lab monthly/every 2 weeks Granix.

## 2017-11-10 ENCOUNTER — Inpatient Hospital Stay: Payer: Medicare HMO | Attending: Internal Medicine

## 2017-11-10 DIAGNOSIS — E1122 Type 2 diabetes mellitus with diabetic chronic kidney disease: Secondary | ICD-10-CM | POA: Insufficient documentation

## 2017-11-10 DIAGNOSIS — N183 Chronic kidney disease, stage 3 (moderate): Secondary | ICD-10-CM | POA: Diagnosis not present

## 2017-11-10 DIAGNOSIS — D708 Other neutropenia: Secondary | ICD-10-CM | POA: Insufficient documentation

## 2017-11-10 DIAGNOSIS — I129 Hypertensive chronic kidney disease with stage 1 through stage 4 chronic kidney disease, or unspecified chronic kidney disease: Secondary | ICD-10-CM | POA: Insufficient documentation

## 2017-11-10 DIAGNOSIS — D631 Anemia in chronic kidney disease: Secondary | ICD-10-CM | POA: Diagnosis not present

## 2017-11-10 MED ORDER — TBO-FILGRASTIM 480 MCG/0.8ML ~~LOC~~ SOSY
480.0000 ug | PREFILLED_SYRINGE | Freq: Once | SUBCUTANEOUS | Status: AC
  Administered 2017-11-10: 480 ug via SUBCUTANEOUS

## 2017-11-24 ENCOUNTER — Inpatient Hospital Stay (HOSPITAL_BASED_OUTPATIENT_CLINIC_OR_DEPARTMENT_OTHER): Payer: Medicare HMO | Admitting: Internal Medicine

## 2017-11-24 ENCOUNTER — Inpatient Hospital Stay: Payer: Medicare HMO

## 2017-11-24 DIAGNOSIS — D708 Other neutropenia: Secondary | ICD-10-CM | POA: Diagnosis not present

## 2017-11-24 LAB — COMPREHENSIVE METABOLIC PANEL
ALT: 16 U/L — AB (ref 17–63)
AST: 16 U/L (ref 15–41)
Albumin: 3.7 g/dL (ref 3.5–5.0)
Alkaline Phosphatase: 93 U/L (ref 38–126)
Anion gap: 8 (ref 5–15)
BUN: 26 mg/dL — AB (ref 6–20)
CHLORIDE: 102 mmol/L (ref 101–111)
CO2: 26 mmol/L (ref 22–32)
CREATININE: 1.42 mg/dL — AB (ref 0.61–1.24)
Calcium: 9 mg/dL (ref 8.9–10.3)
GFR calc Af Amer: 55 mL/min — ABNORMAL LOW (ref >60)
GFR, EST NON AFRICAN AMERICAN: 47 mL/min — AB (ref >60)
Glucose, Bld: 119 mg/dL — ABNORMAL HIGH (ref 65–99)
Potassium: 4.1 mmol/L (ref 3.5–5.1)
SODIUM: 136 mmol/L (ref 135–145)
Total Bilirubin: 0.7 mg/dL (ref 0.3–1.2)
Total Protein: 7.5 g/dL (ref 6.5–8.1)

## 2017-11-24 LAB — CBC WITH DIFFERENTIAL/PLATELET
Basophils Absolute: 0.1 10*3/uL (ref 0–0.1)
Basophils Relative: 3 %
EOS ABS: 0.3 10*3/uL (ref 0–0.7)
EOS PCT: 8 %
HCT: 34.2 % — ABNORMAL LOW (ref 40.0–52.0)
Hemoglobin: 11.8 g/dL — ABNORMAL LOW (ref 13.0–18.0)
LYMPHS ABS: 1.4 10*3/uL (ref 1.0–3.6)
Lymphocytes Relative: 38 %
MCH: 28.7 pg (ref 26.0–34.0)
MCHC: 34.5 g/dL (ref 32.0–36.0)
MCV: 83.1 fL (ref 80.0–100.0)
MONOS PCT: 9 %
Monocytes Absolute: 0.3 10*3/uL (ref 0.2–1.0)
Neutro Abs: 1.5 10*3/uL (ref 1.4–6.5)
Neutrophils Relative %: 42 %
PLATELETS: 209 10*3/uL (ref 150–440)
RBC: 4.11 MIL/uL — ABNORMAL LOW (ref 4.40–5.90)
RDW: 14.7 % — ABNORMAL HIGH (ref 11.5–14.5)
WBC: 3.7 10*3/uL — ABNORMAL LOW (ref 3.8–10.6)

## 2017-11-24 MED ORDER — TBO-FILGRASTIM 480 MCG/0.8ML ~~LOC~~ SOSY: 480.0000 ug | PREFILLED_SYRINGE | Freq: Once | SUBCUTANEOUS | Status: DC

## 2017-12-08 ENCOUNTER — Inpatient Hospital Stay: Payer: Medicare HMO | Attending: Internal Medicine

## 2017-12-08 DIAGNOSIS — D708 Other neutropenia: Secondary | ICD-10-CM | POA: Diagnosis not present

## 2017-12-08 DIAGNOSIS — D649 Anemia, unspecified: Secondary | ICD-10-CM | POA: Diagnosis not present

## 2017-12-08 MED ORDER — TBO-FILGRASTIM 480 MCG/0.8ML ~~LOC~~ SOSY
480.0000 ug | PREFILLED_SYRINGE | Freq: Once | SUBCUTANEOUS | Status: AC
  Administered 2017-12-08: 480 ug via SUBCUTANEOUS

## 2017-12-22 ENCOUNTER — Inpatient Hospital Stay: Payer: Medicare HMO

## 2017-12-22 DIAGNOSIS — D708 Other neutropenia: Secondary | ICD-10-CM

## 2017-12-22 LAB — CBC WITH DIFFERENTIAL/PLATELET
Basophils Absolute: 0.1 10*3/uL (ref 0–0.1)
Basophils Relative: 4 %
EOS ABS: 0.1 10*3/uL (ref 0–0.7)
EOS PCT: 4 %
HCT: 33.4 % — ABNORMAL LOW (ref 40.0–52.0)
Hemoglobin: 11.3 g/dL — ABNORMAL LOW (ref 13.0–18.0)
Lymphocytes Relative: 55 %
Lymphs Abs: 0.9 10*3/uL — ABNORMAL LOW (ref 1.0–3.6)
MCH: 28.3 pg (ref 26.0–34.0)
MCHC: 33.9 g/dL (ref 32.0–36.0)
MCV: 83.4 fL (ref 80.0–100.0)
MONO ABS: 0.3 10*3/uL (ref 0.2–1.0)
MONOS PCT: 21 %
NEUTROS PCT: 16 %
Neutro Abs: 0.3 10*3/uL — ABNORMAL LOW (ref 1.4–6.5)
PLATELETS: 210 10*3/uL (ref 150–440)
RBC: 4 MIL/uL — ABNORMAL LOW (ref 4.40–5.90)
RDW: 15.2 % — AB (ref 11.5–14.5)
WBC: 1.6 10*3/uL — ABNORMAL LOW (ref 3.8–10.6)

## 2017-12-22 MED ORDER — TBO-FILGRASTIM 480 MCG/0.8ML ~~LOC~~ SOSY
480.0000 ug | PREFILLED_SYRINGE | Freq: Once | SUBCUTANEOUS | Status: AC
  Administered 2017-12-22: 480 ug via SUBCUTANEOUS

## 2018-01-05 ENCOUNTER — Inpatient Hospital Stay: Payer: Medicare HMO

## 2018-01-05 DIAGNOSIS — D708 Other neutropenia: Secondary | ICD-10-CM

## 2018-01-05 MED ORDER — TBO-FILGRASTIM 480 MCG/0.8ML ~~LOC~~ SOSY
480.0000 ug | PREFILLED_SYRINGE | Freq: Once | SUBCUTANEOUS | Status: AC
  Administered 2018-01-05: 480 ug via SUBCUTANEOUS
  Filled 2018-01-05: qty 0.8

## 2018-01-19 ENCOUNTER — Inpatient Hospital Stay: Payer: Medicare HMO | Attending: Internal Medicine

## 2018-01-19 DIAGNOSIS — D709 Neutropenia, unspecified: Secondary | ICD-10-CM | POA: Diagnosis not present

## 2018-01-19 DIAGNOSIS — I2510 Atherosclerotic heart disease of native coronary artery without angina pectoris: Secondary | ICD-10-CM | POA: Diagnosis not present

## 2018-01-19 DIAGNOSIS — D708 Other neutropenia: Secondary | ICD-10-CM

## 2018-01-19 DIAGNOSIS — Z803 Family history of malignant neoplasm of breast: Secondary | ICD-10-CM | POA: Insufficient documentation

## 2018-01-19 DIAGNOSIS — E1122 Type 2 diabetes mellitus with diabetic chronic kidney disease: Secondary | ICD-10-CM | POA: Insufficient documentation

## 2018-01-19 DIAGNOSIS — D649 Anemia, unspecified: Secondary | ICD-10-CM | POA: Diagnosis not present

## 2018-01-19 DIAGNOSIS — I129 Hypertensive chronic kidney disease with stage 1 through stage 4 chronic kidney disease, or unspecified chronic kidney disease: Secondary | ICD-10-CM | POA: Diagnosis not present

## 2018-01-19 DIAGNOSIS — Z79899 Other long term (current) drug therapy: Secondary | ICD-10-CM | POA: Diagnosis not present

## 2018-01-19 DIAGNOSIS — E1151 Type 2 diabetes mellitus with diabetic peripheral angiopathy without gangrene: Secondary | ICD-10-CM | POA: Diagnosis not present

## 2018-01-19 DIAGNOSIS — N183 Chronic kidney disease, stage 3 (moderate): Secondary | ICD-10-CM | POA: Insufficient documentation

## 2018-01-19 DIAGNOSIS — R011 Cardiac murmur, unspecified: Secondary | ICD-10-CM | POA: Insufficient documentation

## 2018-01-19 DIAGNOSIS — G4730 Sleep apnea, unspecified: Secondary | ICD-10-CM | POA: Diagnosis not present

## 2018-01-19 DIAGNOSIS — Z7902 Long term (current) use of antithrombotics/antiplatelets: Secondary | ICD-10-CM | POA: Insufficient documentation

## 2018-01-19 DIAGNOSIS — Z7982 Long term (current) use of aspirin: Secondary | ICD-10-CM | POA: Insufficient documentation

## 2018-01-19 MED ORDER — TBO-FILGRASTIM 480 MCG/0.8ML ~~LOC~~ SOSY
480.0000 ug | PREFILLED_SYRINGE | Freq: Once | SUBCUTANEOUS | Status: AC
  Administered 2018-01-19: 480 ug via SUBCUTANEOUS

## 2018-01-26 ENCOUNTER — Ambulatory Visit: Payer: Medicare HMO | Admitting: Internal Medicine

## 2018-01-26 ENCOUNTER — Ambulatory Visit: Payer: Medicare HMO

## 2018-01-26 ENCOUNTER — Other Ambulatory Visit: Payer: Medicare HMO

## 2018-02-02 ENCOUNTER — Encounter: Payer: Self-pay | Admitting: Internal Medicine

## 2018-02-02 ENCOUNTER — Inpatient Hospital Stay: Payer: Medicare HMO

## 2018-02-02 ENCOUNTER — Inpatient Hospital Stay (HOSPITAL_BASED_OUTPATIENT_CLINIC_OR_DEPARTMENT_OTHER): Payer: Medicare HMO | Admitting: Internal Medicine

## 2018-02-02 VITALS — BP 155/70 | HR 53 | Temp 98.3°F | Resp 16 | Wt 212.4 lb

## 2018-02-02 DIAGNOSIS — I129 Hypertensive chronic kidney disease with stage 1 through stage 4 chronic kidney disease, or unspecified chronic kidney disease: Secondary | ICD-10-CM

## 2018-02-02 DIAGNOSIS — I2510 Atherosclerotic heart disease of native coronary artery without angina pectoris: Secondary | ICD-10-CM | POA: Diagnosis not present

## 2018-02-02 DIAGNOSIS — D649 Anemia, unspecified: Secondary | ICD-10-CM

## 2018-02-02 DIAGNOSIS — D708 Other neutropenia: Secondary | ICD-10-CM

## 2018-02-02 DIAGNOSIS — Z7982 Long term (current) use of aspirin: Secondary | ICD-10-CM

## 2018-02-02 DIAGNOSIS — D709 Neutropenia, unspecified: Secondary | ICD-10-CM

## 2018-02-02 DIAGNOSIS — Z79899 Other long term (current) drug therapy: Secondary | ICD-10-CM | POA: Diagnosis not present

## 2018-02-02 DIAGNOSIS — N183 Chronic kidney disease, stage 3 (moderate): Secondary | ICD-10-CM

## 2018-02-02 DIAGNOSIS — Z7902 Long term (current) use of antithrombotics/antiplatelets: Secondary | ICD-10-CM | POA: Diagnosis not present

## 2018-02-02 DIAGNOSIS — E1122 Type 2 diabetes mellitus with diabetic chronic kidney disease: Secondary | ICD-10-CM

## 2018-02-02 DIAGNOSIS — R011 Cardiac murmur, unspecified: Secondary | ICD-10-CM | POA: Diagnosis not present

## 2018-02-02 DIAGNOSIS — Z803 Family history of malignant neoplasm of breast: Secondary | ICD-10-CM

## 2018-02-02 DIAGNOSIS — E1151 Type 2 diabetes mellitus with diabetic peripheral angiopathy without gangrene: Secondary | ICD-10-CM

## 2018-02-02 DIAGNOSIS — G4730 Sleep apnea, unspecified: Secondary | ICD-10-CM

## 2018-02-02 LAB — CBC WITH DIFFERENTIAL/PLATELET
Basophils Absolute: 0.1 10*3/uL (ref 0–0.1)
Basophils Relative: 4 %
EOS ABS: 0.2 10*3/uL (ref 0–0.7)
EOS PCT: 7 %
HCT: 34.6 % — ABNORMAL LOW (ref 40.0–52.0)
Hemoglobin: 12.2 g/dL — ABNORMAL LOW (ref 13.0–18.0)
LYMPHS ABS: 1.1 10*3/uL (ref 1.0–3.6)
Lymphocytes Relative: 53 %
MCH: 30 pg (ref 26.0–34.0)
MCHC: 35.2 g/dL (ref 32.0–36.0)
MCV: 85.4 fL (ref 80.0–100.0)
MONO ABS: 0.3 10*3/uL (ref 0.2–1.0)
Monocytes Relative: 14 %
Neutro Abs: 0.5 10*3/uL — ABNORMAL LOW (ref 1.4–6.5)
Neutrophils Relative %: 22 %
PLATELETS: 196 10*3/uL (ref 150–440)
RBC: 4.05 MIL/uL — AB (ref 4.40–5.90)
RDW: 16.1 % — ABNORMAL HIGH (ref 11.5–14.5)
WBC: 2.1 10*3/uL — AB (ref 3.8–10.6)

## 2018-02-02 MED ORDER — TBO-FILGRASTIM 480 MCG/0.8ML ~~LOC~~ SOSY
480.0000 ug | PREFILLED_SYRINGE | Freq: Once | SUBCUTANEOUS | Status: AC
  Administered 2018-02-02: 480 ug via SUBCUTANEOUS
  Filled 2018-02-02: qty 0.8

## 2018-02-02 NOTE — Assessment & Plan Note (Addendum)
1. Leukopenia/Neutropenia  of unclear origin-as previously stated, extensive workup including multiple bone marrow biopsies failed to reveal clear etiology of persistent neutropenia.  Today white count is 2.1 absolute neutrophil count 500.  # continue Granix 480 mcg every other week;  Patient was asked to call us if he develops fevers; and then only per recommendation take Levaquin.    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 Stable. On PO iron.   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   #Heart murmur-no concerns for endocarditis.    Follow-up with me in 4 months- lab monthly/every 2 weeks Granix.

## 2018-02-02 NOTE — Progress Notes (Signed)
[place]  [phone]) [phone] [phone]) [phone]     ID: Brandon Wilkerson OB: 02-Dec-1941  MR#: [id number]  CSN#:[id number]  Patient Care Team: Jodi Marble, MD as PCP - General (Internal Medicine)  CHIEF COMPLAINT/DIAGNOSIS:  Leukopenia/Neutropenia, Mild Anemia - Mild, asymptomatic, of unclear etiology. Workup so far unremarkable as detailed below.  Bone marrow biopsy 05/10/13 - no diagnostic morphologic evidence of B-cell lymphoproliferative disorder or other hematopoietic neoplasia identified.  Normocellular to slightly hypercellular marrow for age 58-50% with maturing trilineage hematopoiesis, storage iron present, slight patchy increase in reticulin  Flow study negative for any B-cell monoclonality, nonspecific myeloid and normocytic findings with no increase in blasts,relatively increased eosinophils 7% is nonspecific,relatively increased normal B-cell precursors (hematogones)and.  Normal male karyotype, 46XY.  Labs done on 11/02/12 - Hb 13.3, platelets 172K, retic 0.036, WBC 3400 with 38% neutrophils, 35% lymphocytes, 10% variant lymphocytes, 12% monocytes. Iron study, B12, folate, ANA, serum immunoelectrophoresis (SIEP), PT, PTT, LDH, haptoglobin, Coombs test, HBsAg, HCV antibody, and HIV antibody all unremarkable.   05/03/13 - Peripheral blood Flow Cytometry. A very small (0.3% of leukocytes) B cell clone is detected. Previous phenotyping results showed similar findings. The clone is very small and is not diagnostic of any specific type of lymphoma. If the patient has a previous history of lymphoma, the finding is consistent with persistent involvement by lymphoma. 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  07/23/15 -  Bone Marrow Biopsy Report  -  nonspecific marrow findings with no diagnostic morphologic or immunophenotypic evidence of hematopoietic neoplasia. Mildly hypercellular marrow for age (40-50%) with myeloid hypoplasia,  increased eosinophils, erythroid hyperplasia with mild nonspecific dyserythropoiesis and mild megakaryocytic atypia. Diffuse mild to focally moderate increase in reticulin. Storage iron present. Flow cytometry reports relatively decreased neutrophilic cells with no immunophenotypic abnormalities or increase in blasts, relatively increased eosinophils (17%) nonspecific. Cytogenetics normal 46XY. SNP microarray result is Normal Male. The treatment with short course of steroids was attempted in November 2016, however there was no response. Treatment with G-CSF was initiated in early December 2016  HISTORY OF PRESENT ILLNESS:   77 year old male patient with a history of neutropenia of unclear etiology/ currently on granix every 2 weeks is here for follow-up.  Patient denies any new fevers or chills.  Appetite is good.  No weight loss.  No recent infections or hospitalizations.  He continues to travel a lot.  REVIEW  OF SYSTEMS:   ROS: As in HPI above.  PAST MEDICAL HISTORY: Reviewed.         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 As above.  FAMILY HISTORY: Reviewed. Remarkable for diabetes, heart disease, hypertension, breast cancer.   SOCIAL HISTORY: Reviewed. Nonsmoker.  Denies alcohol or recreational drug usage.    Allergies  Allergen Reactions  . Pollen Extract Other (See Comments)    Runny nose    Current Outpatient Medications  Medication Sig Dispense Refill  . amLODipine (NORVASC) 10 MG tablet Take 10 mg by mouth daily.    Marland Kitchen aspirin 81 MG tablet Take 81 mg by mouth daily.    Marland Kitchen atorvastatin (LIPITOR) 80 MG tablet Take 80 mg by mouth daily.    . carvedilol (COREG) 6.25 MG tablet Take 12.5 mg by mouth 2 (two) times daily with a meal.     .  cloNIDine (CATAPRES) 0.3 MG tablet  Take 0.3 mg by mouth 2 (two) times daily.    . clopidogrel (PLAVIX) 75 MG tablet Take 75 mg by mouth daily.    . ferrous sulfate 325 (65 FE) MG EC tablet Take 325 mg by mouth daily.    . fluticasone (FLONASE) 50 MCG/ACT nasal spray Place 1 spray into both nostrils daily.    Marland Kitchen gabapentin (NEURONTIN) 300 MG capsule Take 1 capsule (300 mg total) by mouth 3 (three) times daily. (Patient taking differently: Take 600 mg by mouth 2 (two) times daily. ) 90 capsule 0  . glipiZIDE (GLUCOTROL) 5 MG tablet Take 2.5 mg by mouth 2 (two) times daily.    . isosorbide mononitrate (IMDUR) 60 MG 24 hr tablet Take 60 mg by mouth daily.    Marland Kitchen JARDIANCE 25 MG TABS tablet     . nitroGLYCERIN (NITROSTAT) 0.4 MG SL tablet Place 0.4 mg under the tongue every 5 (five) minutes as needed for chest pain.    . ONE TOUCH ULTRA TEST test strip use as directed;  twice a day OR MORE if needed  1  . polyethylene glycol (MIRALAX) packet Take 17 g by mouth daily. 14 each 0  . Vitamin D, Ergocalciferol, (DRISDOL) 50000 units CAPS capsule Take 50,000 Units by mouth every 30 (thirty) days.  3  . levofloxacin (LEVAQUIN) 500 MG tablet Take 1 tablet (500 mg total) by mouth daily. Call MD before taking the anti-biotic (Patient not taking: Reported on 02/02/2018) 7 tablet 0   No current facility-administered medications for this visit.    Facility-Administered Medications Ordered in Other Visits  Medication Dose Route Frequency Provider Last Rate Last Dose  . Tbo-Filgrastim (GRANIX) injection 480 mcg  480 mcg Subcutaneous Once Cammie Sickle, MD        PHYSICAL EXAM: Vitals:   02/02/18 1350  BP: (!) 155/70  Pulse: (!) 53  Resp: 16  Temp: 98.3 F (36.8 C)     Body mass index is 27.64 kg/m.     BP (!) 155/70 (BP Location: Left Arm, Patient Position: Sitting)   Pulse (!) 53   Temp 98.3 F (36.8 C) (Tympanic)   Resp 16   Wt 212 lb 6.4 oz (96.3 kg)   BMI 27.64 kg/m   General Appearance:    Alert, cooperative, no  distress, appears stated age;  accompanied by his wife   Head:    Normocephalic, without obvious abnormality, atraumatic  Eyes:    PERRL, conjunctiva/corneas clear, EOM's intact, fundi    benign, both eyes       Ears:    Normal TM's and external ear canals, both ears  Nose:   Nares normal, septum midline, mucosa normal, no drainage   or sinus tenderness  Throat:   Lips, mucosa, and tongue normal; teeth and gums normal  Neck:   Supple, symmetrical, trachea midline, no adenopathy;       thyroid:  No enlargement/tenderness/nodules; carotid   bruit on L  Back:     Symmetric, no curvature, ROM normal, no CVA tenderness  Lungs:     Clear to auscultation bilaterally, respirations unlabored  Chest wall:    No tenderness or deformity  Heart:    Regular rate and rhythm, S1 and S2 normal, systolic ejection murmur 3 out of 6 on aorta   Abdomen:     Soft, non-tender, bowel sounds active all four quadrants,    no masses, no organomegaly  Extremities:   Extremities normal, atraumatic,  no cyanosis or edema  Pulses:   2+ and symmetric all extremities  Skin:   Skin color, texture, turgor normal, no rashes or lesions  Lymph nodes:   Cervical, supraclavicular, and axillary nodes normal  Neurologic:   CNII-XII intact. Normal strength, sensation and reflexes      throughout     LAB RESULTS: Recent Results (from the past 2160 hour(s))  Comprehensive metabolic panel     Status: Abnormal   Collection Time: 11/24/17  1:12 PM  Result Value Ref Range   Sodium 136 135 - 145 mmol/L   Potassium 4.1 3.5 - 5.1 mmol/L   Chloride 102 101 - 111 mmol/L   CO2 26 22 - 32 mmol/L   Glucose, Bld 119 (H) 65 - 99 mg/dL   BUN 26 (H) 6 - 20 mg/dL   Creatinine, Ser 1.42 (H) 0.61 - 1.24 mg/dL   Calcium 9.0 8.9 - 10.3 mg/dL   Total Protein 7.5 6.5 - 8.1 g/dL   Albumin 3.7 3.5 - 5.0 g/dL   AST 16 15 - 41 U/L   ALT 16 (L) 17 - 63 U/L   Alkaline Phosphatase 93 38 - 126 U/L   Total Bilirubin 0.7 0.3 - 1.2 mg/dL   GFR calc  non Af Amer 47 (L) >60 mL/min   GFR calc Af Amer 55 (L) >60 mL/min    Comment: (NOTE) The eGFR has been calculated using the CKD EPI equation. This calculation has not been validated in all clinical situations. eGFR's persistently <60 mL/min signify possible Chronic Kidney Disease.    Anion gap 8 5 - 15  CBC with Differential/Platelet     Status: Abnormal   Collection Time: 11/24/17  1:12 PM  Result Value Ref Range   WBC 3.7 (L) 3.8 - 10.6 K/uL   RBC 4.11 (L) 4.40 - 5.90 MIL/uL   Hemoglobin 11.8 (L) 13.0 - 18.0 g/dL   HCT 34.2 (L) 40.0 - 52.0 %   MCV 83.1 80.0 - 100.0 fL   MCH 28.7 26.0 - 34.0 pg   MCHC 34.5 32.0 - 36.0 g/dL   RDW 14.7 (H) 11.5 - 14.5 %   Platelets 209 150 - 440 K/uL   Neutrophils Relative % 42 %   Neutro Abs 1.5 1.4 - 6.5 K/uL   Lymphocytes Relative 38 %   Lymphs Abs 1.4 1.0 - 3.6 K/uL   Monocytes Relative 9 %   Monocytes Absolute 0.3 0.2 - 1.0 K/uL   Eosinophils Relative 8 %   Eosinophils Absolute 0.3 0 - 0.7 K/uL   Basophils Relative 3 %   Basophils Absolute 0.1 0 - 0.1 K/uL  CBC with Differential/Platelet     Status: Abnormal   Collection Time: 12/22/17  1:05 PM  Result Value Ref Range   WBC 1.6 (L) 3.8 - 10.6 K/uL   RBC 4.00 (L) 4.40 - 5.90 MIL/uL   Hemoglobin 11.3 (L) 13.0 - 18.0 g/dL   HCT 33.4 (L) 40.0 - 52.0 %   MCV 83.4 80.0 - 100.0 fL   MCH 28.3 26.0 - 34.0 pg   MCHC 33.9 32.0 - 36.0 g/dL   RDW 15.2 (H) 11.5 - 14.5 %   Platelets 210 150 - 440 K/uL   Neutrophils Relative % 16 %   Neutro Abs 0.3 (L) 1.4 - 6.5 K/uL    Comment: RESULT REPEATED AND VERIFIED CRITICAL RESULT CALLED TO, READ BACK BY AND VERIFIED WITH: HEATHER JONES @ 1:22 PM 12/22/2017 [facility]    Lymphocytes Relative 55 %  Lymphs Abs 0.9 (L) 1.0 - 3.6 K/uL   Monocytes Relative 21 %   Monocytes Absolute 0.3 0.2 - 1.0 K/uL   Eosinophils Relative 4 %   Eosinophils Absolute 0.1 0 - 0.7 K/uL   Basophils Relative 4 %   Basophils Absolute 0.1 0 - 0.1 K/uL    Comment: Performed at [facility], [address] [place], [place] [address]  CBC with Differential/Platelet     Status: Abnormal   Collection Time: 02/02/18  1:15 PM  Result Value Ref Range   WBC 2.1 (L) 3.8 - 10.6 K/uL   RBC 4.05 (L) 4.40 - 5.90 MIL/uL   Hemoglobin 12.2 (L) 13.0 - 18.0 g/dL   HCT 34.6 (L) 40.0 - 52.0 %   MCV 85.4 80.0 - 100.0 fL   MCH 30.0 26.0 - 34.0 pg   MCHC 35.2 32.0 - 36.0 g/dL   RDW 16.1 (H) 11.5 - 14.5 %   Platelets 196 150 - 440 K/uL   Neutrophils Relative % 22 %   Neutro Abs 0.5 (L) 1.4 - 6.5 K/uL   Lymphocytes Relative 53 %   Lymphs Abs 1.1 1.0 - 3.6 K/uL   Monocytes Relative 14 %   Monocytes Absolute 0.3 0.2 - 1.0 K/uL   Eosinophils Relative 7 %   Eosinophils Absolute 0.2 0 - 0.7 K/uL   Basophils Relative 4 %   Basophils Absolute 0.1 0 - 0.1 K/uL    Comment: Performed at [facility], [address] [place], [place] [address]     STUDIES: No results found. 07/23/15 -  Bone Marrow Biopsy Report  -  nonspecific marrow findings with no diagnostic morphologic or immunophenotypic evidence of hematopoietic neoplasia. Mildly hypercellular marrow for age (40-50%) with myeloid hypoplasia, increased eosinophils, erythroid hyperplasia with mild nonspecific dyserythropoiesis and mild megakaryocytic atypia. Diffuse mild to focally moderate increase in reticulin. Storage iron present. Flow cytometry reports relatively decreased neutrophilic cells with no immunophenotypic abnormalities or increase in blasts, relatively increased eosinophils (17%) nonspecific. Cytogenetics normal 46XY. SNP microarray result is Normal Male.    ASSESSMENT / PLAN:   Other neutropenia ([place]) 1. Leukopenia/Neutropenia  of unclear origin-as previously stated, extensive workup including multiple bone marrow biopsies failed to reveal clear etiology of persistent neutropenia.  Today white count is 2.1 absolute neutrophil count 500.  # continue Granix 480 mcg every other week;  Patient was asked to call  us if he develops fevers; and then only per recommendation take Levaquin.    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 Stable. On PO iron.   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   #Heart murmur-no concerns for endocarditis.    Follow-up with me in 4 months- lab monthly/every 2 weeks Granix.  Cammie Sickle, MD   02/02/2018 3:09 PM

## 2018-02-02 NOTE — Progress Notes (Signed)
x

## 2018-02-16 ENCOUNTER — Inpatient Hospital Stay: Payer: Medicare HMO | Attending: Internal Medicine

## 2018-02-16 DIAGNOSIS — D708 Other neutropenia: Secondary | ICD-10-CM | POA: Diagnosis present

## 2018-02-16 MED ORDER — TBO-FILGRASTIM 480 MCG/0.8ML ~~LOC~~ SOSY
480.0000 ug | PREFILLED_SYRINGE | Freq: Once | SUBCUTANEOUS | Status: AC
  Administered 2018-02-16: 480 ug via SUBCUTANEOUS
  Filled 2018-02-16: qty 0.8

## 2018-02-22 NOTE — Progress Notes (Signed)
Patient not seen in the clinic; patient just received Granix injection/labs.

## 2018-03-02 ENCOUNTER — Inpatient Hospital Stay: Payer: Medicare HMO

## 2018-03-02 DIAGNOSIS — D708 Other neutropenia: Secondary | ICD-10-CM

## 2018-03-02 LAB — CBC WITH DIFFERENTIAL/PLATELET
BASOS ABS: 0.2 10*3/uL — AB (ref 0–0.1)
Basophils Relative: 5 %
Eosinophils Absolute: 0.2 10*3/uL (ref 0–0.7)
Eosinophils Relative: 6 %
HEMATOCRIT: 35.5 % — AB (ref 40.0–52.0)
Hemoglobin: 12.3 g/dL — ABNORMAL LOW (ref 13.0–18.0)
LYMPHS ABS: 1.3 10*3/uL (ref 1.0–3.6)
Lymphocytes Relative: 38 %
MCH: 29.6 pg (ref 26.0–34.0)
MCHC: 34.7 g/dL (ref 32.0–36.0)
MCV: 85.4 fL (ref 80.0–100.0)
Monocytes Absolute: 0.3 10*3/uL (ref 0.2–1.0)
Monocytes Relative: 9 %
NEUTROS ABS: 1.5 10*3/uL (ref 1.4–6.5)
Neutrophils Relative %: 42 %
Platelets: 238 10*3/uL (ref 150–440)
RBC: 4.16 MIL/uL — AB (ref 4.40–5.90)
RDW: 15.5 % — ABNORMAL HIGH (ref 11.5–14.5)
WBC: 3.6 10*3/uL — AB (ref 3.8–10.6)

## 2018-03-02 MED ORDER — TBO-FILGRASTIM 480 MCG/0.8ML ~~LOC~~ SOSY
480.0000 ug | PREFILLED_SYRINGE | Freq: Once | SUBCUTANEOUS | Status: AC
  Filled 2018-03-02: qty 0.8

## 2018-03-16 ENCOUNTER — Inpatient Hospital Stay: Payer: Medicare HMO | Attending: Internal Medicine

## 2018-03-16 DIAGNOSIS — D708 Other neutropenia: Secondary | ICD-10-CM | POA: Diagnosis present

## 2018-03-16 MED ORDER — TBO-FILGRASTIM 480 MCG/0.8ML ~~LOC~~ SOSY
480.0000 ug | PREFILLED_SYRINGE | Freq: Once | SUBCUTANEOUS | Status: AC
  Administered 2018-03-16: 480 ug via SUBCUTANEOUS

## 2018-03-30 ENCOUNTER — Inpatient Hospital Stay: Payer: Medicare HMO

## 2018-03-30 DIAGNOSIS — D708 Other neutropenia: Secondary | ICD-10-CM

## 2018-03-30 LAB — CBC WITH DIFFERENTIAL/PLATELET
Basophils Absolute: 0.1 10*3/uL (ref 0–0.1)
Basophils Relative: 5 %
Eosinophils Absolute: 0.1 10*3/uL (ref 0–0.7)
Eosinophils Relative: 6 %
HCT: 34.3 % — ABNORMAL LOW (ref 40.0–52.0)
Hemoglobin: 12 g/dL — ABNORMAL LOW (ref 13.0–18.0)
LYMPHS ABS: 1 10*3/uL (ref 1.0–3.6)
LYMPHS PCT: 61 %
MCH: 29.6 pg (ref 26.0–34.0)
MCHC: 34.9 g/dL (ref 32.0–36.0)
MCV: 84.9 fL (ref 80.0–100.0)
Monocytes Absolute: 0.3 10*3/uL (ref 0.2–1.0)
Monocytes Relative: 16 %
NEUTROS PCT: 12 %
Neutro Abs: 0.2 10*3/uL — ABNORMAL LOW (ref 1.4–6.5)
Platelets: 166 10*3/uL (ref 150–440)
RBC: 4.04 MIL/uL — AB (ref 4.40–5.90)
RDW: 15.3 % — ABNORMAL HIGH (ref 11.5–14.5)
WBC: 1.6 10*3/uL — AB (ref 3.8–10.6)

## 2018-03-30 MED ORDER — TBO-FILGRASTIM 480 MCG/0.8ML ~~LOC~~ SOSY
480.0000 ug | PREFILLED_SYRINGE | Freq: Once | SUBCUTANEOUS | Status: AC
  Administered 2018-03-30: 480 ug via SUBCUTANEOUS

## 2018-04-13 ENCOUNTER — Inpatient Hospital Stay: Payer: Medicare HMO | Attending: Internal Medicine

## 2018-04-13 DIAGNOSIS — D708 Other neutropenia: Secondary | ICD-10-CM | POA: Diagnosis not present

## 2018-04-13 MED ORDER — TBO-FILGRASTIM 480 MCG/0.8ML ~~LOC~~ SOSY
480.0000 ug | PREFILLED_SYRINGE | Freq: Once | SUBCUTANEOUS | Status: AC
  Administered 2018-04-13: 480 ug via SUBCUTANEOUS

## 2018-04-20 IMAGING — MR MR MRA NECK W/O CM
1 series · 42 of 48 positions shown · non-contrast
Comparison: MR brain 04/26/2016. Carotid duplex examination
04/26/2016.

CLINICAL DATA: 74-year-old hypertensive diabetic male with slurred
speech and ataxia. Subsequent encounter.

EXAM:
MRA NECK WITHOUT CONTRAST
TECHNIQUE: Multiplanar and multiecho pulse sequences of the neck were obtained
without intravenous contrast. Angiographic images of the neck were
obtained using MRA technique without and with intravenous contrast.

[Series 3: (id) · axial · 1.0mm · 0.52mm/px · z∈[-54,+56]mm · 42 of 116 slices shown]
[im 1/116]
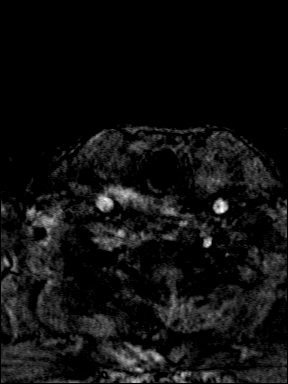
[im 3/116]
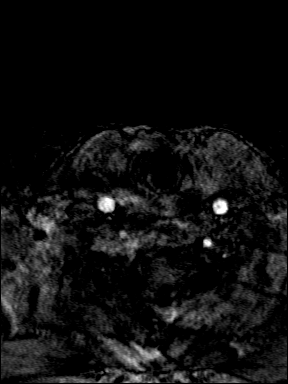
[im 5/116]
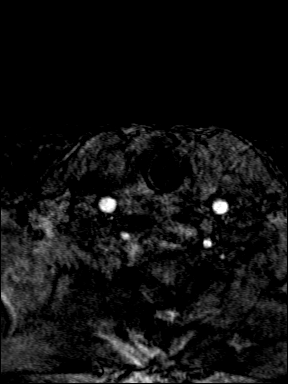
[im 8/116]
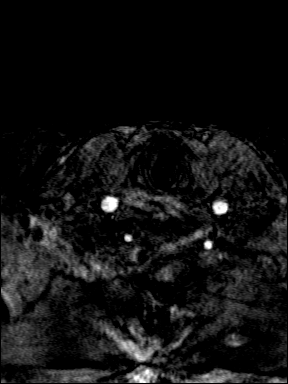
[im 10/116]
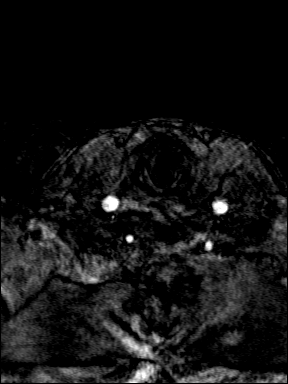
[im 13/116]
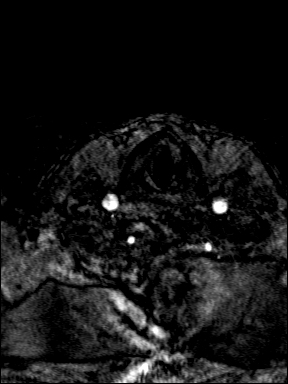
[im 15/116]
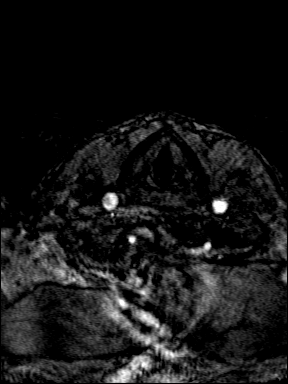
[im 18/116]
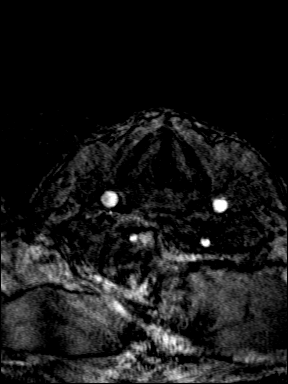
[im 20/116]
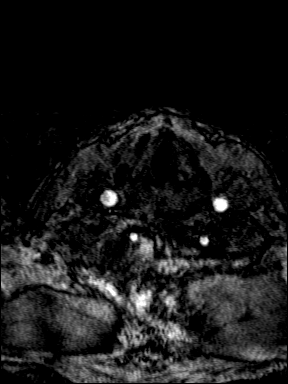
[im 23/116]
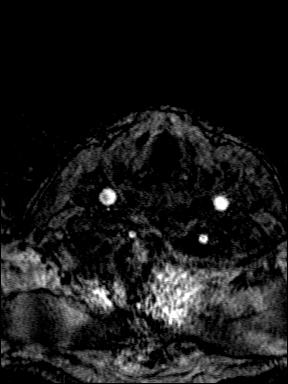
[im 25/116]
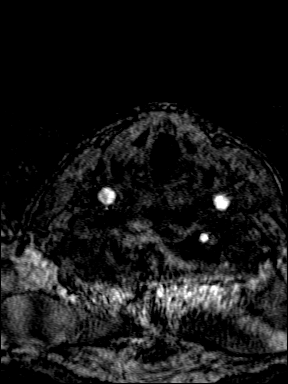
[im 27/116]
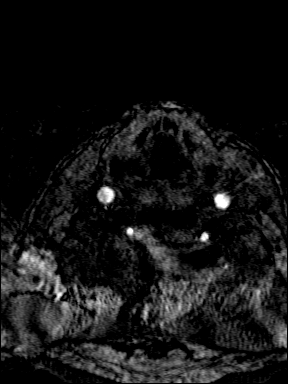
[im 30/116]
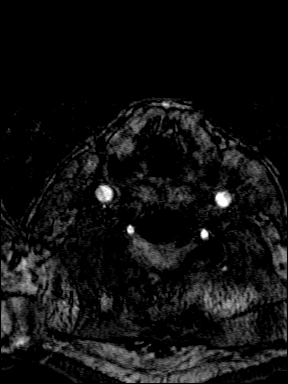
[im 32/116]
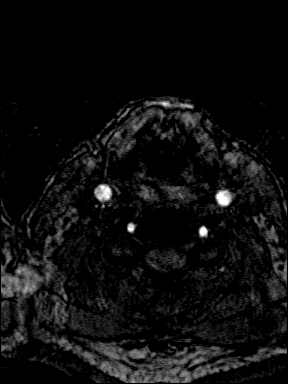
[im 35/116]
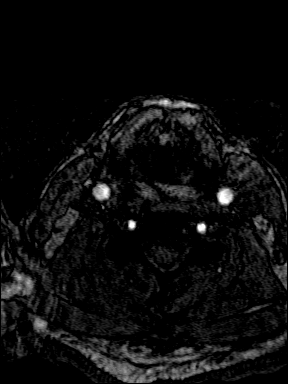
[im 37/116]
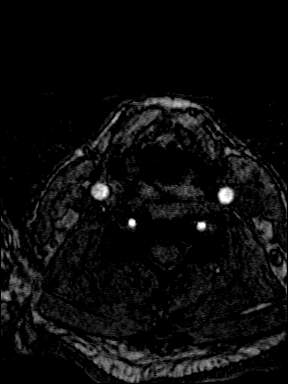
[im 40/116]
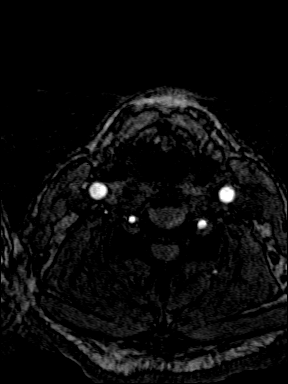
[im 42/116]
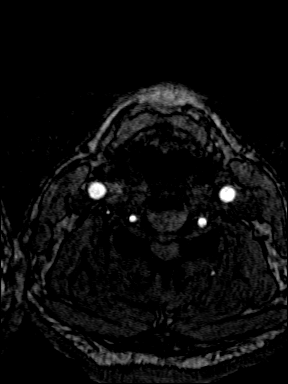
[im 45/116]
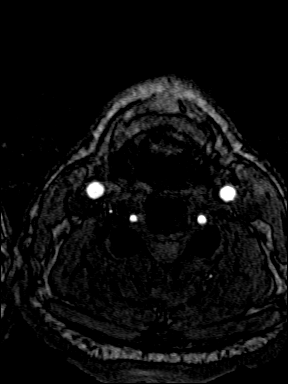
[im 47/116]
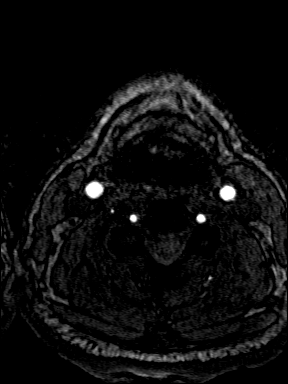
[im 49/116]
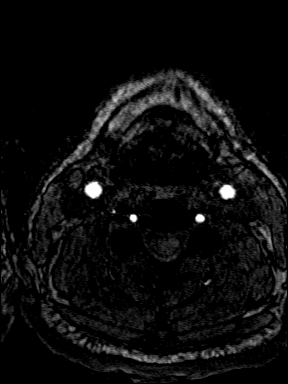
[im 52/116]
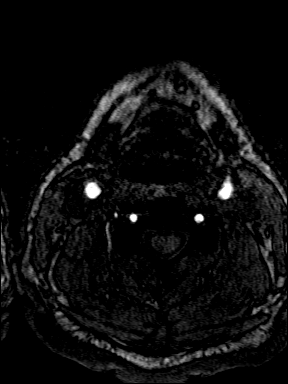
[im 54/116]
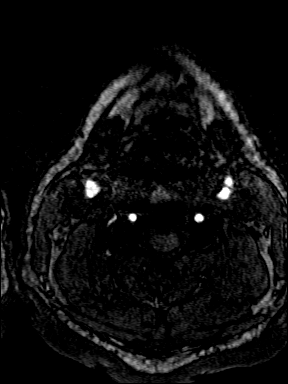
[im 57/116]
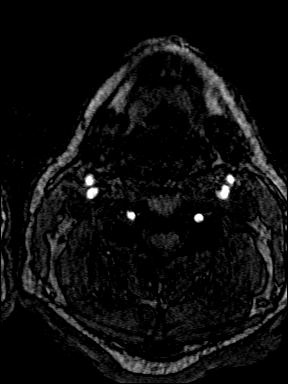
[im 59/116]
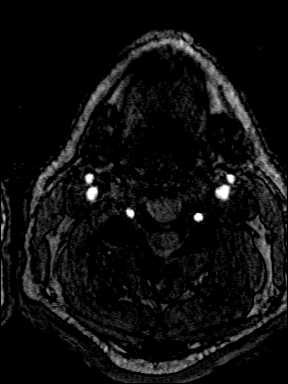
[im 62/116]
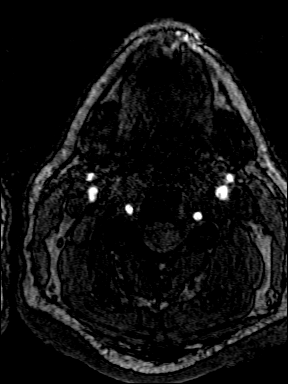
[im 64/116]
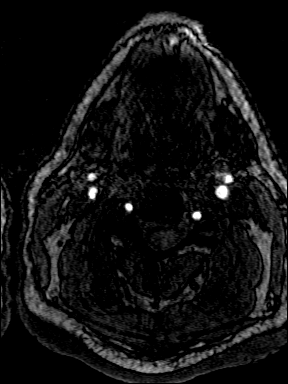
[im 67/116]
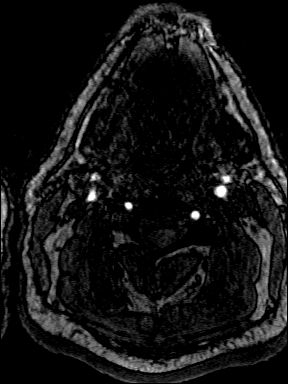
[im 69/116]
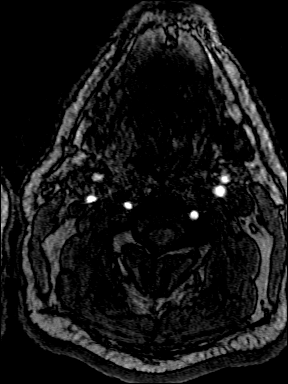
[im 71/116]
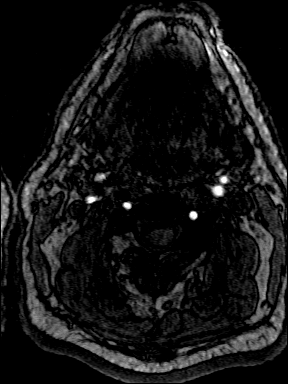
[im 74/116]
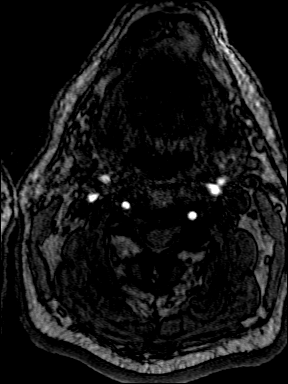
[im 76/116]
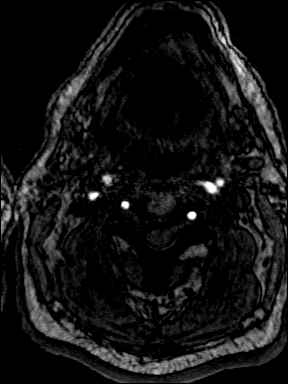
[im 79/116]
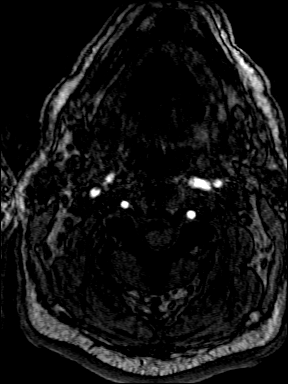
[im 81/116]
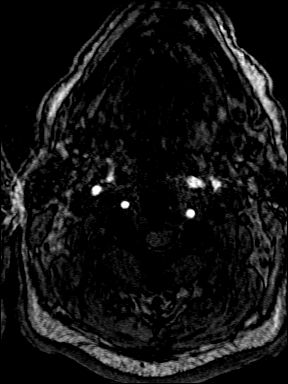
[im 84/116]
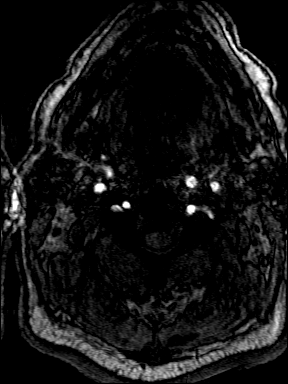
[im 86/116]
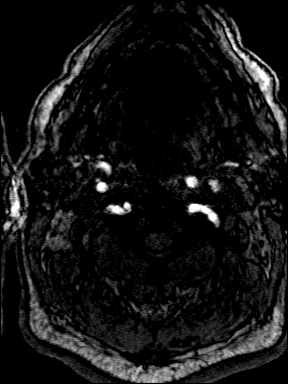
[im 89/116]
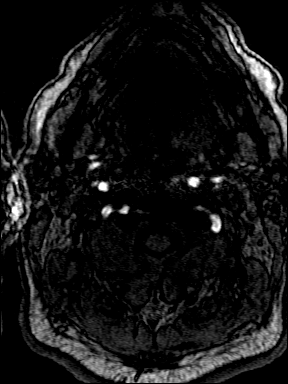
[im 91/116]
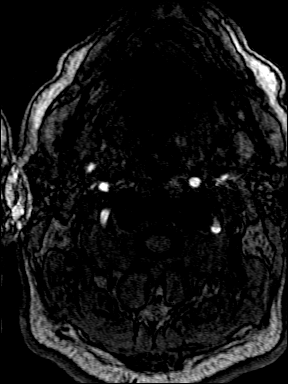
[im 93/116]
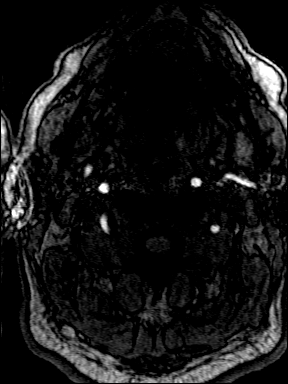
[im 96/116]
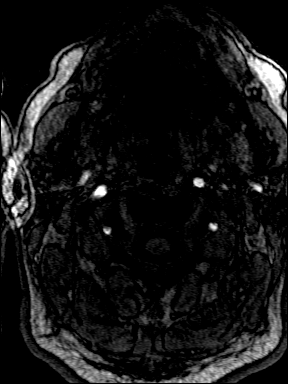
[im 98/116]
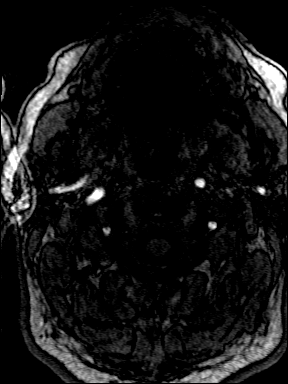
[im 111/116]
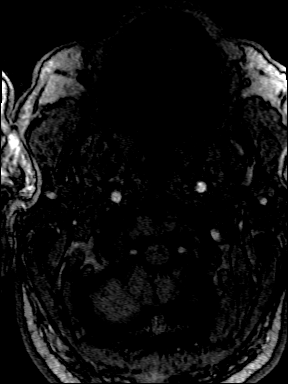

[42 of 48 positions shown; findings below may reference images not displayed]

FINDINGS: Patient has an elevated creatinine and therefore MR angiogram
performed without contrast and CT angiogram not ordered.

The present noncontrast examination was tailored to specifically
evaluate the carotid bifurcation. The entire course of the carotid
arteries and vertebral arteries were not imaged.

Right carotid bifurcation: Irregularity of the of proximal right
internal carotid artery. Small ulceration may be present. Just
beyond this region, 50% diameter stenosis of the right internal
carotid artery 1.5 cm above its origin. Ectatic mid to distal
cervical segment of the right internal carotid artery. Mild
narrowing proximal right external carotid.

Left carotid bifurcation: Mild irregularity of the left internal
carotid artery without measurable stenosis. 50% diameter stenosis
proximal left external carotid artery.

Artifact extends through portion of the vertebral artery greater on
the right. Vertebral arteries are of similar caliber. Mild ectasia
and and irregularity of the vertebral arteries at the C1 -2 level.
IMPRESSION: Patient has an elevated creatinine and therefore MR angiogram
performed without contrast and CT angiogram not ordered.

The present noncontrast examination was tailored to specifically
evaluate the carotid bifurcation. The entire course of the carotid
arteries and vertebral arteries were not imaged.

Right carotid bifurcation: Irregularity of the of proximal right
internal carotid artery. Small ulceration may be present. Just
beyond this region, 50% diameter stenosis of the right internal
carotid artery 1.5 cm above its origin. Ectatic mid to distal
cervical segment of the right internal carotid artery.

Left carotid bifurcation: Mild irregularity of the left internal
carotid artery without measurable stenosis.

Vertebral arteries are of similar caliber. Mild ectasia and and
irregularity of the vertebral arteries at the C1 -2 level.

## 2018-04-27 ENCOUNTER — Inpatient Hospital Stay: Payer: Medicare HMO

## 2018-04-27 DIAGNOSIS — D708 Other neutropenia: Secondary | ICD-10-CM | POA: Diagnosis not present

## 2018-04-27 MED ORDER — TBO-FILGRASTIM 480 MCG/0.8ML ~~LOC~~ SOSY
480.0000 ug | PREFILLED_SYRINGE | Freq: Once | SUBCUTANEOUS | Status: AC
  Administered 2018-04-27: 480 ug via SUBCUTANEOUS

## 2018-05-03 ENCOUNTER — Inpatient Hospital Stay: Payer: Medicare HMO

## 2018-05-04 ENCOUNTER — Other Ambulatory Visit: Payer: Medicare HMO

## 2018-05-11 ENCOUNTER — Inpatient Hospital Stay: Payer: Medicare HMO

## 2018-05-11 ENCOUNTER — Inpatient Hospital Stay: Payer: Medicare HMO | Attending: Internal Medicine

## 2018-05-11 DIAGNOSIS — D708 Other neutropenia: Secondary | ICD-10-CM | POA: Insufficient documentation

## 2018-05-11 LAB — CBC WITH DIFFERENTIAL/PLATELET
BASOS ABS: 0 10*3/uL (ref 0–0.1)
Basophils Relative: 2 %
EOS PCT: 1 %
Eosinophils Absolute: 0 10*3/uL (ref 0–0.7)
HCT: 34.6 % — ABNORMAL LOW (ref 40.0–52.0)
Hemoglobin: 11.9 g/dL — ABNORMAL LOW (ref 13.0–18.0)
LYMPHS PCT: 34 %
Lymphs Abs: 0.5 10*3/uL — ABNORMAL LOW (ref 1.0–3.6)
MCH: 29.2 pg (ref 26.0–34.0)
MCHC: 34.3 g/dL (ref 32.0–36.0)
MCV: 85.1 fL (ref 80.0–100.0)
MONO ABS: 0.7 10*3/uL (ref 0.2–1.0)
Monocytes Relative: 53 %
Neutro Abs: 0.1 10*3/uL — ABNORMAL LOW (ref 1.4–6.5)
Neutrophils Relative %: 10 %
PLATELETS: 189 10*3/uL (ref 150–440)
RBC: 4.06 MIL/uL — ABNORMAL LOW (ref 4.40–5.90)
RDW: 14.5 % (ref 11.5–14.5)
WBC: 1.4 10*3/uL — CL (ref 3.8–10.6)

## 2018-05-11 MED ORDER — TBO-FILGRASTIM 480 MCG/0.8ML ~~LOC~~ SOSY
480.0000 ug | PREFILLED_SYRINGE | Freq: Once | SUBCUTANEOUS | Status: AC
  Administered 2018-05-11: 480 ug via SUBCUTANEOUS

## 2018-05-17 ENCOUNTER — Emergency Department
Admission: EM | Admit: 2018-05-17 | Discharge: 2018-05-17 | Disposition: A | Discharge status: Home / Self Care | Payer: Medicare HMO | Attending: Emergency Medicine | Admitting: Emergency Medicine

## 2018-05-17 ENCOUNTER — Emergency Department: Payer: Medicare HMO

## 2018-05-17 ENCOUNTER — Encounter: Payer: Self-pay | Admitting: Emergency Medicine

## 2018-05-17 ENCOUNTER — Other Ambulatory Visit: Payer: Self-pay

## 2018-05-17 DIAGNOSIS — I2510 Atherosclerotic heart disease of native coronary artery without angina pectoris: Secondary | ICD-10-CM | POA: Diagnosis not present

## 2018-05-17 DIAGNOSIS — Z7902 Long term (current) use of antithrombotics/antiplatelets: Secondary | ICD-10-CM | POA: Insufficient documentation

## 2018-05-17 DIAGNOSIS — Z87891 Personal history of nicotine dependence: Secondary | ICD-10-CM | POA: Insufficient documentation

## 2018-05-17 DIAGNOSIS — R197 Diarrhea, unspecified: Secondary | ICD-10-CM | POA: Insufficient documentation

## 2018-05-17 DIAGNOSIS — Z7984 Long term (current) use of oral hypoglycemic drugs: Secondary | ICD-10-CM | POA: Insufficient documentation

## 2018-05-17 DIAGNOSIS — Z8673 Personal history of transient ischemic attack (TIA), and cerebral infarction without residual deficits: Secondary | ICD-10-CM | POA: Diagnosis not present

## 2018-05-17 DIAGNOSIS — R112 Nausea with vomiting, unspecified: Secondary | ICD-10-CM | POA: Insufficient documentation

## 2018-05-17 DIAGNOSIS — R1010 Upper abdominal pain, unspecified: Secondary | ICD-10-CM | POA: Diagnosis not present

## 2018-05-17 DIAGNOSIS — Z79899 Other long term (current) drug therapy: Secondary | ICD-10-CM | POA: Diagnosis not present

## 2018-05-17 DIAGNOSIS — Z955 Presence of coronary angioplasty implant and graft: Secondary | ICD-10-CM | POA: Diagnosis not present

## 2018-05-17 DIAGNOSIS — E119 Type 2 diabetes mellitus without complications: Secondary | ICD-10-CM | POA: Insufficient documentation

## 2018-05-17 DIAGNOSIS — I10 Essential (primary) hypertension: Secondary | ICD-10-CM | POA: Diagnosis not present

## 2018-05-17 DIAGNOSIS — K529 Noninfective gastroenteritis and colitis, unspecified: Secondary | ICD-10-CM | POA: Diagnosis not present

## 2018-05-17 HISTORY — DX: Heart failure, unspecified: I50.9

## 2018-05-17 HISTORY — DX: Disorder of kidney and ureter, unspecified: N28.9

## 2018-05-17 LAB — COMPREHENSIVE METABOLIC PANEL
ALBUMIN: 3 g/dL — AB (ref 3.5–5.0)
ALT: 98 U/L — AB (ref 17–63)
AST: 73 U/L — AB (ref 15–41)
Alkaline Phosphatase: 112 U/L (ref 38–126)
Anion gap: 11 (ref 5–15)
BUN: 57 mg/dL — AB (ref 6–20)
CO2: 20 mmol/L — AB (ref 22–32)
CREATININE: 1.59 mg/dL — AB (ref 0.61–1.24)
Calcium: 9.2 mg/dL (ref 8.9–10.3)
Chloride: 106 mmol/L (ref 101–111)
GFR calc Af Amer: 48 mL/min — ABNORMAL LOW (ref >60)
GFR calc non Af Amer: 41 mL/min — ABNORMAL LOW (ref >60)
Glucose, Bld: 159 mg/dL — ABNORMAL HIGH (ref 65–99)
POTASSIUM: 4.1 mmol/L (ref 3.5–5.1)
SODIUM: 137 mmol/L (ref 135–145)
Total Bilirubin: 1.1 mg/dL (ref 0.3–1.2)
Total Protein: 6.8 g/dL (ref 6.5–8.1)

## 2018-05-17 LAB — CBC
HEMATOCRIT: 38 % — AB (ref 40.0–52.0)
Hemoglobin: 13.1 g/dL (ref 13.0–18.0)
MCH: 29 pg (ref 26.0–34.0)
MCHC: 34.3 g/dL (ref 32.0–36.0)
MCV: 84.4 fL (ref 80.0–100.0)
PLATELETS: 214 10*3/uL (ref 150–440)
RBC: 4.5 MIL/uL (ref 4.40–5.90)
RDW: 15.1 % — AB (ref 11.5–14.5)
WBC: 1.2 10*3/uL — AB (ref 3.8–10.6)

## 2018-05-17 LAB — GLUCOSE, CAPILLARY: Glucose-Capillary: 144 mg/dL — ABNORMAL HIGH (ref 65–99)

## 2018-05-17 LAB — LIPASE, BLOOD: LIPASE: 30 U/L (ref 11–51)

## 2018-05-17 LAB — TROPONIN I: Troponin I: <0.03 ng/mL (ref <0.03)

## 2018-05-17 MED ORDER — AMOXICILLIN-POT CLAVULANATE ER 1000-62.5 MG PO TB12: 1.0000 | ORAL_TABLET | Freq: Two times a day (BID) | ORAL | 0 refills | Status: AC

## 2018-05-17 MED ORDER — IOHEXOL 300 MG/ML  SOLN
75.0000 mL | Freq: Once | INTRAMUSCULAR | Status: AC | PRN
  Administered 2018-05-17: 75 mL via INTRAVENOUS

## 2018-05-17 MED ORDER — AMOXICILLIN-POT CLAVULANATE 875-125 MG PO TABS
1.0000 | ORAL_TABLET | Freq: Once | ORAL | Status: AC
  Administered 2018-05-17: 1 via ORAL
  Filled 2018-05-17: qty 1

## 2018-05-17 NOTE — ED Notes (Signed)
MD at bedside with patient and patient's family. 

## 2018-05-17 NOTE — ED Notes (Addendum)
Pt to room 3 via w/c by pt relations rep Alfredia ClientMary Jo; report called to care nurse Carollee HerterShannon, RN; notified of WBC and pending troponin

## 2018-05-17 NOTE — ED Provider Notes (Signed)
[facility] Emergency Department Provider Note   ____________________________________________   I have reviewed the triage vital signs and the nursing notes.   HISTORY  Chief Complaint Diarrhea; Abdominal Pain; and Emesis   History limited by: Not Limited, some history also obtained from family   HPI Brandon Wilkerson is a 77 y.o. male who presents to the emergency department today because of concerns for continued diarrhea and now vomiting.  Patient states that he has had diarrhea for roughly 11 days.  For the past week it has been worse.  Initially was quite dark although as the days progressed has become lighter.  He has seen his primary care doctor for this.  He has been given IV fluids.  Patient states that the vomiting aspect just started today.  This has all been accompanied by some upper abdominal discomfort.  The patient denies similar symptoms in the past.  Denies any history of any intestinal disorders.   Per medical record review patient has a history of DM  Past Medical History:  Diagnosis Date  . Arthritis   . Chronic leukopenia   . Diabetes mellitus without complication ([place])   . Hypertension   . PVD (peripheral vascular disease) ([place])   . Rheumatic aortic stenosis   . Sleep apnea     Patient Active Problem List   Diagnosis Date Noted  . Other neutropenia ([place]) 10/08/2016  . Unstable angina ([place]) 04/26/2016  . AKI (acute kidney injury) ([place]) 04/26/2016  . HTN (hypertension) 04/26/2016  . Type 2 diabetes mellitus ([place]) 04/26/2016  . CAD (coronary artery disease) 04/26/2016  . TIA (transient ischemic attack) 04/26/2016  . Neutropenia ([place]) 05/10/2013    Past Surgical History:  Procedure Laterality Date  . APPENDECTOMY    . CARDIAC CATHETERIZATION Right 04/27/2016   Procedure: Left Heart Cath and Coronary Angiography;  Surgeon: Dionisio David, MD;  Location: [facility] CV [facility];  Service: Cardiovascular;  Laterality: Right;  For  04/27/16 am  . CARDIAC CATHETERIZATION N/A 04/27/2016   Procedure: Coronary Stent Intervention;  Surgeon: Yolonda Kida, MD;  Location: [place] CV LAB;  Service: Cardiovascular;  Laterality: N/A;  . CORONARY ANGIOPLASTY WITH STENT PLACEMENT      Prior to Admission medications   Medication Sig Start Date End Date Taking? Authorizing Provider  amLODipine (NORVASC) 10 MG tablet Take 10 mg by mouth daily.    [provider]  aspirin 81 MG tablet Take 81 mg by mouth daily.    [provider]  atorvastatin (LIPITOR) 80 MG tablet Take 80 mg by mouth daily.    [provider]  carvedilol (COREG) 6.25 MG tablet Take 12.5 mg by mouth 2 (two) times daily with a meal.     [provider]  cloNIDine (CATAPRES) 0.3 MG tablet Take 0.3 mg by mouth 2 (two) times daily.    [provider]  clopidogrel (PLAVIX) 75 MG tablet Take 75 mg by mouth daily.    [provider]  ferrous sulfate 325 (65 FE) MG EC tablet Take 325 mg by mouth daily.    [provider]  fluticasone (FLONASE) 50 MCG/ACT nasal spray Place 1 spray into both nostrils daily.    [provider]  gabapentin (NEURONTIN) 300 MG capsule Take 1 capsule (300 mg total) by mouth 3 (three) times daily. Patient taking differently: Take 600 mg by mouth 2 (two) times daily.  04/28/16   Gladstone Lighter, MD  glipiZIDE (GLUCOTROL) 5 MG tablet Take 2.5  mg by mouth 2 (two) times daily.    [provider]  isosorbide mononitrate (IMDUR) 60 MG 24 hr tablet Take 60 mg by mouth daily.    [provider]  JARDIANCE 25 MG TABS tablet  02/01/18   [provider]  levofloxacin (LEVAQUIN) 500 MG tablet Take 1 tablet (500 mg total) by mouth daily. Call MD before taking the anti-biotic Patient not taking: Reported on 02/02/2018 10/27/17   Cammie Sickle, MD  nitroGLYCERIN (NITROSTAT) 0.4 MG SL tablet Place 0.4 mg under the tongue every 5 (five) minutes as needed  for chest pain.    [provider]  ONE TOUCH ULTRA TEST test strip use as directed;  twice a day OR MORE if needed 11/08/15   [provider]  polyethylene glycol (MIRALAX) packet Take 17 g by mouth daily. 08/22/17   Loney Hering, MD  Vitamin D, Ergocalciferol, (DRISDOL) 50000 units CAPS capsule Take 50,000 Units by mouth every 30 (thirty) days. 03/19/17   [provider]    Allergies Pollen extract  Family History  Problem Relation Age of Onset  . Diabetes Unknown   . Heart disease Unknown   . Hypertension Unknown   . Breast cancer Unknown     Social History Social History   Tobacco Use  . Smoking status: Former Smoker    Types: Cigarettes    Last attempt to quit: 11/22/1979    Years since quitting: 38.5  . Smokeless tobacco: Never Used  Substance Use Topics  . Alcohol use: No  . Drug use: No    Review of Systems Constitutional: No fever/chills Eyes: No visual changes. ENT: No sore throat. Cardiovascular: Denies chest pain. Respiratory: Denies shortness of breath. Gastrointestinal: Positive for abdominal pain, nausea vomiting and diarrhea.  Genitourinary: Negative for dysuria. Musculoskeletal: Negative for back pain. Skin: Negative for rash. Neurological: Negative for headaches, focal weakness or numbness.  ____________________________________________   PHYSICAL EXAM:  VITAL SIGNS: ED Triage Vitals  Enc Vitals Group     BP 05/17/18 1838 115/66     Pulse Rate 05/17/18 1838 85     Resp 05/17/18 1838 18     Temp 05/17/18 1838 98 F (36.7 C)     Temp Source 05/17/18 1838 Oral     SpO2 05/17/18 1838 99 %     Weight 05/17/18 1839 212 lb (96.2 kg)     Height 05/17/18 1839 6' 1.5" (1.867 m)     Head Circumference --      Peak Flow --      Pain Score 05/17/18 1839 7   Constitutional: Alert and oriented.  Eyes: Conjunctivae are normal.  ENT      Head: Normocephalic and atraumatic.      Nose: No congestion/rhinnorhea.       Mouth/Throat: Mucous membranes are moist.      Neck: No stridor. Hematological/Lymphatic/Immunilogical: No cervical lymphadenopathy. Cardiovascular: Normal rate, regular rhythm.  No murmurs, rubs, or gallops.  Respiratory: Normal respiratory effort without tachypnea nor retractions. Breath sounds are clear and equal bilaterally. No wheezes/rales/rhonchi. Gastrointestinal: Soft and non tender. No rebound. No guarding.  Genitourinary: Deferred Musculoskeletal: Normal range of motion in all extremities. No lower extremity edema. Neurologic:  Normal speech and language. No gross focal neurologic deficits are appreciated.  Skin:  Skin is warm, dry and intact. No rash noted. Psychiatric: Mood and affect are normal. Speech and behavior are normal. Patient exhibits appropriate insight and judgment.  ____________________________________________    LABS (pertinent positives/negatives)  Lipase 30 CMP na 137, k 4.1, glu 159, cr 1.59, ast 73, alt 98, alk phos 112, bili totl 1.1 CBC wbc 1.2, hgb 13.1, plt 214  ____________________________________________   EKG  I, Nance Pear, attending physician, personally viewed and interpreted this EKG  EKG Time: 1852 Rate: 70 Rhythm: normal sinus rhythm Axis: normal Intervals: qtc 429 QRS: narrow ST changes: no st elevation Impression: normal ekg   ____________________________________________    RADIOLOGY  CT abd/pel Concern for bowel wall thickening, colitis, enteritis.   ____________________________________________   PROCEDURES  Procedures  ____________________________________________   INITIAL IMPRESSION / ASSESSMENT AND PLAN / ED COURSE  Pertinent labs & imaging results that were available during my care of the patient were reviewed by me and considered in my medical decision making (see chart for details).   Patient presented to the emergency department today because of concerns for nausea vomiting diarrhea and abdominal  discomfort.  Differential would be broad including gastroenteritis, food poisoning, IBS, gallbladder disease, pancreatitis amongst other etiologies.  Patient's blood work without obvious etiology.  Patient has a leukopenia which is known.  CT scan was obtained given persistence of patient's symptoms over the past roughly 10 days.  CT scan is concerning for somewhat diffuse bowel wall thickening.  At this point I think infectious enteritis unlikely.  Discussed this finding with the patient.  Will place patient on antibiotics.  ____________________________________________   FINAL CLINICAL IMPRESSION(S) / ED DIAGNOSES  Final diagnoses:  Nausea vomiting and diarrhea  Enteritis     Note: This dictation was prepared with Dragon dictation. Any transcriptional errors that result from this process are unintentional     Nance Pear, MD 05/17/18 2350

## 2018-05-17 NOTE — ED Notes (Signed)
CBG 144 

## 2018-05-17 NOTE — ED Triage Notes (Signed)
Pt arrived via POV with wife was seen by cardiology yesterday and given IVF, states he wasn't better and was seen by PCP and given IVF.  Pt states he has been having diarrhea since last Tuesday.  Pt c/o abdominal pain around umbilicus.    Pt vomited x1 since last Tuesday. Diarrhea x2 today.  Pt c/o abdominal pain as 7-8/10 pt describes the pain as a burning and throbbing pain.

## 2018-05-17 NOTE — Discharge Instructions (Addendum)
Procedures - none ° °Please seek medical attention for any high fevers, chest pain, shortness of breath, change in behavior, persistent vomiting, bloody stool or any other new or concerning symptoms. ° °

## 2018-05-19 ENCOUNTER — Telehealth: Payer: Self-pay | Admitting: *Deleted

## 2018-05-19 NOTE — Telephone Encounter (Signed)
Wife called to report that patient expired 2018/11/19

## 2018-05-25 ENCOUNTER — Inpatient Hospital Stay: Payer: Medicare HMO

## 2018-05-29 NOTE — Telephone Encounter (Signed)
Brandon Wilkerson- please check of what happened that led to pt's death. Thx GB

## 2018-05-30 NOTE — Telephone Encounter (Addendum)
I called wife and she states he saw heart doctor Monday, went to ER Tuesday with diarrhea, vomiting and abdominal pain. He had was dehydrated and got IV fluids in ER. 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 She requests that Dr Donneta RombergBrahmanday call her [phone]. She is going to probate the will this morning, but will be home this afternoon.

## 2018-06-01 ENCOUNTER — Ambulatory Visit: Payer: Medicare HMO

## 2018-06-01 ENCOUNTER — Other Ambulatory Visit: Payer: Medicare HMO

## 2018-06-01 ENCOUNTER — Ambulatory Visit: Payer: Medicare HMO | Admitting: Internal Medicine

## 2018-06-06 DEATH — deceased
